# Patient Record
Sex: Female | Born: 1935 | Race: Black or African American | Hispanic: No | Smoking: Former smoker
Health system: Southern US, Community
[De-identification: ages and names within clinical notes are randomized; demographics above are authoritative.]

## PROBLEM LIST (undated history)

## (undated) DIAGNOSIS — I5022 Chronic systolic (congestive) heart failure: Secondary | ICD-10-CM

## (undated) DIAGNOSIS — R079 Chest pain, unspecified: Secondary | ICD-10-CM

## (undated) DIAGNOSIS — R05 Cough: Secondary | ICD-10-CM

## (undated) DIAGNOSIS — I2510 Atherosclerotic heart disease of native coronary artery without angina pectoris: Secondary | ICD-10-CM

## (undated) DIAGNOSIS — R053 Chronic cough: Secondary | ICD-10-CM

## (undated) DIAGNOSIS — E039 Hypothyroidism, unspecified: Secondary | ICD-10-CM

## (undated) DIAGNOSIS — I10 Essential (primary) hypertension: Secondary | ICD-10-CM

## (undated) DIAGNOSIS — I6529 Occlusion and stenosis of unspecified carotid artery: Secondary | ICD-10-CM

## (undated) DIAGNOSIS — K219 Gastro-esophageal reflux disease without esophagitis: Secondary | ICD-10-CM

## (undated) DIAGNOSIS — I442 Atrioventricular block, complete: Secondary | ICD-10-CM

## (undated) DIAGNOSIS — E785 Hyperlipidemia, unspecified: Secondary | ICD-10-CM

## (undated) HISTORY — DX: Essential (primary) hypertension: I10

## (undated) HISTORY — DX: Chronic cough: R05.3

## (undated) HISTORY — PX: LUMBAR SPINE SURGERY: SHX701

## (undated) HISTORY — DX: Gastro-esophageal reflux disease without esophagitis: K21.9

## (undated) HISTORY — DX: Hyperlipidemia, unspecified: E78.5

## (undated) HISTORY — PX: CARDIAC CATHETERIZATION: SHX172

## (undated) HISTORY — DX: Atherosclerotic heart disease of native coronary artery without angina pectoris: I25.10

## (undated) HISTORY — DX: Cough: R05

## (undated) HISTORY — DX: Atrioventricular block, complete: I44.2

## (undated) HISTORY — DX: Occlusion and stenosis of unspecified carotid artery: I65.29

## (undated) HISTORY — DX: Chest pain, unspecified: R07.9

## (undated) HISTORY — DX: Hypothyroidism, unspecified: E03.9

---

## 1987-02-11 DIAGNOSIS — I2510 Atherosclerotic heart disease of native coronary artery without angina pectoris: Secondary | ICD-10-CM

## 1987-02-11 HISTORY — DX: Atherosclerotic heart disease of native coronary artery without angina pectoris: I25.10

## 1987-02-11 HISTORY — PX: CORONARY ARTERY BYPASS GRAFT: SHX141

## 1997-10-09 ENCOUNTER — Other Ambulatory Visit: Admission: RE | Admit: 1997-10-09 | Discharge: 1997-10-09 | Payer: Self-pay | Admitting: Internal Medicine

## 1997-10-13 ENCOUNTER — Ambulatory Visit (HOSPITAL_COMMUNITY): Admission: RE | Admit: 1997-10-13 | Discharge: 1997-10-13 | Payer: Self-pay | Admitting: Internal Medicine

## 1998-05-04 ENCOUNTER — Ambulatory Visit (HOSPITAL_COMMUNITY): Admission: RE | Admit: 1998-05-04 | Discharge: 1998-05-04 | Payer: Self-pay | Admitting: Cardiovascular Disease

## 1999-04-29 ENCOUNTER — Encounter: Admission: RE | Admit: 1999-04-29 | Discharge: 1999-04-29 | Payer: Self-pay | Admitting: Internal Medicine

## 1999-04-29 ENCOUNTER — Encounter: Payer: Self-pay | Admitting: Internal Medicine

## 1999-07-24 ENCOUNTER — Ambulatory Visit (HOSPITAL_COMMUNITY): Admission: RE | Admit: 1999-07-24 | Discharge: 1999-07-24 | Payer: Self-pay | Admitting: Gastroenterology

## 2000-04-29 ENCOUNTER — Encounter: Admission: RE | Admit: 2000-04-29 | Discharge: 2000-04-29 | Payer: Self-pay | Admitting: Internal Medicine

## 2000-04-29 ENCOUNTER — Encounter: Payer: Self-pay | Admitting: Internal Medicine

## 2000-06-17 ENCOUNTER — Encounter: Payer: Self-pay | Admitting: Internal Medicine

## 2000-06-17 ENCOUNTER — Ambulatory Visit (HOSPITAL_COMMUNITY): Admission: RE | Admit: 2000-06-17 | Discharge: 2000-06-17 | Payer: Self-pay | Admitting: Internal Medicine

## 2000-11-12 ENCOUNTER — Other Ambulatory Visit: Admission: RE | Admit: 2000-11-12 | Discharge: 2000-11-12 | Payer: Self-pay | Admitting: Internal Medicine

## 2001-04-14 ENCOUNTER — Encounter: Payer: Self-pay | Admitting: Internal Medicine

## 2001-04-14 ENCOUNTER — Encounter: Admission: RE | Admit: 2001-04-14 | Discharge: 2001-04-14 | Payer: Self-pay | Admitting: Internal Medicine

## 2001-06-24 ENCOUNTER — Emergency Department (HOSPITAL_COMMUNITY): Admission: EM | Admit: 2001-06-24 | Discharge: 2001-06-24 | Payer: Self-pay

## 2001-08-30 ENCOUNTER — Encounter: Payer: Self-pay | Admitting: Ophthalmology

## 2001-09-02 ENCOUNTER — Ambulatory Visit (HOSPITAL_COMMUNITY): Admission: RE | Admit: 2001-09-02 | Discharge: 2001-09-02 | Payer: Self-pay | Admitting: Ophthalmology

## 2001-12-06 ENCOUNTER — Ambulatory Visit (HOSPITAL_COMMUNITY): Admission: RE | Admit: 2001-12-06 | Discharge: 2001-12-06 | Payer: Self-pay | Admitting: Ophthalmology

## 2002-02-10 HISTORY — PX: CATARACT EXTRACTION: SUR2

## 2002-02-10 HISTORY — PX: PACEMAKER PLACEMENT: SHX43

## 2002-04-19 ENCOUNTER — Encounter: Admission: RE | Admit: 2002-04-19 | Discharge: 2002-04-19 | Payer: Self-pay | Admitting: Internal Medicine

## 2002-04-19 ENCOUNTER — Encounter: Payer: Self-pay | Admitting: Internal Medicine

## 2002-08-25 ENCOUNTER — Inpatient Hospital Stay (HOSPITAL_COMMUNITY): Admission: AD | Admit: 2002-08-25 | Discharge: 2002-08-28 | Payer: Self-pay | Admitting: Cardiovascular Disease

## 2002-08-26 ENCOUNTER — Encounter: Payer: Self-pay | Admitting: Cardiology

## 2002-08-27 ENCOUNTER — Encounter: Payer: Self-pay | Admitting: Cardiovascular Disease

## 2003-05-08 ENCOUNTER — Encounter: Admission: RE | Admit: 2003-05-08 | Discharge: 2003-05-08 | Payer: Self-pay | Admitting: Internal Medicine

## 2003-05-24 ENCOUNTER — Emergency Department (HOSPITAL_COMMUNITY): Admission: EM | Admit: 2003-05-24 | Discharge: 2003-05-24 | Payer: Self-pay | Admitting: Emergency Medicine

## 2003-05-26 ENCOUNTER — Emergency Department (HOSPITAL_COMMUNITY): Admission: EM | Admit: 2003-05-26 | Discharge: 2003-05-26 | Payer: Self-pay | Admitting: Family Medicine

## 2004-03-19 ENCOUNTER — Encounter (INDEPENDENT_AMBULATORY_CARE_PROVIDER_SITE_OTHER): Payer: Self-pay | Admitting: Specialist

## 2004-03-19 ENCOUNTER — Ambulatory Visit (HOSPITAL_COMMUNITY): Admission: RE | Admit: 2004-03-19 | Discharge: 2004-03-19 | Payer: Self-pay | Admitting: Gastroenterology

## 2004-05-14 ENCOUNTER — Encounter: Admission: RE | Admit: 2004-05-14 | Discharge: 2004-05-14 | Payer: Self-pay | Admitting: Internal Medicine

## 2005-05-15 ENCOUNTER — Encounter: Admission: RE | Admit: 2005-05-15 | Discharge: 2005-05-15 | Payer: Self-pay | Admitting: Internal Medicine

## 2005-06-02 ENCOUNTER — Encounter: Payer: Self-pay | Admitting: Cardiovascular Disease

## 2005-06-18 ENCOUNTER — Inpatient Hospital Stay (HOSPITAL_BASED_OUTPATIENT_CLINIC_OR_DEPARTMENT_OTHER): Admission: RE | Admit: 2005-06-18 | Discharge: 2005-06-18 | Payer: Self-pay | Admitting: Cardiovascular Disease

## 2006-03-31 ENCOUNTER — Inpatient Hospital Stay (HOSPITAL_COMMUNITY): Admission: EM | Admit: 2006-03-31 | Discharge: 2006-04-01 | Payer: Self-pay | Admitting: Emergency Medicine

## 2006-04-14 ENCOUNTER — Ambulatory Visit: Payer: Self-pay | Admitting: *Deleted

## 2006-04-15 ENCOUNTER — Ambulatory Visit: Payer: Self-pay | Admitting: Family Medicine

## 2006-04-15 ENCOUNTER — Encounter (INDEPENDENT_AMBULATORY_CARE_PROVIDER_SITE_OTHER): Payer: Self-pay | Admitting: Family Medicine

## 2006-04-15 DIAGNOSIS — E785 Hyperlipidemia, unspecified: Secondary | ICD-10-CM

## 2006-04-15 DIAGNOSIS — Z950 Presence of cardiac pacemaker: Secondary | ICD-10-CM

## 2006-04-15 DIAGNOSIS — I25810 Atherosclerosis of coronary artery bypass graft(s) without angina pectoris: Secondary | ICD-10-CM

## 2006-04-15 DIAGNOSIS — H269 Unspecified cataract: Secondary | ICD-10-CM

## 2006-04-15 DIAGNOSIS — E039 Hypothyroidism, unspecified: Secondary | ICD-10-CM | POA: Insufficient documentation

## 2006-04-15 DIAGNOSIS — I119 Hypertensive heart disease without heart failure: Secondary | ICD-10-CM

## 2006-04-15 LAB — CONVERTED CEMR LAB
CO2: 26 meq/L (ref 19–32)
Chloride: 102 meq/L (ref 96–112)
Glucose, Bld: 102 mg/dL — ABNORMAL HIGH (ref 70–99)
HDL goal, serum: 40 mg/dL
Hgb A1c MFr Bld: 6.2 %
Sodium: 143 meq/L (ref 135–145)

## 2006-04-16 ENCOUNTER — Encounter (INDEPENDENT_AMBULATORY_CARE_PROVIDER_SITE_OTHER): Payer: Self-pay | Admitting: Family Medicine

## 2006-04-27 ENCOUNTER — Encounter: Payer: Self-pay | Admitting: *Deleted

## 2006-05-06 ENCOUNTER — Telehealth: Payer: Self-pay | Admitting: *Deleted

## 2006-05-07 ENCOUNTER — Telehealth (INDEPENDENT_AMBULATORY_CARE_PROVIDER_SITE_OTHER): Payer: Self-pay | Admitting: Family Medicine

## 2006-05-25 ENCOUNTER — Encounter (INDEPENDENT_AMBULATORY_CARE_PROVIDER_SITE_OTHER): Payer: Self-pay | Admitting: Family Medicine

## 2006-05-25 ENCOUNTER — Encounter: Admission: RE | Admit: 2006-05-25 | Discharge: 2006-05-25 | Payer: Self-pay | Admitting: Sports Medicine

## 2006-05-29 ENCOUNTER — Encounter (INDEPENDENT_AMBULATORY_CARE_PROVIDER_SITE_OTHER): Payer: Self-pay | Admitting: Family Medicine

## 2006-06-09 ENCOUNTER — Ambulatory Visit: Payer: Self-pay | Admitting: Family Medicine

## 2006-06-09 ENCOUNTER — Encounter (INDEPENDENT_AMBULATORY_CARE_PROVIDER_SITE_OTHER): Payer: Self-pay | Admitting: Family Medicine

## 2006-06-09 LAB — CONVERTED CEMR LAB
HDL: 39 mg/dL — ABNORMAL LOW (ref >39)
LDL Cholesterol: 63 mg/dL (ref 0–99)
Total CHOL/HDL Ratio: 3.2
Triglycerides: 107 mg/dL (ref <150)
VLDL: 21 mg/dL (ref 0–40)

## 2006-06-11 ENCOUNTER — Encounter (INDEPENDENT_AMBULATORY_CARE_PROVIDER_SITE_OTHER): Payer: Self-pay | Admitting: Family Medicine

## 2006-08-05 ENCOUNTER — Encounter (INDEPENDENT_AMBULATORY_CARE_PROVIDER_SITE_OTHER): Payer: Self-pay | Admitting: Family Medicine

## 2006-09-02 ENCOUNTER — Telehealth (INDEPENDENT_AMBULATORY_CARE_PROVIDER_SITE_OTHER): Payer: Self-pay | Admitting: *Deleted

## 2006-09-03 ENCOUNTER — Ambulatory Visit: Payer: Self-pay | Admitting: Family Medicine

## 2006-11-17 ENCOUNTER — Encounter (INDEPENDENT_AMBULATORY_CARE_PROVIDER_SITE_OTHER): Payer: Self-pay | Admitting: Family Medicine

## 2006-11-20 ENCOUNTER — Ambulatory Visit: Payer: Self-pay | Admitting: Family Medicine

## 2006-11-25 ENCOUNTER — Ambulatory Visit: Payer: Self-pay | Admitting: Family Medicine

## 2006-11-25 ENCOUNTER — Encounter (INDEPENDENT_AMBULATORY_CARE_PROVIDER_SITE_OTHER): Payer: Self-pay | Admitting: Family Medicine

## 2006-11-25 LAB — CONVERTED CEMR LAB: TSH: 1.893 microintl units/mL (ref 0.350–5.50)

## 2006-11-26 ENCOUNTER — Telehealth: Payer: Self-pay | Admitting: *Deleted

## 2006-11-27 ENCOUNTER — Encounter (INDEPENDENT_AMBULATORY_CARE_PROVIDER_SITE_OTHER): Payer: Self-pay | Admitting: Family Medicine

## 2006-12-29 ENCOUNTER — Encounter (INDEPENDENT_AMBULATORY_CARE_PROVIDER_SITE_OTHER): Payer: Self-pay | Admitting: Family Medicine

## 2007-01-26 ENCOUNTER — Ambulatory Visit: Payer: Self-pay | Admitting: Family Medicine

## 2007-01-28 ENCOUNTER — Encounter (INDEPENDENT_AMBULATORY_CARE_PROVIDER_SITE_OTHER): Payer: Self-pay | Admitting: Family Medicine

## 2007-03-24 ENCOUNTER — Encounter (INDEPENDENT_AMBULATORY_CARE_PROVIDER_SITE_OTHER): Payer: Self-pay | Admitting: Family Medicine

## 2007-03-24 DIAGNOSIS — E119 Type 2 diabetes mellitus without complications: Secondary | ICD-10-CM

## 2007-05-19 ENCOUNTER — Encounter (INDEPENDENT_AMBULATORY_CARE_PROVIDER_SITE_OTHER): Payer: Self-pay | Admitting: Family Medicine

## 2007-05-26 ENCOUNTER — Encounter: Admission: RE | Admit: 2007-05-26 | Discharge: 2007-05-26 | Payer: Self-pay | Admitting: Family Medicine

## 2007-06-09 ENCOUNTER — Encounter (INDEPENDENT_AMBULATORY_CARE_PROVIDER_SITE_OTHER): Payer: Self-pay | Admitting: Family Medicine

## 2007-06-09 ENCOUNTER — Ambulatory Visit: Payer: Self-pay | Admitting: Family Medicine

## 2007-06-09 LAB — CONVERTED CEMR LAB
ALT: 11 units/L (ref 0–35)
AST: 20 units/L (ref 0–37)
Alkaline Phosphatase: 90 units/L (ref 39–117)
BUN: 13 mg/dL (ref 6–23)
Calcium: 9.2 mg/dL (ref 8.4–10.5)
Creatinine, Ser: 0.88 mg/dL (ref 0.40–1.20)
Total Bilirubin: 0.4 mg/dL (ref 0.3–1.2)

## 2007-06-10 ENCOUNTER — Encounter (INDEPENDENT_AMBULATORY_CARE_PROVIDER_SITE_OTHER): Payer: Self-pay | Admitting: Family Medicine

## 2007-06-21 ENCOUNTER — Telehealth (INDEPENDENT_AMBULATORY_CARE_PROVIDER_SITE_OTHER): Payer: Self-pay | Admitting: Family Medicine

## 2007-06-22 ENCOUNTER — Encounter (INDEPENDENT_AMBULATORY_CARE_PROVIDER_SITE_OTHER): Payer: Self-pay | Admitting: Family Medicine

## 2007-06-28 ENCOUNTER — Telehealth: Payer: Self-pay | Admitting: *Deleted

## 2007-06-28 ENCOUNTER — Encounter (INDEPENDENT_AMBULATORY_CARE_PROVIDER_SITE_OTHER): Payer: Self-pay | Admitting: Family Medicine

## 2007-06-30 ENCOUNTER — Ambulatory Visit: Payer: Self-pay | Admitting: Family Medicine

## 2007-07-02 ENCOUNTER — Inpatient Hospital Stay (HOSPITAL_BASED_OUTPATIENT_CLINIC_OR_DEPARTMENT_OTHER): Admission: RE | Admit: 2007-07-02 | Discharge: 2007-07-02 | Payer: Self-pay | Admitting: Cardiovascular Disease

## 2007-07-16 ENCOUNTER — Encounter (INDEPENDENT_AMBULATORY_CARE_PROVIDER_SITE_OTHER): Payer: Self-pay | Admitting: Family Medicine

## 2007-07-19 ENCOUNTER — Ambulatory Visit (HOSPITAL_COMMUNITY): Admission: RE | Admit: 2007-07-19 | Discharge: 2007-07-20 | Payer: Self-pay | Admitting: Cardiovascular Disease

## 2007-08-09 ENCOUNTER — Ambulatory Visit: Payer: Self-pay | Admitting: Family Medicine

## 2007-08-16 ENCOUNTER — Ambulatory Visit: Payer: Self-pay | Admitting: Family Medicine

## 2007-08-16 DIAGNOSIS — R05 Cough: Secondary | ICD-10-CM

## 2007-08-25 ENCOUNTER — Encounter: Payer: Self-pay | Admitting: Family Medicine

## 2007-08-26 ENCOUNTER — Encounter (HOSPITAL_COMMUNITY): Admission: RE | Admit: 2007-08-26 | Discharge: 2007-11-29 | Payer: Self-pay | Admitting: Cardiovascular Disease

## 2007-09-20 ENCOUNTER — Telehealth: Payer: Self-pay | Admitting: *Deleted

## 2007-09-21 ENCOUNTER — Ambulatory Visit: Payer: Self-pay | Admitting: Family Medicine

## 2007-09-27 ENCOUNTER — Ambulatory Visit: Payer: Self-pay | Admitting: Family Medicine

## 2007-10-14 ENCOUNTER — Encounter: Payer: Self-pay | Admitting: Family Medicine

## 2007-10-14 ENCOUNTER — Ambulatory Visit: Payer: Self-pay | Admitting: Family Medicine

## 2007-10-14 LAB — CONVERTED CEMR LAB
OCCULT 1: NEGATIVE
OCCULT 2: NEGATIVE
OCCULT 3: NEGATIVE

## 2007-10-20 ENCOUNTER — Telehealth: Payer: Self-pay | Admitting: *Deleted

## 2007-10-21 ENCOUNTER — Ambulatory Visit: Payer: Self-pay | Admitting: Family Medicine

## 2007-10-22 ENCOUNTER — Telehealth (INDEPENDENT_AMBULATORY_CARE_PROVIDER_SITE_OTHER): Payer: Self-pay | Admitting: Family Medicine

## 2007-10-22 ENCOUNTER — Encounter: Admission: RE | Admit: 2007-10-22 | Discharge: 2007-10-22 | Payer: Self-pay | Admitting: Advanced Practice Midwife

## 2007-11-09 ENCOUNTER — Encounter: Payer: Self-pay | Admitting: Family Medicine

## 2007-11-12 ENCOUNTER — Ambulatory Visit: Payer: Self-pay | Admitting: Family Medicine

## 2007-11-15 ENCOUNTER — Telehealth (INDEPENDENT_AMBULATORY_CARE_PROVIDER_SITE_OTHER): Payer: Self-pay | Admitting: *Deleted

## 2007-11-23 ENCOUNTER — Encounter: Payer: Self-pay | Admitting: Family Medicine

## 2007-11-30 ENCOUNTER — Encounter (HOSPITAL_COMMUNITY): Admission: RE | Admit: 2007-11-30 | Discharge: 2008-02-10 | Payer: Self-pay | Admitting: Cardiovascular Disease

## 2007-12-13 ENCOUNTER — Ambulatory Visit: Payer: Self-pay | Admitting: Family Medicine

## 2007-12-13 ENCOUNTER — Telehealth (INDEPENDENT_AMBULATORY_CARE_PROVIDER_SITE_OTHER): Payer: Self-pay | Admitting: *Deleted

## 2007-12-24 ENCOUNTER — Ambulatory Visit: Payer: Self-pay | Admitting: Family Medicine

## 2007-12-24 LAB — CONVERTED CEMR LAB: Hgb A1c MFr Bld: 5.9 %

## 2008-01-26 ENCOUNTER — Ambulatory Visit: Payer: Self-pay | Admitting: Family Medicine

## 2008-01-26 ENCOUNTER — Telehealth: Payer: Self-pay | Admitting: *Deleted

## 2008-02-11 ENCOUNTER — Encounter (HOSPITAL_COMMUNITY): Admission: RE | Admit: 2008-02-11 | Discharge: 2008-04-10 | Payer: Self-pay | Admitting: Cardiovascular Disease

## 2008-05-26 ENCOUNTER — Encounter: Admission: RE | Admit: 2008-05-26 | Discharge: 2008-05-26 | Payer: Self-pay | Admitting: Family Medicine

## 2008-07-11 ENCOUNTER — Encounter: Payer: Self-pay | Admitting: Family Medicine

## 2008-07-12 ENCOUNTER — Telehealth: Payer: Self-pay | Admitting: *Deleted

## 2008-07-12 ENCOUNTER — Ambulatory Visit: Payer: Self-pay | Admitting: Family Medicine

## 2008-07-12 LAB — CONVERTED CEMR LAB: Hgb A1c MFr Bld: 6 %

## 2008-07-27 ENCOUNTER — Ambulatory Visit: Payer: Self-pay | Admitting: Family Medicine

## 2008-08-01 ENCOUNTER — Ambulatory Visit: Payer: Self-pay | Admitting: Internal Medicine

## 2008-08-01 DIAGNOSIS — I219 Acute myocardial infarction, unspecified: Secondary | ICD-10-CM | POA: Insufficient documentation

## 2008-08-31 ENCOUNTER — Ambulatory Visit: Payer: Self-pay | Admitting: Family Medicine

## 2008-09-07 ENCOUNTER — Encounter: Payer: Self-pay | Admitting: Family Medicine

## 2008-09-22 ENCOUNTER — Ambulatory Visit: Payer: Self-pay | Admitting: Internal Medicine

## 2008-11-14 ENCOUNTER — Ambulatory Visit: Payer: Self-pay | Admitting: Family Medicine

## 2008-11-14 ENCOUNTER — Telehealth: Payer: Self-pay | Admitting: Family Medicine

## 2008-11-16 ENCOUNTER — Ambulatory Visit: Payer: Self-pay | Admitting: Internal Medicine

## 2008-11-16 ENCOUNTER — Encounter: Payer: Self-pay | Admitting: Family Medicine

## 2008-11-17 ENCOUNTER — Encounter: Admission: RE | Admit: 2008-11-17 | Discharge: 2008-11-17 | Payer: Self-pay | Admitting: Urology

## 2008-12-18 ENCOUNTER — Encounter: Payer: Self-pay | Admitting: Family Medicine

## 2008-12-18 ENCOUNTER — Ambulatory Visit: Payer: Self-pay | Admitting: Family Medicine

## 2008-12-18 LAB — CONVERTED CEMR LAB
AST: 21 units/L (ref 0–37)
BUN: 11 mg/dL (ref 6–23)
Calcium: 9 mg/dL (ref 8.4–10.5)
Chloride: 102 meq/L (ref 96–112)
Creatinine, Ser: 0.85 mg/dL (ref 0.40–1.20)
TSH: 2.808 microintl units/mL (ref 0.350–4.500)
Total Bilirubin: 0.5 mg/dL (ref 0.3–1.2)

## 2008-12-19 ENCOUNTER — Encounter: Payer: Self-pay | Admitting: Family Medicine

## 2009-01-01 ENCOUNTER — Ambulatory Visit: Payer: Self-pay | Admitting: Internal Medicine

## 2009-02-10 DIAGNOSIS — I6529 Occlusion and stenosis of unspecified carotid artery: Secondary | ICD-10-CM

## 2009-02-10 HISTORY — DX: Occlusion and stenosis of unspecified carotid artery: I65.29

## 2009-05-28 ENCOUNTER — Encounter: Payer: Self-pay | Admitting: Family Medicine

## 2009-05-28 ENCOUNTER — Encounter: Admission: RE | Admit: 2009-05-28 | Discharge: 2009-05-28 | Payer: Self-pay | Admitting: Family Medicine

## 2009-05-29 ENCOUNTER — Encounter: Payer: Self-pay | Admitting: Family Medicine

## 2009-05-29 DIAGNOSIS — R0989 Other specified symptoms and signs involving the circulatory and respiratory systems: Secondary | ICD-10-CM | POA: Insufficient documentation

## 2009-06-07 ENCOUNTER — Ambulatory Visit: Payer: Self-pay | Admitting: Surgery

## 2009-06-07 ENCOUNTER — Encounter: Payer: Self-pay | Admitting: Family Medicine

## 2009-06-07 ENCOUNTER — Ambulatory Visit (HOSPITAL_COMMUNITY): Admission: RE | Admit: 2009-06-07 | Discharge: 2009-06-07 | Payer: Self-pay | Admitting: Family Medicine

## 2009-06-11 ENCOUNTER — Telehealth: Payer: Self-pay | Admitting: Family Medicine

## 2009-06-14 ENCOUNTER — Encounter: Payer: Self-pay | Admitting: Family Medicine

## 2009-07-24 ENCOUNTER — Encounter: Payer: Self-pay | Admitting: Family Medicine

## 2009-08-07 ENCOUNTER — Ambulatory Visit: Payer: Self-pay | Admitting: Family Medicine

## 2009-08-07 DIAGNOSIS — R209 Unspecified disturbances of skin sensation: Secondary | ICD-10-CM | POA: Insufficient documentation

## 2009-08-07 DIAGNOSIS — I739 Peripheral vascular disease, unspecified: Secondary | ICD-10-CM

## 2009-08-07 DIAGNOSIS — I779 Disorder of arteries and arterioles, unspecified: Secondary | ICD-10-CM | POA: Insufficient documentation

## 2009-08-07 DIAGNOSIS — G4700 Insomnia, unspecified: Secondary | ICD-10-CM | POA: Insufficient documentation

## 2009-08-09 ENCOUNTER — Encounter: Payer: Self-pay | Admitting: Family Medicine

## 2009-09-17 ENCOUNTER — Ambulatory Visit: Payer: Self-pay | Admitting: Surgery

## 2009-09-25 ENCOUNTER — Encounter: Payer: Self-pay | Admitting: Family Medicine

## 2009-09-25 LAB — CONVERTED CEMR LAB
ALT: 10 units/L
AST: 23 units/L
Bilirubin, Direct: 0.1 mg/dL
Chloride: 99 meq/L
Glucose, Bld: 85 mg/dL
HCT: 37.9 %
Hemoglobin: 12.7 g/dL
Indirect Bilirubin: 0.4 mg/dL
MCHC: 33.5 g/dL
Platelets: 281 10*3/uL
Potassium: 4.1 meq/L
Sodium: 138 meq/L

## 2009-09-26 ENCOUNTER — Encounter: Payer: Self-pay | Admitting: Family Medicine

## 2009-11-23 ENCOUNTER — Ambulatory Visit: Payer: Self-pay | Admitting: Family Medicine

## 2009-11-24 ENCOUNTER — Encounter: Payer: Self-pay | Admitting: Internal Medicine

## 2009-12-19 ENCOUNTER — Telehealth: Payer: Self-pay | Admitting: Family Medicine

## 2009-12-24 ENCOUNTER — Ambulatory Visit: Payer: Self-pay | Admitting: Cardiovascular Disease

## 2009-12-24 ENCOUNTER — Encounter: Payer: Self-pay | Admitting: Family Medicine

## 2009-12-24 LAB — CONVERTED CEMR LAB
HDL: 56 mg/dL
Triglycerides: 83 mg/dL

## 2010-01-08 ENCOUNTER — Encounter: Payer: Self-pay | Admitting: Family Medicine

## 2010-01-14 ENCOUNTER — Ambulatory Visit: Payer: Self-pay | Admitting: Internal Medicine

## 2010-02-21 ENCOUNTER — Ambulatory Visit
Admission: RE | Admit: 2010-02-21 | Discharge: 2010-02-21 | Payer: Self-pay | Source: Home / Self Care | Attending: Family Medicine | Admitting: Family Medicine

## 2010-02-21 ENCOUNTER — Ambulatory Visit (HOSPITAL_COMMUNITY)
Admission: RE | Admit: 2010-02-21 | Discharge: 2010-02-21 | Payer: Self-pay | Source: Home / Self Care | Admitting: Family Medicine

## 2010-02-21 ENCOUNTER — Encounter: Payer: Self-pay | Admitting: Sports Medicine

## 2010-02-21 ENCOUNTER — Other Ambulatory Visit: Payer: Self-pay

## 2010-02-21 DIAGNOSIS — R079 Chest pain, unspecified: Secondary | ICD-10-CM | POA: Insufficient documentation

## 2010-03-08 ENCOUNTER — Encounter: Payer: Self-pay | Admitting: Family Medicine

## 2010-03-08 ENCOUNTER — Ambulatory Visit: Admission: RE | Admit: 2010-03-08 | Discharge: 2010-03-08 | Payer: Self-pay | Source: Home / Self Care

## 2010-03-08 ENCOUNTER — Encounter
Admission: RE | Admit: 2010-03-08 | Discharge: 2010-03-08 | Payer: Self-pay | Source: Home / Self Care | Attending: Family Medicine | Admitting: Family Medicine

## 2010-03-08 DIAGNOSIS — M899 Disorder of bone, unspecified: Secondary | ICD-10-CM | POA: Insufficient documentation

## 2010-03-08 DIAGNOSIS — R634 Abnormal weight loss: Secondary | ICD-10-CM | POA: Insufficient documentation

## 2010-03-08 DIAGNOSIS — R61 Generalized hyperhidrosis: Secondary | ICD-10-CM | POA: Insufficient documentation

## 2010-03-08 DIAGNOSIS — M949 Disorder of cartilage, unspecified: Secondary | ICD-10-CM

## 2010-03-08 LAB — CONVERTED CEMR LAB
ALT: 22 units/L
AST: 30 units/L
Alkaline Phosphatase: 109 units/L
Creatinine, Ser: 0.9 mg/dL
Total Bilirubin: 0.4 mg/dL

## 2010-03-11 ENCOUNTER — Telehealth: Payer: Self-pay | Admitting: Family Medicine

## 2010-03-11 LAB — CONVERTED CEMR LAB
CO2: 27 meq/L (ref 19–32)
Calcium: 9.1 mg/dL (ref 8.4–10.5)
Chloride: 100 meq/L (ref 96–112)
Creatinine, Ser: 1.03 mg/dL (ref 0.40–1.20)
Glucose, Bld: 82 mg/dL (ref 70–99)
Sodium: 136 meq/L (ref 135–145)

## 2010-03-14 ENCOUNTER — Telehealth: Payer: Self-pay | Admitting: Family Medicine

## 2010-03-14 NOTE — Miscellaneous (Signed)
Summary: labs  Clinical Lists Changes  Orders: Added new Test order of A1C-[facility] [phone]) - Signed Added new Test order of Direct LDL-[facility] [id number]) - Signed

## 2010-03-14 NOTE — Consult Note (Signed)
Summary: [facility]   Imported By: De Nurse 06/29/2009 15:58:07  _____________________________________________________________________  External Attachment:    Type:   Image     Comment:   External Document

## 2010-03-14 NOTE — Procedures (Signed)
Summary: pacer check/medtronic   Current Medications (verified): 1)  Amlodipine Besylate 10 Mg Tabs (Amlodipine Besylate) .... One By Mouth Daily 2)  Aspir-81 81 Mg Tbec (Aspirin) .... Take 1 Tablet By Mouth Every Morning 3)  Metformin Hcl 500 Mg Tabs (Metformin Hcl) .... Take 1/2  Tablet By Mouth Once A Day 4)  Potassium Chloride Cr 20 Meq Tbcr (Potassium Chloride) .... Take 1 Tablet By Mouth Every Morning 5)  Synthroid 10 Mcg Tabs (Levothyroxine Sodium) .... Take 1 Tablet By Mouth Once A Day 6)  Zocor 40 Mg Tabs (Simvastatin) .... Take 1 Tablet By Mouth At Bedtime 7)  Plavix 75 Mg  Tabs (Clopidogrel Bisulfate) .Marland Kitchen.. 1 By Mouth Daily (Nasher Prescribes) 8)  Ranexa 500 Mg  Tb12 (Ranolazine) .Marland Kitchen.. 1 By Mouth Two Times A Day (Nasher Prescribes) 9)  Imdur 60 Mg Xr24h-Tab (Isosorbide Mononitrate) .... 1/2 Tablet By Mouth Daily 10)  Furosemide 40 Mg Tabs (Furosemide) .Marland Kitchen.. 1 By Mouth Daily As Needed Swelling 11)  Bystolic 10 Mg  Tabs (Nebivolol Hcl) .... One Tablet Daily 12)  Trazodone Hcl 50 Mg Tabs (Trazodone Hcl) .... Take 1 Tablet At Bedtime As Needed For Sleep  Allergies (verified): No Known Drug Allergies  PPM Specifications Following MD:  Hillis Range, MD     Referring MD:  Chip Boer PPM Vendor:  Medtronic     PPM Model Number:  [id number]     PPM Serial Number:  [id number] PPM DOI:  08/26/2002      Lead 1    Location: RA     DOI: 08/26/2002     Model #: [id number]     Serial #: [id number]     Status: active Lead 2    Location: RV     DOI: 08/26/2002     Model #: [id number]     Serial #: [id number]     Status: active  Magnet Response Rate:  BOL 85 ERI 65  Indications:  CHB   PPM Follow Up Remote Check?  No Battery Voltage:  2.69 V     Battery Est. Longevity:  15 months     Pacer Dependent:  Yes       PPM Device Measurements Atrium  Amplitude: 1.4 mV, Impedance: 535 ohms, Threshold: 0.75 V at 0.4 msec Right Ventricle  Impedance: 722 ohms, Threshold: 0.75 V at 0.4 msec  Episodes MS Episodes:   0     Percent Mode Switch:  0     Coumadin:  No Ventricular High Rate:  0     Atrial Pacing:  93%     Ventricular Pacing:  99.9%  Parameters Mode:  DDDR     Lower Rate Limit:  60     Upper Rate Limit:  130 Paced AV Delay:  250     Sensed AV Delay:  200 Next Cardiology Appt Due:  04/11/2010 Tech Comments:  RV reprogrammed 2.5@ 0.4.  Battery nearing ERI.  ROV 3 months with Dr. Johney Frame. Altha Harm, LPN  January 14, 2010 12:16 PM

## 2010-03-14 NOTE — Progress Notes (Signed)
Summary: refill  Phone Note Refill Request Call back at Home Phone [phone] Message from:  Patient  Refills Requested: Medication #1:  SYNTHROID 50 MCG TABS Take 1 tablet by mouth once a day [facility] [place]  Initial call taken by: De Nurse,  December 19, 2009 2:58 PM    Prescriptions: SYNTHROID 50 MCG TABS (LEVOTHYROXINE SODIUM) Take 1 tablet by mouth once a day  #30 x 1   Entered and Authorized by:   Milinda Antis MD   Signed by:   Milinda Antis MD on 12/19/2009   Method used:   Electronically to        [facility] [address] (retail)       [address]       [place], [place]  [id number]       Ph: [phone]       Fax: [phone]   RxID:   [id number]

## 2010-03-14 NOTE — Cardiovascular Report (Signed)
Summary: Office Visit   Office Visit   Imported By: Roderic Ovens 01/21/2010 13:16:55  _____________________________________________________________________  External Attachment:    Type:   Image     Comment:   External Document

## 2010-03-14 NOTE — Assessment & Plan Note (Signed)
Summary: night sweats/eo   Vital Signs:  Patient profile:   77 year old female Height:      62 inches Weight:      136.2 pounds Temp:     97.6 degrees F oral Pulse rate:   82 / minute BP sitting:   134 / 62  (right arm)  Vitals Entered By: Arlyss Repress CMA, (March 08, 2010 10:44 AM) CC: night sweats x 1 mos, DM Is Patient Diabetic? Yes Pain Assessment Patient in pain? no        Primary Care Provider:  Milinda Antis MD  CC:  night sweats x 1 mos and DM.  History of Present Illness:    Night sweats x 1 month, started before christmas, and has been on and off, unsure if any changes due to foods and no new medications, clothing and sheets are drenched, only had 1 night this week without symptoms, has had in the past. Sleep improved no meds used    --  no cough, no fever or chills, weight down 3lbs, Bowels occ blood in stools after 3-4 BM approx 1 year , no dysuria, no blood , no bleeding from vagina, no recent travel outside of country, daughter having Chemo for breast cancer , has been traveling to [facility]  Does not want another colonscopy  DM- no hypoglycemic events, tolearting metformin , CBG none > 200, follows with podiatrist  Reviewd cardiology note  Habits & Providers  Alcohol-Tobacco-Diet     Tobacco Status: quit  Current Medications (verified): 1)  Amlodipine Besylate 10 Mg Tabs (Amlodipine Besylate) .... One By Mouth Daily 2)  Aspir-81 81 Mg Tbec (Aspirin) .... Take 1 Tablet By Mouth Every Morning 3)  Metformin Hcl 500 Mg Tabs (Metformin Hcl) .... Take 1/2  Tablet By Mouth Once A Day 4)  Potassium Chloride Cr 20 Meq Tbcr (Potassium Chloride) .... Take 1 Tablet By Mouth Every Morning 5)  Synthroid 10 Mcg Tabs (Levothyroxine Sodium) .... Take 1 Tablet By Mouth Once A Day 6)  Zocor 40 Mg Tabs (Simvastatin) .... Take 1 Tablet By Mouth At Bedtime 7)  Plavix 75 Mg  Tabs (Clopidogrel Bisulfate) .Marland Kitchen.. 1 By Mouth Daily (Nasher Prescribes) 8)  Ranexa 500 Mg   Tb12 (Ranolazine) .Marland Kitchen.. 1 By Mouth Two Times A Day (Nasher Prescribes) 9)  Imdur 60 Mg Xr24h-Tab (Isosorbide Mononitrate) .Marland Kitchen.. 1 Tablet By Mouth Daily 10)  Furosemide 40 Mg Tabs (Furosemide) .Marland Kitchen.. 1 By Mouth Daily As Needed Swelling 11)  Bystolic 10 Mg  Tabs (Nebivolol Hcl) .... One Tablet Daily 12)  Simethicone 125 Mg Chew (Simethicone) .... One Tab By Mouth Three Times A Day 13)  Oscal 500/200 D-3 500-200 Mg-Unit Tabs (Calcium Carbonate-Vitamin D) .Marland Kitchen.. 1 Tab By Mouth Two Times A Day  Allergies (verified): No Known Drug Allergies  Past History:  Past Medical History: Last updated: 08/07/2009 Triple Bypass 1989 Pacemaker 2004 Outpatient cardiolyte 03/2006 normal Sees Dr. Melburn Popper for cardiology. HTN Myocardial Infarction Chronic cough..........................................................................Marland KitchenWert     - onset around 2005 Carotid artery disease Diabetes Mellitus  Family History: Mom died 47 had HTN Dad died 70 unknown cause Brothers died 85, 62 of liver cancer and colon cancer. Allergies mother in fall  Daugther-breast cancer  Social History: Smoking Status:  quit  Review of Systems       per HPI  Physical Exam  General:  Well-developed,well-nourished,in no acute distress; alert,appropriate and cooperative throughout examination well appearing Vital signs noted  Eyes:  PERRL, non icteric Mouth:  pharynx pink and moist.   Neck:  supple.  no neck mass, no thyromegaly cartoid bruit Left Lungs:  CTAB Heart:  RRR, 3/6 SEM  Diabetes Management Exam:    Foot Exam (with socks and/or shoes not present):       Sensory-Pinprick/Light touch:          Left medial foot (L-4): normal          Left dorsal foot (L-5): normal          Left lateral foot (S-1): normal          Right medial foot (L-4): normal          Right dorsal foot (L-5): normal          Right lateral foot (S-1): normal       Sensory-Monofilament:          Left foot: diminished          Right  foot: diminished       Sensory-other: dinished over bilat toes- callus       Inspection:          Left foot: abnormal             Comments: callus formation bilat bunions bilat          Right foot: abnormal       Nails:          Left foot: thickened          Right foot: normal    Foot Exam by Podiatrist:       Date: 07/16/2009       Results: no diabetic findings       Done by: [facility]- Dr. Charlsie Merles   Impression & Recommendations:  Problem # 1:  NIGHT SWEATS (ICD-780.8) Assessment New  May be change with age, no history to suggest new infection and little weight loss associated regarding malignancy. History of hemorroids regarding blood in stool with multiple or hard BM which is unchnaged, pt declined colonscopy at this time. Remote history of tobacco use, will obtain CXR, check TSH, BMET, Vit D level. No specific findings on exam. Hold on PPD and monitor no risk factors for TB Orders: CXR- 2view (CXR) [facility]- Est  Level 4 ([address])  Problem # 2:  LOSS OF WEIGHT (ICD-783.21) Assessment: New Weight down 5-6 pounds in 1 year unintentional not very concerning for malignancy, see above Mammogram neg Orders: CXR- 2view (CXR) [facility]- Est  Level 4 (60454)  Problem # 3:  DIABETES MELLITUS (ICD-250.00) Assessment: Improved A1C 5.9% continue current regimine Her updated medication list for this problem includes:    Aspir-81 81 Mg Tbec (Aspirin) .Marland Kitchen... Take 1 tablet by mouth every morning    Metformin Hcl 500 Mg Tabs (Metformin hcl) .Marland Kitchen... Take 1/2  tablet by mouth once a day  Orders: A1C-[facility] (09811) [facility]- Est  Level 4 (91478)  Labs Reviewed: Creat: 0.94 (09/25/2009)     Last Eye Exam: normal (07/27/2008) Reviewed HgBA1c results: 5.8 (12/18/2008)  6.0 (07/12/2008)  Complete Medication List: 1)  Amlodipine Besylate 10 Mg Tabs (Amlodipine besylate) .... One by mouth daily 2)  Aspir-81 81 Mg Tbec (Aspirin) .... Take 1 tablet by mouth every morning 3)  Metformin Hcl 500 Mg Tabs  (Metformin hcl) .... Take 1/2  tablet by mouth once a day 4)  Potassium Chloride Cr 20 Meq Tbcr (potassium Chloride)  .... Take 1 tablet by mouth every morning 5)  Synthroid 10 Mcg Tabs (levothyroxine Sodium)  .Marland KitchenMarland KitchenMarland Kitchen  Take 1 tablet by mouth once a day 6)  Zocor 40 Mg Tabs (Simvastatin) .... Take 1 tablet by mouth at bedtime 7)  Plavix 75 Mg Tabs (Clopidogrel bisulfate) .Marland Kitchen.. 1 by mouth daily (nasher prescribes) 8)  Ranexa 500 Mg Tb12 (Ranolazine) .Marland Kitchen.. 1 by mouth two times a day (nasher prescribes) 9)  Imdur 60 Mg Xr24h-tab (Isosorbide mononitrate) .Marland Kitchen.. 1 tablet by mouth daily 10)  Furosemide 40 Mg Tabs (Furosemide) .Marland Kitchen.. 1 by mouth daily as needed swelling 11)  Bystolic 10 Mg Tabs (Nebivolol hcl) .... One tablet daily 12)  Simethicone 125 Mg Chew (Simethicone) .... One tab by mouth three times a day 13)  Oscal 500/200 D-3 500-200 Mg-unit Tabs (Calcium carbonate-vitamin d) .Marland Kitchen.. 1 tab by mouth two times a day  Other Orders: Basic Met-[facility] [id number]) TSH-[facility] [id number]) Vit D, 25 OH-[facility] ([address]36)  Patient Instructions: 1)  Start the Vitamin D and calcium 2)  I will call you with lab results on Monday 3)  Get the x-ray when you can 4)  Next visit in 1 month to f/u night sweats Prescriptions: OSCAL 500/200 D-3 500-200 MG-UNIT TABS (CALCIUM CARBONATE-VITAMIN D) 1 tab by mouth two times a day  #60 x 12   Entered and Authorized by:   Milinda Antis MD   Signed by:   Milinda Antis MD on 03/08/2010   Method used:   Electronically to        [facility] (retail)       [address]       [place], [place]  [id number]       Ph: [phone]       Fax: [phone]   RxID:   [id number]    Orders Added: 1)  A1C-[facility] [83036] 2)  Basic Met-[facility] [57322-02542] 3)  TSH-[facility] [70623-76283] 4)  Vit D, 25 OH-[facility] [15176-16073] 5)  CXR- 2view [CXR] 6)  [facility]- Est  Level 4 [71062]     Prevention & Chronic Care Immunizations   Influenza vaccine: Fluvax MCR   (11/23/2009)   Influenza vaccine due: 11/20/2007    Tetanus booster: 11/25/2006: Tdap   Tetanus booster due: 11/24/2016    Pneumococcal vaccine: Not documented   Pneumococcal vaccine deferral: Not indicated  (03/08/2010)    H. zoster vaccine: Not documented  Colorectal Screening   Hemoccult: Not documented    Colonoscopy: Adenomatous Polyp  (04/20/2003)   Colonoscopy due: 04/19/2013  Other Screening   Pap smear: Not documented    Mammogram: ASSESSMENT: Negative - BI-RADS 1^MM DIGITAL SCREENING  (05/28/2009)   Mammogram due: 05/25/2008    DXA bone density scan: Not documented   Smoking status: quit  (03/08/2010)  Diabetes Mellitus   HgbA1C: 5.8  (12/18/2008)   Hemoglobin A1C due: 07/16/2006    Eye exam: normal  (07/27/2008)   Eye exam due: 08/2009    Foot exam: yes  (03/08/2010)   High risk foot: Not documented   Foot care education: Not documented   Foot exam due: 04/15/2007    Urine microalbumin/creatinine ratio: Not documented    Diabetes flowsheet reviewed?: Yes   Progress toward A1C goal: At goal  Lipids   Total Cholesterol: 137  (12/24/2009)   LDL: 64  (12/24/2009)   LDL Direct: 58  (12/18/2008)   HDL: 56  (12/24/2009)   Triglycerides: 83  (12/24/2009)    SGOT (AST): 30  (03/08/2010)   SGPT (ALT): 22  (03/08/2010)   Alkaline phosphatase: 109  (03/08/2010)   Total bilirubin: .4  (03/08/2010)  Lipid flowsheet reviewed?: Yes   Progress toward LDL goal: At goal  Hypertension   Last Blood Pressure: 134 / 62  (03/08/2010)   Serum creatinine: 0.9  (03/08/2010)   Serum potassium 3.8  (03/08/2010)    Hypertension flowsheet reviewed?: Yes   Progress toward BP goal: At goal  Self-Management Support :   Personal Goals (by the next clinic visit) :     Personal A1C goal: 7  (12/18/2008)     Personal blood pressure goal: 130/80  (12/18/2008)     Personal LDL goal: 70  (12/18/2008)    Diabetes self-management support: Not documented    Hypertension  self-management support: Not documented    Lipid self-management support: Not documented    Given Pneumonia shot in 2004.   Appended Document: A1c  5.9 %    Lab Visit  Laboratory Results   Blood Tests   Date/Time Received: March 08, 2010 11:25 AM  Date/Time Reported: March 08, 2010 2:06 PM   HGBA1C: 5.9%   (Normal Range: Non-Diabetic - 3-6%   Control Diabetic - 6-8%)  Comments: ...............test performed by......Marland KitchenBonnie A. [place], MLS (ASCP)cm    Orders Today:

## 2010-03-14 NOTE — Miscellaneous (Signed)
Summary: Orders Update  Clinical Lists Changes  Orders: Added new Referral order of Vascular Clinic (Vascular) - Signed 

## 2010-03-14 NOTE — Letter (Signed)
Summary: Generic Letter  [facility]  [address]   [place], [place] [address]   Phone: [phone]  Fax: [phone]    06/14/2009  Sydney Moore [address] [place], [place]  [address]  Dear [name],   Recently you had a Carotid Doppler Ultrasound done for your neck vessels. The results show that you have stenosis and plaque build-up in your carotid vessels. The right side has 60-80% stenosis or narrowing and the left side 40-60%. Please give me a call to discuss any intervention you may want. You are currently on the correct medications for lowering cholesterol; however, some patients opt to have a consultation with a surgeon for correction of the narrowing.  I have attempted to reach you via telephone but missed you.  Please feel free to contact me at your convenience.          Sincerely,   Milinda Antis MD  Appended Document: Generic Letter mailed.

## 2010-03-14 NOTE — Miscellaneous (Signed)
Summary: Orders Update  Clinical Lists Changes  Problems: Added new problem of CAROTID BRUIT, LEFT (ICD-785.9) Orders: Added new Test order of Carotid Doppler (Carotid doppler) - Signed  Pt seen at [facility], concern for new carotid bruit on left side. As I am rotating with Dr. Elease Hashimoto he informed me he would like Carotid dopplers done. Will send pt for screening. FLP to be done at Dr. Elease Hashimoto office.

## 2010-03-14 NOTE — Consult Note (Signed)
Summary: [facility]   Imported By: Knox Royalty 12/27/2009 16:41:20  _____________________________________________________________________  External Attachment:    Type:   Image     Comment:   External Document

## 2010-03-14 NOTE — Assessment & Plan Note (Signed)
Summary: flu shot/bmc  Nurse Visit   Vital Signs:  Patient profile:   77 year old female Temp:     98.3 degrees F  Vitals Entered By: Theresia Lo RN (November 23, 2009 4:16 PM)  Allergies: No Known Drug Allergies  Immunizations Administered:  Influenza Vaccine # 1:    Vaccine Type: Fluvax MCR    Site: left deltoid    Mfr: GlaxoSmithKline    Dose: 0.5 ml    Route: IM    Given by: Theresia Lo RN    Exp. Date: 08/07/2010    Lot #: [id number]    VIS given: 09/04/09 version given November 23, 2009.  Flu Vaccine Consent Questions:    Do you have a history of severe allergic reactions to this vaccine? no    Any prior history of allergic reactions to egg and/or gelatin? no    Do you have a sensitivity to the preservative Thimersol? no    Do you have a past history of Guillan-Barre Syndrome? no    Do you currently have an acute febrile illness? no    Have you ever had a severe reaction to latex? no    Vaccine information given and explained to patient? yes    Are you currently pregnant? no  Orders Added: 1)  Influenza Vaccine MCR [00025] 2)  Administration Flu vaccine - MCR [G0008]

## 2010-03-14 NOTE — Progress Notes (Signed)
Summary: Carotid Doppler results  Phone Note Outgoing Call   Call placed by: Milinda Antis MD,  Jun 11, 2009 1:38 PM Details for Reason: Carotid Doppler results Summary of Call: Left a message on answering machine. Carotid doppler results, Right side  60-80% stenosis, will discuss surgical intervention.Pt assymptomatic

## 2010-03-14 NOTE — Miscellaneous (Signed)
Summary: Device preload  Clinical Lists Changes  Observations: Added new observation of PPM INDICATN: CHB (11/24/2009 14:42) Added new observation of MAGNET RTE: BOL 85 ERI 65 (11/24/2009 14:42) Added new observation of PPMLEADSTAT2: active (11/24/2009 14:42) Added new observation of PPMLEADSER2: 409811  (11/24/2009 14:42) Added new observation of PPMLEADMOD2: 4470  (11/24/2009 14:42) Added new observation of PPMLEADDOI2: 08/26/2002  (11/24/2009 14:42) Added new observation of PPMLEADLOC2: RV  (11/24/2009 14:42) Added new observation of PPMLEADSTAT1: active  (11/24/2009 14:42) Added new observation of PPMLEADSER1: 914782  (11/24/2009 14:42) Added new observation of PPMLEADMOD1: 4469  (11/24/2009 14:42) Added new observation of PPMLEADDOI1: 08/26/2002  (11/24/2009 14:42) Added new observation of PPMLEADLOC1: RA  (11/24/2009 14:42) Added new observation of PPM DOI: 08/26/2002  (11/24/2009 14:42) Added new observation of PPM SERL#: [id number]  (11/24/2009 14:42) Added new observation of PPM MODL#: [id number]  (11/24/2009 62:95) Added new observation of PACEMAKERMFG: Medtronic  (11/24/2009 14:42) Added new observation of PPM REFER MD: Chip Boer  (11/24/2009 14:42) Added new observation of PACEMAKER MD: Hillis Range, MD  (11/24/2009 14:42)      PPM Specifications Following MD:  Hillis Range, MD     Referring MD:  Chip Boer PPM Vendor:  Medtronic     PPM Model Number:  [id number]     PPM Serial Number:  [id number] PPM DOI:  08/26/2002      Lead 1    Location: RA     DOI: 08/26/2002     Model #: 4469     Serial #: [id number]     Status: active Lead 2    Location: RV     DOI: 08/26/2002     Model #: 4470     Serial #: [id number]     Status: active  Magnet Response Rate:  BOL 85 ERI 65  Indications:  CHB

## 2010-03-14 NOTE — Consult Note (Signed)
Summary: [facility]  [facility]   Imported By: Clydell Hakim 08/08/2009 16:47:31  _____________________________________________________________________  External Attachment:    Type:   Image     Comment:   External Document

## 2010-03-14 NOTE — Assessment & Plan Note (Signed)
Summary: f/u doppler,df   Vital Signs:  Patient profile:   77 year old female Height:      62 inches Weight:      139.5 pounds BMI:     25.61 Temp:     98.0 degrees F oral Pulse rate:   84 / minute BP sitting:   162 / 70  (left arm) Cuff size:   regular  Vitals Entered By: Garen Grams LPN (August 07, 2009 3:12 PM)  Serial Vital Signs/Assessments:  Time      Position  BP       Pulse  Resp  Temp     By                     158/70                         Milinda Antis MD  CC: f/u dopplers/tingling in hands/htn Is Patient Diabetic? Yes Did you bring your meter with you today? No Pain Assessment Patient in pain? no        Primary Care Provider:  Milinda Antis MD  CC:  f/u dopplers/tingling in hands/htn.  History of Present Illness:   1. Carotid dopplers- noted bilateral mod blockage in dopplers, pt wanted to discuss consultation with vascular surgeon. Taking ASA and statin   2. Tingling in hands and feet- for past week has had tingling in fingertips on and off and bilat sides of great toes. Denies any injury. Denies complete numbness or weakness, denies dropping objects. No specific actviity cause symptoms  3. HTN- elevated BP today, feels stressed because of dopplers and tingling in hands and feet  Taking all meds as prescribed. Noted had 2 weeks of right ankle swelling now resolved  4. Sleep- past week having trouble with sleep, able to fall alseep but awake an hour or two later with sweats sometimes angina. Denies fever, cough, SOB. No recent change in meds , seen by cardiolgist pacer checked.  Pt had 1 episode of blood in stool, has paperwork at home for repeat Colonscopy- I advised her to do so with history of polyps  Habits & Providers  Alcohol-Tobacco-Diet     Tobacco Status: never  Current Medications (verified): 1)  Amlodipine Besylate 10 Mg Tabs (Amlodipine Besylate) .... One By Mouth Daily 2)  Aspir-81 81 Mg Tbec (Aspirin) .... Take 1 Tablet By Mouth Every  Morning 3)  Metformin Hcl 500 Mg Tabs (Metformin Hcl) .... Take 1/2  Tablet By Mouth Once A Day 4)  Potassium Chloride Cr 10 Meq Tbcr (Potassium Chloride) .... Take 1 Tablet By Mouth Every Morning 5)  Synthroid 50 Mcg Tabs (Levothyroxine Sodium) .... Take 1 Tablet By Mouth Once A Day 6)  Zocor 40 Mg Tabs (Simvastatin) .... Take 1 Tablet By Mouth At Bedtime 7)  Plavix 75 Mg  Tabs (Clopidogrel Bisulfate) .Marland Kitchen.. 1 By Mouth Daily (Nasher Prescribes) 8)  Ranexa 500 Mg  Tb12 (Ranolazine) .Marland Kitchen.. 1 By Mouth Two Times A Day (Nasher Prescribes) 9)  Imdur 60 Mg Xr24h-Tab (Isosorbide Mononitrate) .... 1/2 Tablet By Mouth Daily 10)  Furosemide 40 Mg Tabs (Furosemide) .Marland Kitchen.. 1 By Mouth Daily As Needed Swelling 11)  Bystolic 10 Mg  Tabs (Nebivolol Hcl) .... One Tablet Daily 12)  Trazodone Hcl 50 Mg Tabs (Trazodone Hcl) .... Take 1 Tablet At Bedtime As Needed For Sleep  Allergies (verified): No Known Drug Allergies  Past History:  Past Medical History: Triple Bypass 1989  Pacemaker 2004 Outpatient cardiolyte 03/2006 normal Sees Dr. Melburn Popper for cardiology. HTN Myocardial Infarction Chronic cough..........................................................................Marland KitchenWert     - onset around 2005 Carotid artery disease Diabetes Mellitus  Social History: Smoking Status:  never  Physical Exam  General:  alert and well-developed.  Looks younger than stated age. Vital signs noted  Lungs:  Normal respiratory effort, chest expands symmetrically. Lungs are clear to auscultation, no crackles or wheezes. Heart:  Normal rate and regular rhythm. 3/6 sytolic murmur-  Extremities:  no edema Neurologic:  Neg prayer sign, neg phallings test Neg spurlings test Strength equal bilat upper and lower ext  Diabetes Management Exam:    Foot Exam (with socks and/or shoes not present):       Sensory-Pinprick/Light touch:          Left medial foot (L-4): normal          Left dorsal foot (L-5): normal          Left  lateral foot (S-1): normal          Right medial foot (L-4): normal          Right dorsal foot (L-5): normal          Right lateral foot (S-1): normal       Sensory-Monofilament:          Left foot: diminished          Right foot: diminished       Sensory-other: mildly decreased monofilament on bilat great toes       Inspection:          Left foot: normal          Right foot: normal       Nails:          Left foot: normal          Right foot: normal   Impression & Recommendations:  Problem # 1:  DISTURBANCE OF SKIN SENSATION (ICD-782.0) Assessment New  Sensation changes not in specific glove/stocking pattern, and symptoms only persistant for short term. Exam not concerning for carpal tunnel either, will monitor hold on neurontin unless symptoms worsen Orders: [facility]- Est  Level 4 (91478)  Problem # 2:  CAROTID ARTERY DISEASE (ICD-433.10) Assessment: New Will send for consultation for vascular surgeon Her updated medication list for this problem includes:    Aspir-81 81 Mg Tbec (Aspirin) .Marland Kitchen... Take 1 tablet by mouth every morning    Plavix 75 Mg Tabs (Clopidogrel bisulfate) .Marland Kitchen... 1 by mouth daily (nasher prescribes)  Orders: [facility]- Est  Level 4 (29562)  Problem # 3:  HYPERTENSION, BENIGN ESSENTIAL (ICD-401.1) Assessment: Deteriorated Unsure of elevated BP today, maybe secondary to anxiety about her new diagnosis of carotid disease. No change to meds Her updated medication list for this problem includes:    Amlodipine Besylate 10 Mg Tabs (Amlodipine besylate) ..... One by mouth daily    Furosemide 40 Mg Tabs (Furosemide) .Marland Kitchen... 1 by mouth daily as needed swelling    Bystolic 10 Mg Tabs (Nebivolol hcl) ..... One tablet daily  Orders: [facility]- Est  Level 4 (13086)  Problem # 4:  INSOMNIA (ICD-780.52) Assessment: New Pt wishes to follow sleep pattern, no meds   Complete Medication List: 1)  Amlodipine Besylate 10 Mg Tabs (Amlodipine besylate) .... One by mouth daily 2)  Aspir-81  81 Mg Tbec (Aspirin) .... Take 1 tablet by mouth every morning 3)  Metformin Hcl 500 Mg Tabs (Metformin hcl) .... Take 1/2  tablet by mouth once  a day 4)  Potassium Chloride Cr 10 Meq Tbcr (Potassium chloride) .... Take 1 tablet by mouth every morning 5)  Synthroid 50 Mcg Tabs (Levothyroxine sodium) .... Take 1 tablet by mouth once a day 6)  Zocor 40 Mg Tabs (Simvastatin) .... Take 1 tablet by mouth at bedtime 7)  Plavix 75 Mg Tabs (Clopidogrel bisulfate) .Marland Kitchen.. 1 by mouth daily (nasher prescribes) 8)  Ranexa 500 Mg Tb12 (Ranolazine) .Marland Kitchen.. 1 by mouth two times a day (nasher prescribes) 9)  Imdur 60 Mg Xr24h-tab (Isosorbide mononitrate) .... 1/2 tablet by mouth daily 10)  Furosemide 40 Mg Tabs (Furosemide) .Marland Kitchen.. 1 by mouth daily as needed swelling 11)  Bystolic 10 Mg Tabs (Nebivolol hcl) .... One tablet daily 12)  Trazodone Hcl 50 Mg Tabs (Trazodone hcl) .... Take 1 tablet at bedtime as needed for sleep  Patient Instructions: 1)  I will send a referral for the Vascular Surgeon to discuss your cartoid disease 2)  Start the trazodone as needed for sleep 3)  If the tingling gets worse in your hands or feet please let me know 4)  Next visit in 6 weeks  Prescriptions: TRAZODONE HCL 50 MG TABS (TRAZODONE HCL) Take 1 tablet at bedtime as needed for sleep  #30 x 1   Entered and Authorized by:   Milinda Antis MD   Signed by:   Milinda Antis MD on 08/07/2009   Method used:   Electronically to        [facility] (retail)       [address]       [place], [place]  [id number]       Ph: [phone]       Fax: [phone]   RxID:   [id number]    Prevention & Chronic Care Immunizations   Influenza vaccine: Fluvax MCR  (11/14/2008)   Influenza vaccine due: 11/20/2007    Tetanus booster: 11/25/2006: Tdap   Tetanus booster due: 11/24/2016    Pneumococcal vaccine: Not documented    H. zoster vaccine: Not documented  Colorectal Screening   Hemoccult: Not  documented    Colonoscopy: Adenomatous Polyp  (04/20/2003)   Colonoscopy due: 04/19/2013  Other Screening   Pap smear: Not documented    Mammogram: ASSESSMENT: Negative - BI-RADS 1^MM DIGITAL SCREENING  (05/28/2009)   Mammogram due: 05/25/2008    DXA bone density scan: Not documented   Smoking status: never  (08/07/2009)  Diabetes Mellitus   HgbA1C: 5.8  (12/18/2008)   Hemoglobin A1C due: 07/16/2006    Eye exam: normal  (07/27/2008)   Eye exam due: 08/2009    Foot exam: yes  (08/07/2009)   High risk foot: Not documented   Foot care education: Not documented   Foot exam due: 04/15/2007    Urine microalbumin/creatinine ratio: Not documented  Lipids   Total Cholesterol: 123  (06/09/2006)   LDL: 63  (06/09/2006)   LDL Direct: 58  (12/18/2008)   HDL: 39  (06/09/2006)   Triglycerides: 107  (06/09/2006)    SGOT (AST): 21  (12/18/2008)   SGPT (ALT): 10  (12/18/2008)   Alkaline phosphatase: 98  (12/18/2008)   Total bilirubin: 0.5  (12/18/2008)  Hypertension   Last Blood Pressure: 162 / 70  (08/07/2009)   Serum creatinine: 0.85  (12/18/2008)   Serum potassium 3.5  (12/18/2008)  Self-Management Support :   Personal Goals (by the next clinic visit) :     Personal A1C goal: 7  (12/18/2008)     Personal  blood pressure goal: 130/80  (12/18/2008)     Personal LDL goal: 70  (12/18/2008)    Diabetes self-management support: Not documented    Hypertension self-management support: Not documented    Lipid self-management support: Not documented

## 2010-03-14 NOTE — Assessment & Plan Note (Signed)
Summary: pain going down back/eo   Vital Signs:  Patient profile:   77 year old female Height:      62 inches Weight:      135 pounds Temp:     97.9 degrees F oral Pulse rate:   81 / minute Pulse rhythm:   regular BP sitting:   149 / 72  (left arm) Cuff size:   regular  Vitals Entered By: Loralee Pacas CMA (February 21, 2010 2:38 PM) CC: pain going down back Is Patient Diabetic? Yes Did you bring your meter with you today? No Comments pt thinks that she has gas thats causing her back pain x 6 days   Primary Care Provider:  Milinda Antis MD  CC:  pain going down back.  History of Present Illness: 77 yo female with CAD, s/p CABG.  L lower lateral rib pain:  Pain present for the past 5-6 days, described as feeling like she needs to belch, her symptoms do improve slightly when she does belch.  Her symptoms last just a few mins and then resolve.  She has never taken NTG for this issue. Symptoms are not exertional, the are not relieved by rest however she does get  some pain in the L wrist when she gets the rib pain.  She had a catheterization in 2009 by Dr. Elease Hashimoto and all clinically significant vessels were clean, PTCA of a smaller non-significant vessel was attempted but not possible at that time.   No cough, no presyncope, no nausea or diaphoresis when she gets the episodes.  The pain is not reproducible.    Habits & Providers  Alcohol-Tobacco-Diet     Alcohol drinks/day: 0     Alcohol type: wine     Tobacco Status: never     Tobacco Counseling: to remain off tobacco products     Year Quit: 1975     Passive Smoke Exposure: no  Exercise-Depression-Behavior     Have you felt down or hopeless? yes     Have you felt little pleasure in things? no     Depression Counseling: not indicated; screening negative for depression     Seat Belt Use: always  Current Medications (verified): 1)  Amlodipine Besylate 10 Mg Tabs (Amlodipine Besylate) .... One By Mouth Daily 2)  Aspir-81 81 Mg  Tbec (Aspirin) .... Take 1 Tablet By Mouth Every Morning 3)  Metformin Hcl 500 Mg Tabs (Metformin Hcl) .... Take 1/2  Tablet By Mouth Once A Day 4)  Potassium Chloride Cr 20 Meq Tbcr (Potassium Chloride) .... Take 1 Tablet By Mouth Every Morning 5)  Synthroid 10 Mcg Tabs (Levothyroxine Sodium) .... Take 1 Tablet By Mouth Once A Day 6)  Zocor 40 Mg Tabs (Simvastatin) .... Take 1 Tablet By Mouth At Bedtime 7)  Plavix 75 Mg  Tabs (Clopidogrel Bisulfate) .Marland Kitchen.. 1 By Mouth Daily (Nasher Prescribes) 8)  Ranexa 500 Mg  Tb12 (Ranolazine) .Marland Kitchen.. 1 By Mouth Two Times A Day (Nasher Prescribes) 9)  Imdur 60 Mg Xr24h-Tab (Isosorbide Mononitrate) .... 1/2 Tablet By Mouth Daily 10)  Furosemide 40 Mg Tabs (Furosemide) .Marland Kitchen.. 1 By Mouth Daily As Needed Swelling 11)  Bystolic 10 Mg  Tabs (Nebivolol Hcl) .... One Tablet Daily 12)  Trazodone Hcl 50 Mg Tabs (Trazodone Hcl) .... Take 1 Tablet At Bedtime As Needed For Sleep 13)  Simethicone 125 Mg Chew (Simethicone) .... One Tab By Mouth Three Times A Day  Allergies (verified): No Known Drug Allergies  Social History: Risk analyst Use:  always  Review of Systems       See HPI  Physical Exam  General:  Well-developed,well-nourished,in no acute distress; alert,appropriate and cooperative throughout examination Chest Wall:  Pain is not reproducible with palpation on chest wall. Lungs:  Normal respiratory effort, chest expands symmetrically. Lungs are clear to auscultation, no crackles or wheezes. Heart:  Normal rate and regular rhythm. S1 and S2 normal without gallop, murmur, click, rub or other extra sounds. Extremities:  No swelling or edema, warm, well perfused. Additional Exam:  12 lead ECG with V-paced rhythm, noted DDDr pacer in place, last check with >99% V-paced.  No overt S-T changes, rate normal.   Impression & Recommendations:  Problem # 1:  RIB PAIN, LEFT SIDED (ICD-786.50) Assessment New This pain is non-cardiac in nature (not substernal, nor worse  with exertion, better with rest, or better with NTG) I discussed this with Dr. Elease Hashimoto on the phone, he remembers her well and remembers that all clinically significant vessels were patent on the cath in 2009. No further intervention needs to be done. He can see her if needed. Will have Rashauna call his office today for an appt either tomorrow or Monday. Will also provide high-dose simethicone for her sensation of excessive eructations. She will take NTG the next time she has these symptoms to see if it helps. RTC if no better in a couple of days.  Orders: [facility]- Est  Level 4 ([address])  Complete Medication List: 1)  Amlodipine Besylate 10 Mg Tabs (Amlodipine besylate) .... One by mouth daily 2)  Aspir-81 81 Mg Tbec (Aspirin) .... Take 1 tablet by mouth every morning 3)  Metformin Hcl 500 Mg Tabs (Metformin hcl) .... Take 1/2  tablet by mouth once a day 4)  Potassium Chloride Cr 20 Meq Tbcr (potassium Chloride)  .... Take 1 tablet by mouth every morning 5)  Synthroid 10 Mcg Tabs (levothyroxine Sodium)  .... Take 1 tablet by mouth once a day 6)  Zocor 40 Mg Tabs (Simvastatin) .... Take 1 tablet by mouth at bedtime 7)  Plavix 75 Mg Tabs (Clopidogrel bisulfate) .Marland Kitchen.. 1 by mouth daily (nasher prescribes) 8)  Ranexa 500 Mg Tb12 (Ranolazine) .Marland Kitchen.. 1 by mouth two times a day (nasher prescribes) 9)  Imdur 60 Mg Xr24h-tab (Isosorbide mononitrate) .... 1/2 tablet by mouth daily 10)  Furosemide 40 Mg Tabs (Furosemide) .Marland Kitchen.. 1 by mouth daily as needed swelling 11)  Bystolic 10 Mg Tabs (Nebivolol hcl) .... One tablet daily 12)  Trazodone Hcl 50 Mg Tabs (Trazodone hcl) .... Take 1 tablet at bedtime as needed for sleep 13)  Simethicone 125 Mg Chew (Simethicone) .... One tab by mouth three times a day  Patient Instructions: 1)  Great to meet you, 2)  Sounds like you may be having some gas.  Will give you a medication to treat that. 3)  Would like you to see Dr. Elease Hashimoto at [facility] soon, please call  for an appt early next week.  Use the nitroglycerin next time you get the pain in your rib and let us know if that makes it go away. 4)  Come back to see [place] if no better with the gas medication in a few days. 5)  -Dr. Karie Schwalbe. Prescriptions: SIMETHICONE 125 MG CHEW (SIMETHICONE) One tab by mouth three times a day  #90 x 0   Entered and Authorized by:   Rodney Langton MD   Signed by:   Rodney Langton MD on 02/21/2010   Method used:  Electronically to        [facility] (retail)       [address]       [place], [place]  [id number]       Ph: [phone]       Fax: [phone]   RxID:   [id number]    Orders Added: 1)  [facility]- Est  Level 4 [24401]

## 2010-03-14 NOTE — Miscellaneous (Signed)
Summary: lABS  Clinical Lists Changes  Observations: Added new observation of PLATELETK/UL: 281 K/uL (09/25/2009 18:35) Added new observation of MCHC RBC: 33.5 g/dL (16/11/9602 54:09) Added new observation of MCV: 90.5 fL (09/25/2009 18:35) Added new observation of HCT: 37.9 % (09/25/2009 18:35) Added new observation of HGB: 12.7 g/dL (81/19/1478 29:56) Added new observation of RBC M/UL: 4.19 M/uL (09/25/2009 18:35) Added new observation of WBC COUNT: 5.4 10*3/microliter (09/25/2009 18:35) Added new observation of TSH: 3.041 microintl units/mL (09/25/2009 18:35) Added new observation of CALCIUM: 9.3 mg/dL (21/30/8657 84:69) Added new observation of ALBUMIN: 4.3 g/dL (62/95/2841 32:44) Added new observation of PROTEIN, TOT: 7.5 g/dL (02/12/7251 66:44) Added new observation of SGPT (ALT): 10 units/L (09/25/2009 18:35) Added new observation of SGOT (AST): 23 units/L (09/25/2009 18:35) Added new observation of ALK PHOS: 89 units/L (09/25/2009 18:35) Added new observation of BILI INDIREC: 0.4 mg/dL (03/47/4259 56:38) Added new observation of BILI DIRECT: 0.1 mg/dL (75/64/3329 51:88) Added new observation of BILI TOTAL: 0.5 mg/dL (41/66/0630 16:01) Added new observation of CREATININE: 0.94 mg/dL (09/32/3557 32:20) Added new observation of BUN: 10 mg/dL (25/42/7062 37:62) Added new observation of BG RANDOM: 85 mg/dL (83/15/1761 60:73) Added new observation of CO2 PLSM/SER: 29 meq/L (09/25/2009 18:35) Added new observation of CL SERUM: 99 meq/L (09/25/2009 18:35) Added new observation of K SERUM: 4.1 meq/L (09/25/2009 18:35) Added new observation of NA: 138 meq/L (09/25/2009 18:35)

## 2010-03-18 ENCOUNTER — Other Ambulatory Visit: Payer: Self-pay

## 2010-03-20 NOTE — Progress Notes (Signed)
  Phone Note Outgoing Call   Call placed by: Milinda Antis MD,  March 14, 2010 3:08 PM Details for Reason: labs Summary of Call: Pt given lab results by nurse as well Neg CXR , and labs besides vit D She did buy OTC VitD, I told her to take the 50,000IU q weekly first then start the maintanance She voiced understanding

## 2010-03-20 NOTE — Progress Notes (Signed)
Summary: LVM- nurse can give results  Phone Note Outgoing Call   Call placed by: Milinda Antis MD,  March 11, 2010 11:24 AM Details for Reason: LVM Summary of Call: Lab results- normal with exception of Vit D level, which was low, I have a sent a prescription for this The CXR was normal. These labs did not show any reason for her hot flashes

## 2010-04-02 ENCOUNTER — Ambulatory Visit (INDEPENDENT_AMBULATORY_CARE_PROVIDER_SITE_OTHER): Payer: Medicare Other | Admitting: Family Medicine

## 2010-04-02 ENCOUNTER — Encounter: Payer: Self-pay | Admitting: Family Medicine

## 2010-04-02 VITALS — BP 127/65 | HR 79 | Temp 97.7°F | Ht 63.0 in | Wt 137.0 lb

## 2010-04-02 DIAGNOSIS — I10 Essential (primary) hypertension: Secondary | ICD-10-CM

## 2010-04-02 DIAGNOSIS — R61 Generalized hyperhidrosis: Secondary | ICD-10-CM

## 2010-04-02 NOTE — Assessment & Plan Note (Signed)
Improved, without intervention, work-up neg, CXR neg, no further work-up at this time

## 2010-04-02 NOTE — Patient Instructions (Signed)
Next visit in June  Call if you have any concerns  Blood pressure looks great today!

## 2010-04-02 NOTE — Assessment & Plan Note (Signed)
At goal, continue current meds

## 2010-04-02 NOTE — Progress Notes (Signed)
  Subjective:    Patient ID: Sydney Moore, female    DOB: Oct 25, 1923, 77 y.o.   MRN: [id number]  HPI Night sweats- previous visit had night sweats, CXR and labs all within normal limits, states this has improved, she is wearing less clothing at night, but not sure if this really made a big difference        HTN- no chest pain, no SOB, no recent illness, feeling well overall    Review of Systems Per above     Objective:   Physical Exam GEN- NAD, alert and oriented, pleasant CVS- RRR, 3/6 SEM RESP-CTAB       Assessment & Plan:

## 2010-05-08 ENCOUNTER — Encounter: Payer: Self-pay | Admitting: Internal Medicine

## 2010-05-09 ENCOUNTER — Encounter: Payer: Self-pay | Admitting: Internal Medicine

## 2010-05-09 ENCOUNTER — Ambulatory Visit (INDEPENDENT_AMBULATORY_CARE_PROVIDER_SITE_OTHER): Payer: Medicare Other | Admitting: Internal Medicine

## 2010-05-09 DIAGNOSIS — I442 Atrioventricular block, complete: Secondary | ICD-10-CM | POA: Insufficient documentation

## 2010-05-09 DIAGNOSIS — I2510 Atherosclerotic heart disease of native coronary artery without angina pectoris: Secondary | ICD-10-CM

## 2010-05-09 DIAGNOSIS — I10 Essential (primary) hypertension: Secondary | ICD-10-CM

## 2010-05-09 DIAGNOSIS — I25810 Atherosclerosis of coronary artery bypass graft(s) without angina pectoris: Secondary | ICD-10-CM

## 2010-05-09 NOTE — Patient Instructions (Signed)
Your physician recommends that you schedule a follow-up appointment in: device clinic in 3 months   Your physician recommends that you continue on your current medications as directed. Please refer to the Current Medication list given to you today.

## 2010-05-09 NOTE — Assessment & Plan Note (Signed)
Mildly elevated Salt restriction advised No changes today

## 2010-05-09 NOTE — Assessment & Plan Note (Signed)
Normal pacemaker function  (see paceart) No changes today 2-79months (estimated 1 year) remains on battery.  As she is dependant, we will see her again in 3 months

## 2010-05-09 NOTE — Progress Notes (Signed)
The patient presents today to establish care in the EP clinic.  She presented 2004 with complete heart block and had a pacemaker implanted by Dr Deborah Chalk at that time.  She reports doing well since that time.   She remains quite active despite her age.  She has significant CAD but denies exertional symptoms. Today, she denies symptoms of palpitations, chest pain, shortness of breath, orthopnea, PND, lower extremity edema, dizziness, presyncope, syncope, or neurologic sequela.  The patient feels that she is tolerating medications without difficulties and is otherwise without complaint today.   Past Medical History  Diagnosis Date  . Hypertension   . Hyperlipidemia   . MI (myocardial infarction)     Triple Bypass 1989  . Diabetes mellitus   . Chronic cough     Seen by Dr. Sherene Sires  . Carotid bruit     Left, Monitor seen by [facility]  . Thyroid disease     Hypothyroidism  . CAD (coronary artery disease)   . Complete heart block     s/p PPM (MDT) by Dr Deborah Chalk 2004   Past Surgical History  Procedure Date  . Coronary artery bypass graft 1989  . Pacemaker placement 2004    MDT by Dr Deborah Chalk    Current outpatient prescriptions:amLODipine (NORVASC) 10 MG tablet, Take 10 mg by mouth daily.  , Disp: , Rfl: ;  aspirin 81 MG tablet, Take 81 mg by mouth every morning.  , Disp: , Rfl: ;  clopidogrel (PLAVIX) 75 MG tablet, Take 75 mg by mouth daily. Dr. Melburn Popper prescribes , Disp: , Rfl: ;  furosemide (LASIX) 40 MG tablet, Take 40 mg by mouth daily. for swelling , Disp: , Rfl:  isosorbide mononitrate (IMDUR) 60 MG 24 hr tablet, 1/2 tablet by mouth daily , Disp: , Rfl: ;  levothyroxine (SYNTHROID, LEVOTHROID) 50 MCG tablet, Take 50 mcg by mouth daily.  , Disp: , Rfl: ;  metFORMIN (GLUCOPHAGE) 500 MG tablet, Take 1/2  tablet by mouth once a day. , Disp: , Rfl: ;  metoprolol succinate (TOPROL-XL) 25 MG 24 hr tablet, Take 25 mg by mouth daily.  , Disp: , Rfl:  potassium chloride SA (K-DUR,KLOR-CON) 20 MEQ tablet, Take  20 mEq by mouth every morning.  , Disp: , Rfl: ;  ranolazine (RANEXA) 500 MG 12 hr tablet, Take 500 mg by mouth 2 (two) times daily. Dr. Melburn Popper prescribes , Disp: , Rfl: ;  simvastatin (ZOCOR) 40 MG tablet, Take 40 mg by mouth at bedtime.  , Disp: , Rfl: ;  DISCONTD: nebivolol (BYSTOLIC) 10 MG tablet, Take 10 mg by mouth daily.  , Disp: , Rfl:   No Known Allergies  History   Social History  . Marital Status: Widowed    Spouse Name: N/A    Number of Children: N/A  . Years of Education: N/A   Occupational History  . Not on file.   Social History Main Topics  . Smoking status: Former Games developer  . Smokeless tobacco: Never Used  . Alcohol Use: Yes  . Drug Use: No  . Sexually Active: No   Other Topics Concern  . Not on file   Social History Narrative  . No narrative on file    Family History  Problem Relation Age of Onset  . Hypertension Mother   . Liver cancer Brother   . Breast cancer Daughter   . Colon cancer Brother     ROS-  All systems are reviewed and are negative except as outlined in  the HPI above   Physical Exam: Filed Vitals:   05/09/10 1006  BP: 146/68  Pulse: 72  Height: 5\' 3"  (1.6 m)  Weight: 140 lb 6.4 oz (63.685 kg)    GEN- The patient is well appearing, alert and oriented x 3 today.   Head- normocephalic, atraumatic Eyes-  Sclera clear, conjunctiva pink Ears- hearing intact Oropharynx- clear Neck- supple, no JVP Lymph- no cervical lymphadenopathy Lungs- Clear to ausculation bilaterally, normal work of breathing Chest- R sided pacemaker pocket is well healed Heart- Regular rate and rhythm,  2/6 SEM LSB Extremities- no clubbing, cyanosis, or edema MS- no significant deformity or atrophy Skin- no rash or lesion Psych- euthymic mood, full affect Neuro- strength and sensation are intact

## 2010-05-09 NOTE — Assessment & Plan Note (Signed)
Stable without ischemic symptoms No changes today

## 2010-05-14 ENCOUNTER — Encounter: Payer: Self-pay | Admitting: Home Health Services

## 2010-06-10 ENCOUNTER — Other Ambulatory Visit: Payer: Self-pay | Admitting: Family Medicine

## 2010-06-10 DIAGNOSIS — Z1231 Encounter for screening mammogram for malignant neoplasm of breast: Secondary | ICD-10-CM

## 2010-06-17 ENCOUNTER — Ambulatory Visit
Admission: RE | Admit: 2010-06-17 | Discharge: 2010-06-17 | Disposition: A | Discharge status: Home / Self Care | Payer: Medicare Other | Source: Ambulatory Visit | Attending: *Deleted | Admitting: *Deleted

## 2010-06-17 DIAGNOSIS — Z1231 Encounter for screening mammogram for malignant neoplasm of breast: Secondary | ICD-10-CM

## 2010-06-18 ENCOUNTER — Ambulatory Visit: Payer: Medicare Other | Admitting: Family Medicine

## 2010-06-18 ENCOUNTER — Ambulatory Visit: Payer: Medicare Other | Admitting: Cardiovascular Disease

## 2010-06-20 ENCOUNTER — Ambulatory Visit (INDEPENDENT_AMBULATORY_CARE_PROVIDER_SITE_OTHER): Payer: Medicare Other | Admitting: Family Medicine

## 2010-06-20 ENCOUNTER — Encounter: Payer: Self-pay | Admitting: Family Medicine

## 2010-06-20 ENCOUNTER — Ambulatory Visit: Payer: Medicare Other | Admitting: Cardiovascular Disease

## 2010-06-20 VITALS — BP 147/73 | HR 81 | Temp 97.8°F | Ht 63.0 in | Wt 139.0 lb

## 2010-06-20 DIAGNOSIS — R21 Rash and other nonspecific skin eruption: Secondary | ICD-10-CM

## 2010-06-20 DIAGNOSIS — M79643 Pain in unspecified hand: Secondary | ICD-10-CM | POA: Insufficient documentation

## 2010-06-20 DIAGNOSIS — I10 Essential (primary) hypertension: Secondary | ICD-10-CM

## 2010-06-20 DIAGNOSIS — M79609 Pain in unspecified limb: Secondary | ICD-10-CM

## 2010-06-20 DIAGNOSIS — E119 Type 2 diabetes mellitus without complications: Secondary | ICD-10-CM

## 2010-06-20 MED ORDER — CLOBETASOL PROPIONATE 0.05 % EX CREA: TOPICAL_CREAM | CUTANEOUS | Status: DC

## 2010-06-20 NOTE — Progress Notes (Signed)
  Subjective:    Patient ID: Sydney Moore, female    DOB: 1924/01/24, 77 y.o.   MRN: [id number]  HPI  Hand pain- pain in bilat thumbs, after fall to hands, she stumbled carrying a flower pot. Has not taken any extra meds for pain , +swelling in hands   Pacemaker- has seen cardiology, will be seen every 3 months, will need a battery replaced at some point   HTN- discontinued bystolic, now on Toprol Getting medications from [facility]   Chest pain- occurs every few weeks, uses Nitroglycerin as needed, last a few weeks  Able to walk approx 10 minutes before getting tired  Participates in exercise class at church    Rash on right forearm- for past few days, previously had a rash on left forearm 1 month ago, cleared up with Temovate cream, +pruritis, has been pulling some weeds       Review of Systems     Objective:   Physical Exam   GEN- NAD, alert and oriented   CVS- RRR, 3/6 SEM   RESP- CTAB   Hands- normal grasp, normal ROM at wrist bilat, deformities at DIP on bilateral 2-3rd digits, Left thumb- nodular appearance at DIP, mild TTP at distal phalanx, Right thumb, mild swelling over thumb down to base, TTP over area of swelling-mild, no bruising, no erythema, normal ROM at all fingers SKIN- scattered erythematous maculopapular lesions on right forearm, +excoriations, no vesicles, some in linear pattern       Assessment & Plan:

## 2010-06-20 NOTE — Assessment & Plan Note (Signed)
Pt with bruising likley, no gross deformities on bilat thumbs bilat, will monitor, if she continues to have pain or has increased swelling obtain x-rays She did not need any pain meds

## 2010-06-20 NOTE — Assessment & Plan Note (Signed)
Contact dermatitis of some sort, will give topical steroid

## 2010-06-20 NOTE — Patient Instructions (Signed)
Take the copy of labs to Dr. Elease Hashimoto Call me if you still have pain in your hands Use the steroid cream for your rash, if this does not improve come back for a recheck Next visit in 6 months with your new doctor- you can call in June to see who it is.

## 2010-06-20 NOTE — Assessment & Plan Note (Signed)
Mild deterioration into systolic 140's, no change to meds, as to not cause hypotension

## 2010-06-21 ENCOUNTER — Other Ambulatory Visit: Payer: Self-pay | Admitting: *Deleted

## 2010-06-21 ENCOUNTER — Other Ambulatory Visit: Payer: Self-pay | Admitting: Family Medicine

## 2010-06-21 DIAGNOSIS — Z1231 Encounter for screening mammogram for malignant neoplasm of breast: Secondary | ICD-10-CM

## 2010-06-25 NOTE — H&P (Signed)
NAME:  [name], [name] NO.:  [id number]   MEDICAL RECORD NO.:  [id number]           PATIENT TYPE:   LOCATION:                                 FACILITY:   PHYSICIAN:  Vesta Mixer, M.D.      DATE OF BIRTH:   DATE OF ADMISSION:  DATE OF DISCHARGE:                              HISTORY & PHYSICAL   Sydney Moore is an elderly female with a history of coronary artery  disease.  She is status post coronary bypass grafting 20 years ago.  She  also has a history bradycardia and is status post pacemaker  implantation.  She is now admitted for heart catheterization after  presenting with some episodes of chest pain.   Sydney Moore has a long history of coronary artery disease.  She is status  post CABG in [place] approximately 20 years ago.  She had a heart  catheterization back in 2007, which revealed an occlusion of the  saphenous vein graft to the obtuse marginal artery.  She had a patent  saphenous vein graft to the right coronary artery and a patent left  internal mammary artery to the LAD.  She has done fairly well.  She was  scheduled to have a colonoscopy and she has been given some mag citrate.  She began drinking that yesterday and she started having episodes of  chest pain.  She has recurrent chest pain today and took a nitroglycerin  with prompt resolution of the pains.  She now presents for further  evaluation.   The patient also has a history of congestive heart failure.  Her  ejection fraction is 35% by nuclear stress test 2 years ago.   CURRENT MEDICATIONS:  1. Synthroid 50 mcg a day.  2. Aspirin 81 mg a day.  3. Potassium chloride 10 mEq a day.  4. Metformin 250 mg a day.  5. Furosemide 40 mg a day.  6. Lotrel 10 mg/20 mg once a day.  7. Imdur 60 mg a day.  8. Carvedilol 12.5 mg p.o. b.i.d.   ALLERGIES:  She has no known drug allergies.   PAST MEDICAL HISTORY:  1. Coronary artery disease - status post CABG at [facility]      by Dr.  Myrtis Ser.  2. Congestive heart failure.  Her ejection fraction is around 35%.  3. Hypercholesterolemia.  4. Hypertension.  5. Left ventricular hypertrophy.   SOCIAL HISTORY:  The patient used to smoke, but quit in 1975.  She does  not drink alcohol.   FAMILY HISTORY:  Reviewed and is essentially negative.   PHYSICAL EXAMINATION:  GENERAL:  She is an elderly female who appears to  be quite younger than her stated age.  VITAL SIGNS:  Weight is 134, blood pressure is 140/70 with heart rate of  72.  HEENT:  Reveals 2+ carotids.  NECK:  She has no bruits, no JVD, no thyromegaly.  LUNGS: Clear to auscultation.  HEART:  Regular rate, S1 and S2.  ABDOMEN:  Reveals good bowel sounds and is nontender.  EXTREMITIES:  She has no clubbing, cyanosis,  or edema.  NEURO:  Nonfocal.   Her EKG reveals ventricular pacing.   Sydney Moore presents with some episodes of chest discomfort.  She had very  old saphenous vein grafts.  I would be inclined to admit her for an  outpatient heart catheterization.  We have discussed the risks,  benefits, and options of heart catheterization.  She understands and  agrees to proceed.           ______________________________  Vesta Mixer, M.D.     PJN/MEDQ  D:  06/28/2007  T:  06/29/2007  Job:  [id number]   cc:   Rolm Gala, M.D.

## 2010-06-25 NOTE — Cardiovascular Report (Signed)
NAME:  NICEY, KRAH NO.:  [id number]   MEDICAL RECORD NO.:  [id number]          PATIENT TYPE:  OIB   LOCATION:  NA                           FACILITY:  [facility]   PHYSICIAN:  Vesta Mixer, M.D. DATE OF BIRTH:  05/14/1923   DATE OF PROCEDURE:  07/02/2007  DATE OF DISCHARGE:                            CARDIAC CATHETERIZATION   Sydney Moore is an 77 year old female with a history of coronary artery  disease.  She is status post coronary artery bypass grafting  approximately 22 years ago.  She is now referred for heart  catheterization after having some episodes of chest pain.   The right femoral artery was easily cannulated using modified Seldinger  technique.   HEMODYNAMICS:  LV pressure is 120/10 with an aortic pressure of 118/54.   Angiography of left main, the left main has moderate coronary artery  irregularities.   The left anterior descending artery has a 90% proximal stenosis and is  then occluded after the first diagonal branch.  The first diagonal  artery has a 90% stenosis and is a relatively small vessel.  It is  fairly normal throughout after that.   There is a complex ramus intermediate vessel.  There appears to be a 90%  stenosis at the origin of this vessel.  It appears that there is also a  branch coming off the ramus intermediate vessel that has some degree of  stenosis.   Left circumflex artery has severe proximal disease.  It is occluded.    Dictation ended at this point.           ______________________________  Vesta Mixer, M.D.     PJN/MEDQ  D:  07/02/2007  T:  07/03/2007  Job:  [id number]   cc:   Rolm Gala, M.D.

## 2010-06-25 NOTE — Assessment & Plan Note (Signed)
OFFICE VISIT   RAFEEF, Sydney Moore  DOB:  [birth year]                                       09/17/2009  EAVWU#:[id number]   REFERRING PHYSICIAN:  Milinda Antis, M.D.   REASON FOR VISIT:  Carotid stenosis.   HISTORY:  This is an 77 year old female seen at the request of Dr.  Jeanice Lim for carotid stenosis.  The patient had an ultrasound several  months ago which showed 60% to 80% stenosis on the right and 40% to 60%  stenosis on the left.  The patient denies having any neurologic  symptoms.  She denies numbness or weakness in either extremity.  She  denies slurring of her speech.  She denies amaurosis fugax.   The patient has a significant cardiac history, having undergone  pacemaker in 2004 and CABG x3 in 1989.  She also suffers from diabetes,  hypertension, and hypercholesterolemia, all of which are medically  managed.  She is a former smoker, quit in 1975.   REVIEW OF SYSTEMS:  GENERAL:  Negative for fevers, chills, weight gain,  weight loss.  VASCULAR:  Negative for claudication.  CARDIAC:  Positive for chest pain and shortness of breath with exertion.  GI:  Positive for reflux.  NEURO:  Negative.  PULMONARY:  Negative.  HEME:  Negative.  GU:  Negative.  EENT:  Positive for recent change in eyesight.  MUSCULOSKELETAL:  Negative.  PSYCH:  Negative.  SKIN:  Negative.   PAST MEDICAL HISTORY:  Diabetes, hypertension, hypercholesterolemia,  congestive heart failure, hypothyroidism, gastroesophageal reflux  disease.   PAST SURGICAL HISTORY:  CABG x3 in 1989, pacemaker in 2004.   SOCIAL HISTORY:  She is widowed with 2 children.  She is retired.  Does  not smoke, quit in 1975.  Does not drink.   FAMILY HISTORY:  Negative for premature cardiovascular disease.   PHYSICAL EXAMINATION:  Heart rate is 70, blood pressure 145/63.  O2 sats  are 99%.  General:  She is well-appearing in no distress.  HEENT:  Within normal limits.  Lungs are clear bilaterally.   No wheezes or  rhonchi.  Cardiovascular:  Regular rate and rhythm.  No murmur.  Abdomen:  Soft, nontender.  No pulsatile mass.  Musculoskeletal:  Without major deformities.  Neuro:  No focal weaknesses or deficits.  Skin is without rash.   ASSESSMENT/PLAN:  Asymptomatic, moderate carotid stenosis.   PLAN:  I discussed the signs and symptoms of stroke/TIA with the patient  and told her to go to the emergency department should she develop any of  these symptoms.  At this point she is well controlled from a medical  standpoint for her atherosclerotic disease.  As long as she remains  asymptomatic, I would not recommend carotid intervention until she  progresses to greater than 80%.  We will continue to follow her on a  biannual basis.  We will see her back in 6 months with a repeat  ultrasound.     Jorge Ny, MD  Electronically Signed   VWB/MEDQ  D:  09/17/2009  T:  09/18/2009  Job:  [id number]   cc:   Milinda Antis, MD

## 2010-06-25 NOTE — Cardiovascular Report (Signed)
NAME:  [name], [name] NO.:  [id number]   MEDICAL RECORD NO.:  [id number]          PATIENT TYPE:  OIB   LOCATION:  6529                         FACILITY:  [facility]   PHYSICIAN:  Vesta Mixer, M.D. DATE OF BIRTH:  05-03-1923   DATE OF PROCEDURE:  07/19/2007  DATE OF DISCHARGE:                            CARDIAC CATHETERIZATION   Sydney Moore is an 77 year old female with a history of coronary artery  disease.  She had a diagnostic heart catheterization several weeks ago.  She is found to have several narrowings including the first diagonal  artery that has a tight blockage.  There is a 90% stenosis at the origin  of the left main and the first diagonal artery takes off fairly quickly  after that.  She is brought back for PCI attempt.   The procedure was PCI attempt.   The right femoral artery was easily cannulated using a modified  Seldinger technique.  At the end of case, an angiogram revealed that the  site was fairly close to bifurcation and so manual compression was used  for hemostasis and we did not use an AngioSeal.   The left main was best engaged using a Judkins 3.5 guide.  Angiomax was  given.  The ACT was 390.  Multiple wires were used to try to get out  into the diagonal artery.  An Curator, BMW Luge, and  an Asahi medium wire, all of these wires were unsuccessful because of  the extreme tortuosity of the origin of the left anterior descending  artery and the diagonal artery.  We aborted the procedure after multiple  wire attempts because of inability to wire the lesion.  We will treat  her medically.  We will continue the Angiomax until the bag is empty.  We will start her on Ranexa 500 mg twice a day.           ______________________________  Vesta Mixer, M.D.     PJN/MEDQ  D:  07/19/2007  T:  07/19/2007  Job:  [id number]   cc:   Rolm Gala, M.D.

## 2010-06-25 NOTE — Discharge Summary (Signed)
NAMEMarland Kitchen  [name], [name]                ACCOUNT NO.:  [id number]   MEDICAL RECORD NO.:  [id number]          PATIENT TYPE:  OIB   LOCATION:  6529                         FACILITY:  [facility]   PHYSICIAN:  Vesta Mixer, M.D. DATE OF BIRTH:  19-Jul-1923   DATE OF ADMISSION:  07/19/2007  DATE OF DISCHARGE:  07/20/2007                               DISCHARGE SUMMARY   DISCHARGE DIAGNOSES:  1. Coronary artery disease - status post failed attempt at angioplasty      of the proximal left anterior descending artery.  We were unable to      cross with a guidewire.  2. Hypothyroidism.  3. Congestive heart failure.  4. Sick sinus syndrome - status post pacemaker implantation.  5. Diabetes mellitus.   DISCHARGE MEDICATIONS:  1. Enteric-coated aspirin 81 mg a day.  2. Plavix 75 mg a day.  3. Ranexa 500 mg twice a day.  4. Synthroid 50 mcg once a day.  5. Potassium chloride 20 mEq a day.  6. Furosemide 40 mg a day.  7. Imdur 60 mg a day.  8. Carvedilol 12.5 mg twice a day.  9. Metformin 250 mg once a day or as otherwise directed by her general      medical doctor.   The patient will see Dr. Elease Moore in 1-2 weeks.  We will see her again in  a month for an EKG to check her QT interval.   HISTORY:  Sydney Moore is an 77 year old female with a history of coronary  artery bypass grafting approximately 20 years ago.  She recently had a  diagnostic heart catheterization which revealed a tight stenosis in her  proximal LAD.  The LAD itself was occluded, but this provided flow out  to a diagonal vessel.  She was scheduled for PCI of this vessel.   The patient was admitted and was brought to the cath lab.  We were  unable to place a wire down the left anterior descending artery out into  the diagonal vessel.  The wire kept going up into the LAD proper which  was completely occluded at that point.  We tried for about an hour and  half and were unsuccessful.  We stopped the procedure at that time and  the  patient was admitted for observation because of the multiple wire  manipulations.  She will be  discharged on the above-noted medications and will continue with medical  therapy.  I do not think it would be wise to refer her for bypass  grafting of this vessel as she has a patent LIMA to the LAD.  The LIMA  certainly would be at risk if she would have undergo repeat surgery.  We  will continue with above medications.           ______________________________  Vesta Mixer, M.D.     PJN/MEDQ  D:  07/20/2007  T:  07/20/2007  Job:  [id number]   cc:   Rolm Gala, M.D.

## 2010-06-25 NOTE — Cardiovascular Report (Signed)
NAME:  [name], [name] NO.:  [id number]   MEDICAL RECORD NO.:  [id number]          PATIENT TYPE:  OIB   LOCATION:  NA                           FACILITY:  [facility]   PHYSICIAN:  Vesta Mixer, M.D. DATE OF BIRTH:  November 27, 1923   DATE OF PROCEDURE:  07/02/2007  DATE OF DISCHARGE:                            CARDIAC CATHETERIZATION   Sydney Moore is an 77 year old female with a history of coronary artery  disease.  She had CABG 19 years ago.  She was referred for heart  catheterization after having episodes of angina.   The right femoral artery was easily cannulated using a modified  Seldinger technique.   HEMODYNAMIC RESULTS:  LV pressure is 120/10 with an aortic pressure of  118/54.   Angiography left main.  The left main has minor luminal irregularities.   Left anterior descending artery has a 90% proximal stenosis and is then  occluded after giving off a small first diagonal vessel.  The first  diagonal artery has a 90% stenosis at its origin.   There is a ramus intermediate branch appears to have a very complex  origin.  There is a 90% stenosis in the origin of this vessel.  There  also appears to be a small branch coming to the ramus intermediate  vessel that is severely and diffusely diseased.   The left circumflex artery has severe diffuse disease.  It is occluded  after giving off a first obtuse marginal artery.  The obtuse marginal  artery is also occluded and fills very slowly.  There is dye hang up in  the first OM.   The right coronary artery has severe diffuse disease between 60%  and  70% throughout its course.  There is a mid 90% stenosis followed by a  95% stenosis.  Competitive flow can be seen filling from the graft.   The saphenous vein graft to the right coronary artery is normal.  There  is brisk flow.  The saphenous vein graft to the obtuse marginal artery  is occluded.  The left internal mammary artery to LAD reveals a widely  patent  left internal mammary artery.  The LAD has minor luminal  irregularities.   The left ventriculogram was performed in a 30 RAO position.  Reveals  overall well-preserved left ventricular systolic function.  Ejection  fraction approximately 50%.  There appears to be a mid left ventricular  obstruction (dynamic obstruction).   COMPLICATIONS:  None.   CONCLUSION:  Severe native coronary artery disease.  She has a ramus  intermediate vessel with a 90% stenosis that may be the cause of some of  her angina.  We will consider percutaneous transluminal coronary  angioplasty and stenting of this lesion.  We also may need to consider  adding some beta-blockers and she appears to have left ventricular  outflow tract obstruction.           ______________________________  Vesta Mixer, M.D.     PJN/MEDQ  D:  07/02/2007  T:  07/03/2007  Job:  [id number]   cc:   Rolm Gala, M.D.

## 2010-06-25 NOTE — H&P (Signed)
NAME:  Sydney Moore, Sydney Moore NO.:  [id number]   MEDICAL RECORD NO.:  [id number]          PATIENT TYPE:  OIB   LOCATION:  1999                         FACILITY:  [facility]   PHYSICIAN:  Vesta Mixer, M.D. DATE OF BIRTH:  1923-12-19   DATE OF ADMISSION:  07/02/2007  DATE OF DISCHARGE:  07/02/2007                              HISTORY & PHYSICAL   HISTORY OF PRESENT ILLNESS:  [name] is an elderly female with  a history of coronary artery disease.  She is status post coronary  artery bypass grafting 20 years ago.  She has a history bradycardia and  is also status post pacemaker implantation.  She recently had a heart  catheterization which revealed an 80%-90% stenosis in the proximal LAD,  which did not provide flow to her first diagonal artery.  The native LAD  is actually occluded and fills the graft.  We were hoping to treat her  medically, but she has continued to have episodes of chest tightness.  She is now admitted for PCI of her proximal LAD/first diagonal artery.   She continues to have episodes of chest pain, relieved with  nitroglycerin.   CURRENT MEDICATIONS:  1. Synthroid 0.05 mg a day.  2. Aspirin 81 mg a day.  3. Potassium chloride 10 mEq a day.  4. Metformin 250 mg a day.  5. Furosemide 40 mg a day.  6. Amlodipine/benazepril 10/20 mg once a day.  7. Imdur 60 mg a day.  8. Carvedilol 12.5 mg p.o. b.i.d.  9. Plavix 75 mg a day.   ALLERGIES:  None.   PAST MEDICAL HISTORY:  1. Coronary artery disease.  She is status post coronary artery bypass      grafting at [facility] by Dr. Myrtis Ser.  2. Congestive heart failure.  Ejection fraction is around 35%.  3. Hypercholesterolemia.  4. Hypertension.  5. Left ventricular hypertrophy.   SOCIAL HISTORY:  The patient used to smoke but quit in 1975.  She does  not drink alcohol.   REVIEW OF SYSTEMS:  Reviewed and is essentially negative except as noted  in the HPI.   PHYSICAL EXAMINATION:   GENERAL:  She is an elderly female in no acute  distress.  She is alert and oriented x3.  Mood and affect are normal.  VITAL SIGNS:  Her weight is 133 and blood pressure is 160/80.  HEENT:  Carotids 2+ .  No bruits.  No JVD.  No thyromegaly.  LUNGS:  Clear to auscultation.  HEART:  Regular rate S1 and S2.  ABDOMINAL:  Good bowel sounds and is nontender.  EXTREMITIES:  No clubbing, cyanosis, or edema.  NEURO:  Nonfocal.   ASSESSMENT/PLAN:  Ms. Vidaurri presents with persistent episodes of angina.  I have reviewed her films again and I do think that we can stent her  proximal LAD.  My hope was that this would not cause her angina but  certainly it seems to be causing her some problems.  We will anticipate  PTCA and stenting of her proximal LAD.  We will also increase her  carvedilol  to 25 mg twice a day which will also help with her blood  pressure.  I will see her in the hospital later this week.           ______________________________  Vesta Mixer, M.D.     PJN/MEDQ  D:  07/14/2007  T:  07/15/2007  Job:  [id number]   cc:   Rolm Gala, M.D.

## 2010-06-27 ENCOUNTER — Telehealth: Payer: Self-pay | Admitting: *Deleted

## 2010-06-27 ENCOUNTER — Telehealth: Payer: Self-pay | Admitting: Family Medicine

## 2010-06-27 NOTE — Telephone Encounter (Signed)
Called pt and advised to f/up with Dr.[place] and re-check thumbs. Do not see x-ray order in chart. Pt agreed. Lorenda Hatchet, Renato Battles

## 2010-06-27 NOTE — Telephone Encounter (Signed)
Ms. Minchew is requesting a referral to have xrays taken of there thumbs where she had fallen some weeks ago.  Provider offered to have scanned if pt was still having issues.  Please call her with appt info.

## 2010-06-28 NOTE — Procedures (Signed)
[place]  Patient:    Sydney Moore, Sydney Moore                       MRN: [id number] Proc. Date: 07/24/99 Adm. Date:  [id number] Disc. Date: [id number] Attending:  Charna Elizabeth                           Procedure Report  DATE OF BIRTH:  October 21, 1933  REFERRING PHYSICIAN:  PROCEDURE PERFORMED:  Colonoscopy.  ENDOSCOPIST:  Anselmo Rod, M.D.  INSTRUMENT USED:  Olympus video colonoscope.  INDICATIONS FOR PROCEDURE:  A 77 year old black female with a history of colonic polyps, rule out recurrent polyps.  PREPROCEDURE PREPARATION:  Informed consent was procured from the patient. The patient was fasted for eight hours prior to the procedure and prepped with a bottle of magnesium citrate and a gallon of NuLytely the night prior to the procedure.  PREPROCEDURE PHYSICAL:  The patient had stable vital signs.  Neck supple. Chest clear to auscultation.  S1, S2 regular.  There is a midline scar present from previous coronary artery bypass grafting.  Abdomen soft with normal abdominal bowel sounds.  DESCRIPTION OF PROCEDURE:  The patient was placed in the left lateral decubitus position and sedated with 50 mg of Demerol and 5 mg of Versed intravenously.  Once the patient was adequately sedated and maintained on low-flow oxygen and continuous cardiac monitoring, the Olympus video colonoscope was advanced from the rectum to the cecum with some difficulty secondary to extensive left-sided diverticulosis.  There was a large amount of residual stool in the colon.  No large masses, polyps, erosions, ulcerations were seen.  The diverticular changes predominantly in the left colon.  The cecum, appendicular orifice, right colon and transverse colon appeared normal. There were moderate-sized nonbleeding internal hemorrhoids seen on retroflexion in the rectum.  IMPRESSION: 1. Extensive left-sided diverticulosis. 2. Moderate-sized nonbleeding internal  hemorrhoids. 3. No massesl or polyps seen.  RECOMMENDATIONS: 1. The patient has been advised to have repeat colonoscopy in the next    10 years or earlier should she develop any symptoms in the interim. 2. A high fiber diet void of nuts, seeds and popcorn has been advocated. 3. Outpatient follow-up is advised as needed on p.r.n. basis. DD:  07/24/99 TD:  07/26/99 Job: [id number]

## 2010-06-28 NOTE — H&P (Signed)
NAME:  Sydney Moore, Sydney Moore NO.:  [id number]   MEDICAL RECORD NO.:  [id number]            PATIENT TYPE:   LOCATION:                                 FACILITY:   PHYSICIAN:  Vesta Mixer, M.D.      DATE OF BIRTH:   DATE OF ADMISSION:  DATE OF DISCHARGE:                                HISTORY & PHYSICAL   DATE OF OFFICE VISIT:  Jun 11, 2005   Baley Shands is an elderly female with a history of coronary artery disease.  She is status post coronary artery bypass grafting many years ago.  She had  a heart catheterization in 2000 which revealed severe native coronary artery  disease but with a patent LIMA to LAD graft.  She also had a patent  saphenous vein graft to the right coronary artery and a patent saphenous  vein graft to the obtuse marginal artery.  She had super normal left  ventricular function at that time.   She recently started having some episodes of fatigue and shortness of  breath.  She really has not had much angina but notes that she really has  never had much in the way of angina.  She had a stress Cardiolite study  which revealed moderately depressed left ventricular systolic function which  is clearly new from her previous evaluations.  There was also marked  hypokinesis in the apex in the inferior wall.  She denies any syncope or  presyncope.  She denies any PND or orthopnea.  She denies any cough or  sputum production.   CURRENT MEDICATIONS:  1.  Zocor 20 mg a day.  2.  Zyrtec as needed.  3.  Aspirin 81 mg a day.  4.  Atenolol 100 mg a day.  5.  Potassium chloride 10 mEq a day.  6.  Metformin 250 mg q.h.s.  7.  Norvasc 10 mg a day.  8.  Furosemide 40 mg a day.  9.  Synthroid once a day (dose unknown).   She has no allergies.   PAST MEDICAL HISTORY:  1.  Coronary artery disease.  She is status post coronary artery bypass      grafting.  2.  Diabetes mellitus.  3.  Hypothyroidism.   SOCIAL HISTORY:  The patient used to smoke but quit  in 1975.  She does not  drink alcohol.   FAMILY HISTORY:  Unremarkable.   REVIEW OF SYSTEMS:  Reviewed and is essentially negative except as noted in  HPI.   PHYSICAL EXAMINATION:  GENERAL:  On exam, she is an elderly female in no  acute distress.  She is alert nor did x3 and her mood and affect are normal.  Her  weight is 140, blood pressure 140/70 with a heart rate of 76.  HEENT:  Exam reveals 2+ carotids. She has no bruits, no JVD, no thyromegaly.  LUNGS:  Clear to auscultation.  HEART: Regular rate S1-S2. She has no murmurs.  ABDOMEN:  Her abdominal exam reveals good bowel sounds and  is nontender.  EXTREMITIES: She has no clubbing, cyanosis or  edema.  NEUROLOGIC:  Nonfocal.   Mr. Jurgensen presents with an abnormal Cardiolite study with new decrease in  her left ventricular systolic function.  I suggest that we repeat a heart  catheterization.  We have discussed the risks, benefits and options of heart  catheterization.  She understands and agrees to proceed.           ______________________________  Vesta Mixer, M.D.     PJN/MEDQ  D:  06/12/2005  T:  06/12/2005  Job:  [id number]   cc:   Merlene Laughter. Renae Gloss, M.D.  Fax: [phone]

## 2010-06-28 NOTE — Discharge Summary (Signed)
NAME:  Sydney Moore, Sydney Moore                ACCOUNT NO.:  [id number]   MEDICAL RECORD NO.:  [id number]          PATIENT TYPE:  INP   LOCATION:  4739                         FACILITY:  [facility]   PHYSICIAN:  Vesta Mixer, M.D. DATE OF BIRTH:  [birth year]   DATE OF ADMISSION:  03/30/2006  DATE OF DISCHARGE:  04/01/2006                               DISCHARGE SUMMARY   DISCHARGE DIAGNOSES:  1. Noncardiac chest pain.  2. History of coronary artery disease.  3. Hypothyroidism.  4. Recent onset of night sweats.  5. Hypertension.  6. Diabetes mellitus.  7. Hyperlipidemia.   DISCHARGE MEDICATIONS:  1. Amlodipine 10 mg a day.  2. Aspirin 81 mg a day.  3. Coreg CR 40 mg a day (we will change her to a generic carvedilol at      her next office visit).  4. Lasix 40 mg a day.  5. Synthroid 0.05 mg a day.  6. Metformin 250 mg once a day.  7. Potassium chloride 10 mEq a day.  8. Zocor 40 mg a day.  9. Benazepril 20 mg a day.   DISPOSITION:  The patient will see Dr. Elease Hashimoto in 1 to 2 weeks.  She is  to see Dr. Renae Gloss for evaluation of her night sweats in the next week  or so.   HISTORY:  [name] is an 77 year old female with a history of coronary  artery disease and a pacemaker.  She was admitted through the emergency  room with an episode of chest pain.  Please see dictated H&P for further  details.   HOSPITAL COURSE:  By problems:  1. Chest pain.  The patient was ruled out for myocardial infarction.      She did not have any further episodes of chest pain.  We will make      sure that she can ambulate safely without any problems.  We will      schedule her for an outpatient Cardiolite study.  Her last heart      catheterization was a year and a half ago.  She was found to have      an occluded circumflex graft, but her other grafts were patent.  2. Pacemaker.  We will see her at [facility] within the next several      months.  3. Night sweats.  The patient continues to complain of  having night      sweats and waking up suddenly at night.  Her TSH was found to be      normal.  She did not have any worsening arrhythmias.  We will have      her followup with Dr. Renae Gloss for this issue.  All of her other      medical problems are stable.           ______________________________  Vesta Mixer, M.D.     PJN/MEDQ  D:  04/01/2006  T:  04/02/2006  Job:  [id number]   cc:   Merlene Laughter. Renae Gloss, M.D.

## 2010-06-28 NOTE — H&P (Signed)
NAME:  Sydney Moore, Sydney Moore                          ACCOUNT NO.:  [id number]   MEDICAL RECORD NO.:  [id number]                   PATIENT TYPE:  INP   LOCATION:  4740                                 FACILITY:  [facility]   PHYSICIAN:  Vesta Mixer, M.D.              DATE OF BIRTH:  11/07/1923   DATE OF ADMISSION:  08/25/2002  DATE OF DISCHARGE:                                HISTORY & PHYSICAL   HISTORY OF PRESENT ILLNESS:  Ms. Raben is a 77 year old black female with a  history of coronary artery disease who was admitted to the hospital with  weakness, dizziness, and a heart rate of 32. The patient has a long history  of coronary artery disease. She is status post coronary artery bypass  grafting in 1989 at [facility] She also has a history of  hyperglycemia, diabetes mellitus, and hypertension.   She had a heart catheterization in March 2000 which revealed severe native  coronary artery disease and relatively patent saphenous vein grafts. Her  left anterior descending artery had an 80 to 90% stenosis. The left  circumflex artery had a 100% occlusion, and the right coronary artery had a  100% occlusion. The left internal mammary artery to the LAD was normal. The  saphenous vein graft to the right coronary artery had a 30% stenosis  proximally. The saphenous vein graft to the obtuse marginal artery had minor  luminal irregularities. Her left ventricular systolic function was actually  above normal with an EF of around 75 to 80%.   The patient has done quite well for years. She has been on the same dose of  atenolol for years. She stated having some episodes of weakness and fatigue  earlier this week. She was seen by her medical doctor and thought to have a  viral illness. She came to the office today and was found to a heart rate of  30. She is now admitted for further evaluation.   The patient has had lots dizziness of weakness when she stands up. When she  sits down, she  feels quite a bit better. She has had some occasional  episodes of chest tightness but states that it does not feel like her  previous episodes of chest tightness many years. She does not have what she  would call true angina. She has tried taking some nitroglycerin, but this  did not help.   CURRENT MEDICATIONS:  1. Zocor 20 mg a day.  2. Aspirin once a day.  3. Atenolol 100 mg a day.  4. Procardia XL 90 mg a day.  5. Altace 10 mg a day.  6. Metformin 250 mg a day.   ALLERGIES:  No known drug allergies.   PAST MEDICAL HISTORY:  1. Coronary artery disease, status post coronary artery bypass grafting.  2. Hypercholesterolemia.  3. Hypertension.  4. Left ventricular hypertrophy.  5. Diabetes mellitus.   SOCIAL  HISTORY:  The patient has a remote history of cigarette smoking.   FAMILY HISTORY:  Noncontributory.   REVIEW OF SYSTEMS:  She is somewhat fatigued but otherwise her review of  systems is negative.   PHYSICAL EXAMINATION:  GENERAL:  On exam, she is a middle-aged female in no  acute distress, but she does appear to be tired.  VITAL SIGNS:  Her heart rate is 30. Her blood pressure is 138/64.  HEENT:  Her HEENT exam reveals 2+ carotids. She has no bruits. There is no  JVD and no thyromegaly.  LUNGS:  Clear to auscultation.  HEART:  Regular rate and S1 and S2 and quite bradycardic.  ABDOMINAL EXAM:  Reveals good bowel sounds, nontender.  EXTREMITIES:  She has no clubbing, cyanosis, or edema.  NEUROLOGICAL:  Nonfocal.   LABORATORY DATA:  Her EKG reveals normal sinus rhythm with complete AV  block. She has a right bundle branch block pathology ventricular escape  beats.   ASSESSMENT/PLAN:  The patient presents with weakness and dizziness. I  suspect that most of her symptoms are due to her bradycardia. She has been  on the same dose of atenolol for quite some time although this certainly  could be contributing to the problem. I can not exclude the possibility that  she  has some ischemia in the inferior distribution. We will admit her to the  hospital and hold her atenolol. We will check her TSH and check her cardiac  enzymes. Dr. Deborah Chalk will see her tomorrow. She may need a heart  catheterization at some point or at least a Cardiolite study. Her medical  doctor is Dr. Andi Devon, if we need some noncardiac issues resolved.  All of her medical problems appear to be stable for time being.                                               Vesta Mixer, M.D.    PJN/MEDQ  D:  08/25/2002  T:  08/25/2002  Job:  [id number]   cc:   Colleen Can. Deborah Chalk, M.D.  [address]  [place]  [place] [address]  Fax: [phone]   Merlene Laughter. Renae Gloss, M.D.  [address]  [address]  [place]  [place] [address]  Fax: [phone]    cc:   Colleen Can. Deborah Chalk, M.D.  [address], [address]  [place]  [place] [address]  Fax: [phone]   Merlene Laughter. Renae Gloss, M.D.  [address]  [address]  [place]  [place] [address]  Fax: [phone]

## 2010-06-28 NOTE — H&P (Signed)
NAME:  AIREL, MAGADAN NO.:  [id number]   MEDICAL RECORD NO.:  [id number]          PATIENT TYPE:  INP   LOCATION:  1844                         FACILITY:  [facility]   PHYSICIAN:  Ulyses Amor, MD DATE OF BIRTH:  [birth year]   DATE OF ADMISSION:  03/30/2006  DATE OF DISCHARGE:                              HISTORY & PHYSICAL   Sydney Moore is an 77 year old black woman who is admitted to [facility] for further evaluation of chest pain.   The patient has a history of coronary artery disease which dates back to  34.  At that time, she underwent coronary artery bypass surgery.  She  has been evaluated with cardiac catheterization at various points along  the way.  The last cardiac catheterization was performed in May of 2007.  This demonstrated occlusion of the saphenous vein graft to the obtuse  marginal.  This was felt to have occurred some time ago.  It was not  felt that there would be any significant benefit in opening up that vein  graft given the age of the graft and that it would not likely stay open.  In addition, it was felt to supply nonviable myocardium.  Thus,  continued medical therapy was recommended.  The saphenous vein graft to  the right coronary artery was patent.  The left internal mammary artery  graft to the LAD was patent.   The patient presented to the emergency department after experiencing a  20 minute episode of chest pain this evening.  This required 4  nitroglycerin tablets for relief.  The chest pain was described as a  midsubsternal pressure.  It did not radiate.  It was not associated with  dyspnea, diaphoresis or nausea.  It began at rest.  There were no  exacerbating or ameliorating factors.  At the time she presented to the  emergency department, she was no longer experiencing chest pain.  She  notes having experienced a similar episode of chest pain last week.  The  patient does not usually experience chest pain.  She  notes that this  chest pain was similar in character to her past cardiac chest pain.   The patient has a number of risk factors for coronary artery disease  including hypertension, dyslipidemia and diabetes mellitus.  There is no  history of smoking or family history of coronary artery disease.   The patient has a pacemaker for complete heart block.  She also has  hypothyroidism.   MEDICATIONS:  Amlodipine, aspirin, Coreg, Lasix, levothyroxine,  metformin, nitroglycerin and potassium chloride.   ALLERGIES:  None.   OPERATION:  Coronary artery bypass surgery.   SOCIAL HISTORY:  The patient lives alone.  She neither smokes cigarettes  nor drinks alcohol.   FAMILY HISTORY:  There is no family history of coronary artery disease.   REVIEW OF SYSTEMS:  Reveals no new problems related to her head, eyes,  ears, nose, mouth, throat, lungs, gastrointestinal system, genitourinary  system or extremities.  There is no history of neurologic or psychiatric  disorder.  There is no history of  fever, chills or weight loss.   PHYSICAL EXAMINATION:  Blood pressure 164/85, pulse 79 and regular,  respirations 20, temperature 97.8.  The patient was an elderly black  woman in no discomfort.  She was alert, oriented, appropriate and  responsive.  Head, eyes, nose and mouth were normal. The neck was  without thyromegaly or adenopathy.  Carotid pulses were palpable  bilaterally and without bruits.  Cardiac examination revealed a normal  S1-S2.  There was no S3, S4, murmur, rub or click.  Cardiac rhythm was  regular.  No chest wall tenderness was noted.  The lungs were clear.  The abdomen was soft and nontender.  There was no mass,  hepatosplenomegaly, bruit, distention, rebound, guarding or rigidity.  Bowel sounds were normal.  Breasts, pelvic and rectal examinations were  not performed as they were not pertinent to the reason for acute care  hospitalization.  The extremities were without edema,  deviation or  deformity.  Radial and dorsalis pedis pulses were palpable bilaterally.  Brief screening neurologic survey was unremarkable.   The electrocardiogram revealed a paced rhythm.  The chest radiograph,  according to the radiologist, demonstrated cardiomegaly with no acute  findings.  The initial set of cardiac markers revealed a myoglobin of  121, CK-MB 3.1 and troponin less than 0.05.  White count was 5.9 with a  hemoglobin of 12.7 and hematocrit of 37.2.  Potassium was 4.1, BUN 10  and creatinine 1.0.  The remaining studies were pending at the time of  this dictation.   IMPRESSION:  1. Chest pain:  Rule out unstable angina.  2. Coronary artery disease, status post coronary artery bypass surgery      in 1989:  The last cardiac catheterization was performed in May of      2007 and is detailed above.  3. Congestive heart failure, ejection fraction 35-40%.  4. Permanent pacemaker.  5. Hypertension.  6. Dyslipidemia.  7. Diabetes mellitus.  8. Hypothyroidism.   PLAN:  1. Telemetry.  2. Serial cardiac enzymes.  3. Aspirin.  4. Intravenous heparin.  5. Intravenous nitroglycerin.  6. Further measures per Dr. Elease Hashimoto.      Ulyses Amor, MD  Electronically Signed     MSC/MEDQ  D:  03/31/2006  T:  03/31/2006  Job:  [id number]   cc:   Vesta Mixer, M.D.

## 2010-06-28 NOTE — H&P (Signed)
NAME:  Sydney Moore, Sydney Moore                          ACCOUNT NO.:  [id number]   MEDICAL RECORD NO.:  [id number]                   PATIENT TYPE:  INP   LOCATION:  4740                                 FACILITY:  [facility]   PHYSICIAN:  Vesta Mixer, M.D.              DATE OF BIRTH:  April 17, 1923   DATE OF ADMISSION:  08/25/2002  DATE OF DISCHARGE:  08/28/2002                                HISTORY & PHYSICAL   HISTORY OF PRESENT ILLNESS:  Sydney Moore is an elderly female with a history  of coronary artery disease, hypercholesterolemia and hypertension.  She  presented today with severe fatigue, weakness and dizziness.  She was seen  in the office and found to have a heart rate of 30 and was admitted to the  hospital.  She also states that she has had some episodes of chest pain.   Sydney Moore has been relatively healthy for many years.  She had coronary  artery bypass grafting many years ago at [facility]  She had a  heart catheterization in 2000 which revealed fairly patent grafts.  She has  done well and was last seen in office in March.  At that time, her heart  rate was 62 and she was basically asymptomatic.  Approximately one week ago  she started having weakness, dizziness and fatigue.  She also has had some  episodes of chest pain relieved with nitroglycerin.   CURRENT MEDICATIONS:  1. Zocor 20 mg a day.  2. Aspirin once a day.  3. Procardia XL 90 mg a day.  4. Atenolol 100 mg a day.  5. Hydrochlorothiazide 12.5 mg a day.  6. Altace 10 mg a day.  7. Metformin 500 mg half a tablet twice a day.   ALLERGIES:  She has no known drug allergies.   PAST MEDICAL HISTORY:  1. Coronary artery disease.  2. Dyslipidemia.  3. Diabetes mellitus.   SOCIAL HISTORY:  The patient is a nonsmoker.   FAMILY HISTORY:  Her family history is noncontributory.   REVIEW OF SYSTEMS:  Review of systems was reviewed and is essentially  negative except for as noted in the HPI.   PHYSICAL  EXAMINATION:  GENERAL:  On exam, she is an elderly female in no  acute distress.  She is alert and oriented x3 and her mood and affect are  normal.  Her weight is 134.  VITAL SIGNS:  Her blood pressure is 138/64 with a heart rate of 30.  HEENT/NECK:  Exam reveals 2+ carotids.  She has no bruits.  There is no JVD  and no thyromegaly.  LUNGS:  Lungs are clear to auscultation.  HEART:  Regular rate _____________but is very bradycardic.  ABDOMEN:  Abdominal exam reveals good bowel sounds and is nontender.  EXTREMITIES:  She has no clubbing, cyanosis, or edema.  NEUROLOGIC:  Exam is nonfocal.   LABORATORY AND ACCESSORY CLINICAL DATA:  EKG reveals normal sinus rhythm  with a complete A-V block.  She has a ventricular escape rhythm.  The A-V  block was new since her previous EKG.   ASSESSMENT AND PLAN:  Sydney Moore presents with severe bradycardia and  complete atrioventricular heart block.  She is not having any real evidence  of ischemia.  At this point, I would like to admit her to the hospital for  pacemaker implantation.  I suspect that a lot of her chest pain may be  related to her bradycardia and should resolve after pacemaker placement.  She may or may not need a heart catheterization, depending on the rest of  her hospitalization.  All of her other medical problems are stable.                                                Vesta Mixer, M.D.    PJN/MEDQ  D:  09/06/2002  T:  09/07/2002  Job:  [id number]   cc:   Merlene Laughter. Renae Gloss, M.D.  [address]  [address]  [place]  [place] [address]  Fax: [phone]

## 2010-06-28 NOTE — Cardiovascular Report (Signed)
   NAME:  Sydney Moore, Sydney Moore                          ACCOUNT NO.:  [id number]   MEDICAL RECORD NO.:  [id number]                   PATIENT TYPE:  INP   LOCATION:  4740                                 FACILITY:  [facility]   PHYSICIAN:  Colleen Can. Deborah Chalk, M.D.            DATE OF BIRTH:  1923/05/18   DATE OF PROCEDURE:  08/26/2002  DATE OF DISCHARGE:                              CARDIAC CATHETERIZATION   PROCEDURE:  Implantation of dual-chamber pulse generator under fluoroscopy.   INDICATIONS FOR PROCEDURE:  Complete heart block.   PROCEDURE:  The right subclavicular area was prepped and draped.  The area  was then infiltrated with 1% Xylocaine.  Subcutaneous pocket was created to  the prepectoral fascia.  Using 7 French Cook introducers, the atrial and  ventricular leads were introduced. Ventricular lead was a Guidant model 4470  52-cm lead, serial [id number] active fixation lead.  The thresholds were 0.65 V  to capture at 0.5 msec.  Resistance was 440 ohms.  R waves were 9.2 mV and  1.7 MA current.  The atrial lead was a Guidant model 4469 45-cm lead, serial  number [id number].  Thresholds were 0.8 V to capture 0.5 msec, pulse width 400  ohms, impedance with a 2.8 mV P wave and 2.7 MA current.  Leads were sutured  in place.  Wound  was flushed with kanamycin solution.  The leads were  connected to Medtronic KDR 901, serial number [id number].  The unit was  sutured in place.  The wound was closed with 2-0 and subsequently 5-0 Dexon  and Steri-Strips were applied.   Overall, the patient tolerated the procedure well.                                                 Colleen Can. Deborah Chalk, M.D.    SNT/MEDQ  D:  08/26/2002  T:  08/27/2002  Job:  [id number]  Merlene Laughter. Renae Gloss, M.D.  [address]  [address]  [place]  [place] [address]  Fax: [phone]   cc:   Merlene Laughter. Renae Gloss, M.D.  [address]  [address]  [place]  [place] [address]  Fax: [phone]

## 2010-06-28 NOTE — Cardiovascular Report (Signed)
NAME:  [name], [name] NO.:  [id number]   MEDICAL RECORD NO.:  [id number]          PATIENT TYPE:  OIB   LOCATION:  1962                         FACILITY:  [facility]   PHYSICIAN:  Vesta Mixer, M.D. DATE OF BIRTH:  November 09, 1923   DATE OF PROCEDURE:  06/18/2005  DATE OF DISCHARGE:  06/18/2005                              CARDIAC CATHETERIZATION   HISTORY OF PRESENT ILLNESS:  Sydney Moore is a 77 year old female with a  history of coronary artery bypass grafting back in 1989.  She presented  recently with some shortness breath.  An abnormal Cardiolite revealed  decreased left ventricular systolic function.  She was also found to have  marked hypokinesis in the inferior wall.   She was scheduled for heart catheterization for further evaluation.   PROCEDURE:  Left heart catheterization with coronary angiography.  The right  femoral artery was easily cannulated using the modified Seldinger technique.   HEMODYNAMICS:  LV pressure is 145/17 with an aortic pressure of 146/58.   ANGIOGRAPHY, LEFT MAIN:  The left main has minor luminal irregularities.   The left anterior descending artery has a 90% stenosis at the origin and is  then occluded after giving off a moderate-sized first diagonal branch.  The  diagonal branch has a long 90% stenosis.  The diagonal is otherwise  unremarkable.   The left circumflex artery is occluded proximally.  It gives off a very  small marginal branch and some collateral field marginal branches.   The right coronary artery is noted to be occluded.  A flash shot revealed no  significant flow down the right coronary artery.   The saphenous vein graft to the right coronary artery is normal.  Anastomosis to the RCA is normal.  The posterior descending artery and  posterolateral segment artery are unremarkable.   The saphenous vein graft to the obtuse marginal artery is occluded at its  midpoint.  It has very sluggish flow.  There is lots of  clot and  degeneration of this graft.  The flow never actually reaches the end of the  obtuse marginal artery.   The left internal mammary artery is a large vessel.  Anastomosis to the LAD  is normal.  The LAD has minor luminal irregularities.   The left ventriculogram was performed in a 30 RAO position.  It reveals  moderate left ventricular dysfunction.  The ejection fraction is between 35  and 40%.  There is inferolateral apical akinesis with a slight aneurysm.  There is 1 to 2+ mitral regurgitation.   COMPLICATIONS:  None.   CONCLUSION:  1.  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      This has occurred some time over the past year or so.  It does not      appear to be acute and the tract appears to be fairly old.  I do not      think that there would be any significant benefit in opening up the      saphenous vein graft.  The graft is 75 years old and would  not likely      stay open.  In addition, the flow through this vessel would be very      minimal, since she has already had a myocardial infarction.  She has      akinesis/dyskinesis of this region.  We will continue with aggressive      medical therapy.  We will diurese her as much as possible.  We will      continue medical therapy.           ______________________________  Vesta Mixer, M.D.     PJN/MEDQ  D:  06/18/2005  T:  06/19/2005  Job:  [id number]   cc:   Merlene Laughter. Renae Gloss, M.D.  Fax: [phone]

## 2010-06-28 NOTE — Op Note (Signed)
NAME:  Sydney Moore, Sydney Moore                          ACCOUNT NO.:  [id number]   MEDICAL RECORD NO.:  [id number]                   PATIENT TYPE:  OIB   LOCATION:  2871                                 FACILITY:  [facility]   PHYSICIAN:  Salley Scarlet., MD           DATE OF BIRTH:  06/21/1923   DATE OF PROCEDURE:  12/06/2001  DATE OF DISCHARGE:                                 OPERATIVE REPORT   PREOPERATIVE DIAGNOSIS:  Immature cataract, left eye.   POSTOPERATIVE DIAGNOSIS:  Immature cataract, left eye.   PROCEDURE:  Kelman phacoemulsification of cataract, left eye.   ANESTHESIA:  Local using Xylocaine 2% with Marcaine 0.75% and Wydase.   INDICATION FOR PROCEDURE:  This 77 year old lady underwent cataract  extraction from the right eye several months ago with good visual result.  This, however, has left her with anisometropia with complaints of difficulty  seeing to read.  She was evaluated and found to have a visual acuity best  corrected to 20/20 on the right, 20/40 on the left.  There was an  intraocular lens present on the right, but there was a 2+ nuclear sclerotic  cataract on the left.  Cataract extraction with intraocular lens  implantation was recommended so as to alleviate the anisometropia and its  symptoms.  She was carefully explained as to the risks and benefits of  cataract surgery.  She was therefore admitted at this time for removal of  the cataract from the left eye.   DESCRIPTION OF PROCEDURE:  Under the influence of IV sedation, a Van Lint  akinesia and retrobulbar anesthesia were given.  The patient was prepped and  draped in the usual manner.  The lid speculum was inserted under the upper  and lower lid of the left eye, and a 4-0 silk traction suture was passed  through the belly of the superior rectus muscle for traction.  A fornix-  based conjunctival flap was turned and hemostasis achieved by using the  cautery.  An incision was made in the sclera at the  limbus.  This incision  was dissected down to clear cornea using the crescent blade.  A side port  incision was made at the 1:30 clock hour position. Occucoat was injected  into the eye through the side port incision.  The anterior chamber was then  entered through the corneoscleral tunnel incision at the 11:30 clock hour  position, and an anterior capsulotomy was done using a bent 25-gauge needle.  The nucleus was hydrodissected using Xylocaine.  The KPE handpiece was  passed into the eye, and the nucleus was emulsified without difficulty.  The  residual cortical material was aspirated.  The posterior capsule was  polished using the olive-tip polisher.  A foldable silicone lens was  inserted into the eye behind the iris without difficulty.  The residual  Occucoat was aspirated from the eye.  The anterior chamber was reformed and  the  pupil was constricted using Miochol.  The lips of the wound were  hydrated with saline and tested to make sure that there was no leak.  The  conjunctiva was then closed over the wound using thermal cautery.  Celestone  1 cc and 0.5 cc of gentamicin were injected subconjuctivally.  Pilopine  ointment and Maxitrol ointment were applied along with a patch and Fox  shield.  The patient tolerated the procedure well and as discharged to the  postanesthesia care room in satisfactory condition.  She was instructed to  rest today, to take Vicodin every four hours as needed for pain, and see me  in the office tomorrow for further evaluation.   DISCHARGE DIAGNOSIS:  Immature cataract, left eye.                                              Salley Scarlet., MD   TB/MEDQ  D:  12/06/2001  T:  12/07/2001  Job:  [id number]

## 2010-06-28 NOTE — Op Note (Signed)
[facility]  Patient:    Sydney Moore, Sydney Moore Visit Number: [id number] MRN: [id number]          Service Type: DSU Location: [facility] 2899 23 Attending Physician:  Karenann Cai Dictated by:   Marya Landry Carlyle Lipa., M.D. Proc. Date: 09/03/01 Admit Date:  09/02/2001 Discharge Date: 09/02/2001                             Operative Report  PREOPERATIVE DIAGNOSIS:  Immature cataract, right eye.  POSTOPERATIVE DIAGNOSIS:  Immature cataract, right eye.  OPERATION PERFORMED:  Kelman phacoemulsification of cataract, right eye.  SURGEON:  Marya Landry. Carlyle Lipa., M.D.  ANESTHESIA:  Local using Xylocaine 2%, Marcaine 0.75% with Wydase.  INDICATIONS FOR PROCEDURE:  The patient is a 77 year old lady who complained that everything looks dim.  She has difficulty seeing to read.  She was evaluated and found to have a visual acuity best corrected to 20/50 each eye. There were bilateral cataracts slightly worse on the right than the left. Cataract extraction of the right eye was recommended.  She was admitted at this time for that purpose.  DESCRIPTION OF PROCEDURE:  Under the influence of IV sedation and Van Lint akinesia, retrobulbar anesthesia was given.  The prepped and draped in the usual manner.  The lid speculum was inserted under the upper and lower lid of the right eye and a 4-0 silk traction suture was passed through the belly of the superior rectus muscle for traction.  A fornix based conjunctival flap was turned, and hemostasis achieved by using a cautery.  An incision was made in the sclera approximately 1 mm posterior to the limbus.  This incision was dissected down to clear cornea using a crescent blade.  A side port incision was made at the 1:30 oclock position.  OcuCoat was injected into the eye through the side port incision.  The anterior chamber was then entered through the corneoscleral tunnel incision at the 11:30 oclock  position.  An anterior capsulotomy was done using a bent 25 gauge needle and the nucleus was hydrodissected using Xylocaine.  The KPE handpiece was passed into the eye and the nucleus was emulsified without difficulty.  The residual cortical material was aspirated.  The posterior capsule was polished using an olive tip polisher.  The wound was widened slightly to accommodate a foldable silicon lens.  The lens was seated into the eye behind the iris without difficulty. The anterior chamber was reformed and the pupil was constricted using Miochol. The residual OcuCoat was aspirated from the eye.  The lips of the wound were hydrated and tested to make sure there was no leak.  Ascertaining that there was no leak, the conjunctiva was closed over the wound using thermal cautery. 1 cc of Celestone and 0.5 cc of gentamicin were injected subconjunctivally. Maxitrol ophthalmic ointment and Pilopine ointment were applied along with the patch and Fox shield.  The patient tolerated the procedure well and was discharged to the post anesthesia care unit in satisfactory condition.  She is instructed to rest today, to take Vicodin every four hours as needed for pain and to see me in the office for further evaluation.  DISCHARGE DIAGNOSES:  Immature cataract, right eye. Dictated by:   Marya Landry Carlyle Lipa., M.D. Attending Physician:  Karenann Cai DD:  09/03/01 TD:  09/06/01 Job: [id number]

## 2010-06-28 NOTE — Discharge Summary (Signed)
NAME:  Sydney Moore, Sydney Moore                          ACCOUNT NO.:  [id number]   MEDICAL RECORD NO.:  [id number]                   PATIENT TYPE:  INP   LOCATION:  4740                                 FACILITY:  [facility]   PHYSICIAN:  Colleen Can. Deborah Chalk, M.D.            DATE OF BIRTH:  Apr 29, 1923   DATE OF ADMISSION:  08/25/2002  DATE OF DISCHARGE:  08/28/2002                                 DISCHARGE SUMMARY   DISCHARGE DIAGNOSES:  1. Significant weakness and fatigue in the setting of complete heart block,     status post implantation of a permanent transvenous pacemaker, dual-     chamber Medtronic KDR-901, serial No. [id number].  2. Atherosclerotic cardiovascular disease, currently with negative cardiac     enzymes.  She has had previous coronary artery bypass grafting in 1989,     at [facility] with repeat catheterization in March 2000.  3. Hyperlipidemia.  4. Hypertension.  5. Diabetes.   HISTORY OF PRESENT ILLNESS:  The patient is a very pleasant, 77 year old,  African-American female who presents to the office on August 25, 2002, with  basically episodes of weakness and fatigue that had been occurring for  approximately one week prior to that visit.  There was concern for possible  viral illness.  However, when she came to the office, she was noted to have  a heart rate of 30.  She was subsequently admitted for further evaluation.  She had no true angina.  Please see dictated History and Physical per Dr.  Kristeen Miss for further patient presentation and profile.   LABORATORY DATA AND X-RAY FINDINGS:  Her EKG showed normal sinus rhythm with  complete AV block.  There was a right bundle branch block and pathology with  ventricular beats.   CBC was normal.  Chemistries were satisfactory with glucose 95, BUN 21,  creatinine 1.0 and potassium 3.6.  Cardiac enzymes were negative x3.  B-type  natriuretic peptide was elevated at 940.  TSH was just slightly elevated at   7.362.   HOSPITAL COURSE:  The patient was admitted.  Her Atenolol was discontinued.  The patient still felt quite fatigued and continued to demonstrate complete  heart block.  We proceeded on with permanent transvenous pacemaker  implantation the following day.  That procedure was tolerated well without  any known complications.  She was watched for an extra 24 hours for chest  pain or other adverse reactions.  By August 28, 2002, she was doing well.  She  was up and ambulatory without problems.  Physical exam is unremarkable.  Chest x-ray is stable.  The wound is unremarkable and she was felt to be  stable candidate for discharge.   DISCHARGE MEDICATIONS:  1. Resume all previous medications.  2. Her Atenolol was added back.   SPECIAL INSTRUCTIONS:  Extensive instructions reviewed regarding pacemaker  care, specifically not to raise her right arm above her head  for the next  one to two weeks.   WOUND CARE:  Avoid getting the wound wet and Betadine swabs were made  available for her to paint the wound daily.   FOLLOW UP:  Follow up in the office with Dr. Kristeen Miss in approximately  7-10 days and certainly sooner if problems would arise.     Juanell Fairly C. Earl Gala, N.P.                 Colleen Can. Deborah Chalk, M.D.    LCO/MEDQ  D:  08/28/2002  T:  08/29/2002  Job:  [id number]   cc:   Merlene Laughter. Renae Gloss, M.D.  [address]  [address]  [place]  [place] [address]  Fax: [phone]    cc:   Merlene Laughter. Renae Gloss, M.D.  [address]  [address]  [place]  [place] [address]  Fax: [phone]

## 2010-07-01 ENCOUNTER — Ambulatory Visit: Payer: Medicare Other | Admitting: Family Medicine

## 2010-07-03 ENCOUNTER — Encounter: Payer: Self-pay | Admitting: Family Medicine

## 2010-07-03 ENCOUNTER — Ambulatory Visit
Admission: RE | Admit: 2010-07-03 | Discharge: 2010-07-03 | Disposition: A | Discharge status: Home / Self Care | Payer: Medicare Other | Source: Ambulatory Visit | Attending: Family Medicine | Admitting: Family Medicine

## 2010-07-03 ENCOUNTER — Ambulatory Visit (INDEPENDENT_AMBULATORY_CARE_PROVIDER_SITE_OTHER): Payer: Medicare Other | Admitting: Family Medicine

## 2010-07-03 VITALS — BP 144/67 | HR 77 | Temp 98.2°F | Ht 60.0 in | Wt 139.0 lb

## 2010-07-03 DIAGNOSIS — M79609 Pain in unspecified limb: Secondary | ICD-10-CM

## 2010-07-03 DIAGNOSIS — M79643 Pain in unspecified hand: Secondary | ICD-10-CM

## 2010-07-03 NOTE — Patient Instructions (Signed)
It was nice to meet you today.  I am sending you for xrays.

## 2010-07-04 ENCOUNTER — Telehealth: Payer: Self-pay | Admitting: *Deleted

## 2010-07-04 ENCOUNTER — Encounter: Payer: Self-pay | Admitting: Family Medicine

## 2010-07-04 NOTE — Telephone Encounter (Signed)
Message copied by Tessie Fass on Thu Jul 04, 2010 12:07 PM ------      Message from: Helane Rima      Created: Thu Jul 04, 2010 11:15 AM       Please let patient know that the xrays showed arthritis but no fracture. Continue Tylenol and ice to area. If not improved in 1-2 weeks, f/u with PCP.

## 2010-07-04 NOTE — Assessment & Plan Note (Signed)
Patient with fall, continued pain to bilateral thumbs. Will order XRAYs per PCP. Recommended Tylenol 1000 mg TID prn pain, ice to area for 10 minutes TID, future vitamin D level when patient returns to PCP, and possibly future Voltaren gel if pain not controlled with Tylenol. Normal grasp and function, low likelihood of fracture. Suspect sprain/strain or exacerbation of previous OA. May use brace bilaterally if improves pain.

## 2010-07-04 NOTE — Progress Notes (Signed)
  Subjective:    Patient ID: Sydney Moore, female    DOB: 1923/09/02, 77 y.o.   MRN: [id number]  HPI  1. Thumb Pain:  Bilateral, s/p FOOSH, she stumbled carrying a flower pot, has not taken any extra meds for pain , + swelling in thumbs, seen by PCP last week, offered xrays but declined, wants them done today.       Review of Systems See HPI.    Objective:   Physical Exam GEN: NAD, alert and oriented, vitals reviewed. Hands: Normal grasp, normal ROM at wrist bilat, deformities at DIP on bilateral 2-3rd digits. Left thumb with nodular appearance at DIP, mild TTP at distal phalanx. Right thumb, mild swelling over thumb down to base, TTP over area of swelling-mild, no bruising, no erythema, normal ROM at all fingers.     Assessment & Plan:

## 2010-07-04 NOTE — Telephone Encounter (Signed)
Called and informed patient of message below.Sydney Moore, Rodena Medin

## 2010-07-11 ENCOUNTER — Ambulatory Visit (INDEPENDENT_AMBULATORY_CARE_PROVIDER_SITE_OTHER): Payer: Medicare Other | Admitting: Cardiovascular Disease

## 2010-07-11 ENCOUNTER — Encounter: Payer: Self-pay | Admitting: Cardiovascular Disease

## 2010-07-11 VITALS — BP 158/70 | HR 74 | Ht 63.0 in | Wt 141.4 lb

## 2010-07-11 DIAGNOSIS — I25810 Atherosclerosis of coronary artery bypass graft(s) without angina pectoris: Secondary | ICD-10-CM

## 2010-07-11 DIAGNOSIS — E7800 Pure hypercholesterolemia, unspecified: Secondary | ICD-10-CM

## 2010-07-11 NOTE — Progress Notes (Signed)
Sydney Moore Date of Birth  July 11, 1923 [facility] [address]     [address] [place], [place]  [address] [phone]  Fax  [phone]  History of Present Illness:  Sydney Moore is an elderly female with a history of coronary artery disease. She status post coronary artery bypass grafting. She has a history of complete heart block and is status post pacemaker implantation. She also has a history of hypothyroidism, dyslipidemia, and diabetes mellitus.  She's done very well since I last saw. She has not had any episodes of chest pain or shortness of breath.  Current Outpatient Prescriptions on File Prior to Visit  Medication Sig Dispense Refill  . amLODipine (NORVASC) 10 MG tablet Take 10 mg by mouth daily.        Marland Kitchen aspirin 81 MG tablet Take 81 mg by mouth every morning.        . clobetasol (TEMOVATE) 0.05 % cream Apply to affected on skin twice a day as needed for rash  30 g  1  . clopidogrel (PLAVIX) 75 MG tablet Take 75 mg by mouth daily. Dr. Melburn Popper prescribes       . furosemide (LASIX) 40 MG tablet Take 40 mg by mouth daily. for swelling       . isosorbide mononitrate (IMDUR) 60 MG 24 hr tablet 1/2 tablet by mouth daily       . levothyroxine (SYNTHROID, LEVOTHROID) 50 MCG tablet Take 50 mcg by mouth daily.        . metFORMIN (GLUCOPHAGE) 500 MG tablet Take 1/2  tablet by mouth once a day.       . metoprolol succinate (TOPROL-XL) 25 MG 24 hr tablet Take 25 mg by mouth daily.        Marland Kitchen NITROSTAT 0.4 MG SL tablet       . potassium chloride SA (K-DUR,KLOR-CON) 20 MEQ tablet Take 20 mEq by mouth every morning.        . ranolazine (RANEXA) 500 MG 12 hr tablet Take 500 mg by mouth 2 (two) times daily. Dr. Melburn Popper prescribes       . simvastatin (ZOCOR) 40 MG tablet Take 40 mg by mouth at bedtime.          No Known Allergies  Past Medical History  Diagnosis Date  . Hypertension   . Hyperlipidemia   . MI (myocardial infarction)     Triple Bypass  1989  . Diabetes mellitus   . Chronic cough     Seen by Dr. Sherene Sires  . Carotid bruit     Left, Monitor seen by [facility]  . Thyroid disease     Hypothyroidism  . CAD (coronary artery disease)   . Complete heart block     s/p PPM (MDT) by Dr Deborah Chalk 2004    Past Surgical History  Procedure Date  . Coronary artery bypass graft 1989  . Pacemaker placement 2004    MDT by Dr Deborah Chalk    History  Smoking status  . Former Smoker  Smokeless tobacco  . Never Used    History  Alcohol Use  . Yes    Family History  Problem Relation Age of Onset  . Hypertension Mother   . Liver cancer Brother   . Breast cancer Daughter   . Colon cancer Brother     Reviw of Systems:  Reviewed in the HPI.  All other systems are negative.  Physical Exam: BP 158/70  Pulse 74  Ht  5\' 3"  (1.6 m)  Wt 141 lb 6.4 oz (64.139 kg)  BMI 25.05 kg/m2 The patient is alert and oriented x 3.  The mood and affect are normal.  The skin is warm and dry.  Color is normal.  The HEENT exam reveals that the sclera are nonicteric.  The mucous membranes are moist.  The carotids are 2+ without bruits.  There is no thyromegaly.  There is no JVD.  The lungs are clear.  The chest wall is non tender.  The heart exam reveals a regular rate with a normal S1 and S2.  There are no murmurs, gallops, or rubs.  The PMI is not displaced.   Abdominal exam reveals good bowel sounds.  There is no guarding or rebound.  There is no hepatosplenomegaly or tenderness.  There are no masses.  Exam of the legs reveal no clubbing, cyanosis, or edema.  The legs are without rashes.  The distal pulses are intact.  Cranial nerves II - XII are intact.  Motor and sensory functions are intact.  The gait is normal.  Assessment / Plan:

## 2010-07-11 NOTE — Assessment & Plan Note (Signed)
Sydney Moore has a history of coronary artery disease and is status post coronary artery bypass grafting. She's not had any episodes of angina. She is tolerating the Ranexa quite well. We'll continue with her same medications. I'll see her again in 6 months for office visit, lipid profile, basic metabolic profile, and hepatic profile.

## 2010-07-16 ENCOUNTER — Encounter: Payer: Self-pay | Admitting: Home Health Services

## 2010-07-16 ENCOUNTER — Ambulatory Visit (INDEPENDENT_AMBULATORY_CARE_PROVIDER_SITE_OTHER): Payer: Medicare Other | Admitting: Home Health Services

## 2010-07-16 VITALS — BP 137/69 | HR 84 | Temp 97.7°F | Ht 63.0 in | Wt 139.4 lb

## 2010-07-16 DIAGNOSIS — Z0000 Encounter for general adult medical examination without abnormal findings: Secondary | ICD-10-CM

## 2010-07-16 NOTE — Patient Instructions (Addendum)
1. Continue to walk 2 times a week and do group exercises 2 times a week. 2. Check out [employer] program at [facility] or [place]. 3. Consider getting the shingles vaccine from pharmacy. 4. Bring in copy of your living will to Dr.

## 2010-07-16 NOTE — Progress Notes (Signed)
Write addendum: I have reviewed this visit and discussed with Arlys John and agree with her documentation.

## 2010-07-16 NOTE — Progress Notes (Signed)
Patient here for annual wellness visit, patient reports: Risk Factors/Conditions needing evaluation or treatment: Patient does not have any risk factors that need evaluation. Home Safety: Patient lives in 1 story home by her self.  Patient reports having smoke detectors and does not have adaptive equipment in her bathroom. Other Information: Corrective lens: Patient wears corrective lens daily and visits eye doctor annually.  Cataract surgery 2004 Dentures: Patient does not have dentures and visits dentist every 6 months. Memory: Patient denies any memory problems. Patient's Mini Mental Score (recorded in doc. flowsheet): 30  Balance/Gait: Patient does not display any gait problems.  Has minor balance deficiencies. Balance Abnormal Patient value  Sitting balance    Sit to stand    Attempts to arise    Immediate standing balance    Standing balance    Nudge    Eyes closed- Romberg    Tandem stance x Difficult for pt  Back lean    Neck Rotation    360 degree turn    Sitting down     Gait Abnormal Patient value  Initiation of gait    Step length-left    Step length-right    Step height-left    Step height-right    Step symmetry    Step continuity    Path deviation    Trunk movement    Walking stance        Annual Wellness Visit Requirements Recorded Today In  Medical, family, social history Past Medical, Family, Social History Section  Current providers Care team  Current medications Medications  Wt, BP, Ht, BMI Vital signs  Hearing assessment (welcome visit) Hearing/vision  Tobacco, alcohol, illicit drug use History  ADL Nurse Assessment  Depression Screening Nurse Assessment  Cognitive impairment Nurse Assessment  Mini Mental Status Document Flowsheet  Fall Risk Nurse Assessment  Home Safety Progress Note  End of Life Planning (welcome visit) Social Documentation  Medicare preventative services Progress Note  Risk factors/conditions needing evaluation/treatment  Progress Note  Personalized health advice Patient Instructions, goals, letter  Diet & Exercise Social Documentation  Emergency Contact Social Documentation  Seat Belts Social Documentation  Sun exposure/protection Social Documentation   Prevention Plan: Recommended pt contact pharmacy for shingles vaccine.  Recommended Medicare Prevention Screenings Women over 23 Test For Frequency Date of Last- BOLD if needed  Breast Cancer 1-2 yrs 4/12  Cervical Cancer 1-3 yrs Not indicated  Colorectal Cancer 1-10 yrs 2005  Osteoporosis once Pt reported done  Cholesterol 5 yrs 2011  Diabetes yearly 5/12  HIV yearly declined  Influenza Shot yearly 10/11  Pneumonia Shot once Pt reported one  Zostavax Shot once recommended

## 2010-07-31 ENCOUNTER — Ambulatory Visit (INDEPENDENT_AMBULATORY_CARE_PROVIDER_SITE_OTHER): Payer: Medicare Other | Admitting: *Deleted

## 2010-07-31 DIAGNOSIS — I442 Atrioventricular block, complete: Secondary | ICD-10-CM

## 2010-07-31 NOTE — Progress Notes (Signed)
Pacemaker checked in office.

## 2010-08-13 ENCOUNTER — Encounter: Payer: Self-pay | Admitting: Family Medicine

## 2010-08-21 ENCOUNTER — Encounter: Payer: Self-pay | Admitting: *Deleted

## 2010-08-30 ENCOUNTER — Encounter: Payer: Self-pay | Admitting: Family Medicine

## 2010-08-30 ENCOUNTER — Ambulatory Visit (INDEPENDENT_AMBULATORY_CARE_PROVIDER_SITE_OTHER): Payer: Medicare Other | Admitting: Family Medicine

## 2010-08-30 VITALS — BP 147/70 | HR 80 | Temp 98.0°F | Ht 62.0 in | Wt 141.0 lb

## 2010-08-30 DIAGNOSIS — R05 Cough: Secondary | ICD-10-CM

## 2010-08-30 MED ORDER — NITROGLYCERIN 0.4 MG SL SUBL: 0.4000 mg | SUBLINGUAL_TABLET | SUBLINGUAL | Status: DC | PRN

## 2010-08-30 NOTE — Assessment & Plan Note (Signed)
1. New cough over last 4 days: suspect related to post nasal drip or sinus drainage.  Not concerned for cardiac or infectious etiology.  A. Discussed cough with patient- explained likely not due to heart problems or infection; suspect it will resolve in 7-10 days.  Instructed her to call provider if it does not resolve in 7-10 days or if it gets worse, or if she develops a fever or other s/s infection.  B. Suggested pt purchase and take robitussin DM, 1 tsp q4-6hrs PRN cough.  C. Also told pt that if her chest heaviness is a concern, or increases in frequency, she should notify her cardiologist

## 2010-08-30 NOTE — Progress Notes (Signed)
Subjective:    Patient ID: Sydney Moore, female    DOB: 1923-04-12, 77 y.o.   MRN: [id number]  HPI 77 year old woman presents with a nonproductive cough for last 4 days.  Cough is intermittent, occurs at all times of day and night, at rest or during exertion.  No fevers, chills, malaise, change in appetite or intake, unintentional weight changes, or change in her baseline night sweats (not drenching night sweats).  She reports increase in her exertional dyspnea and chest heaviness that has occurred over last 2-3 days.  This dyspnea is relieved with rest, and chest heaviness is relieved with rest, but on Tuesday and Wednesday she had to take one tab of nitro- which also relieved her chest heaviness.  As of yesterday, Ms. Fonner has limited the activity that causes her this DOE and chest heaviness.  She has not noted any lower extremity edema.  Orthopnea is unchanged still sleeping on 3 pillows (last few years).  Has not taken anything to relieve her cough.   Review of Systems  Constitutional:       Per HPI  HENT: Negative for ear pain, congestion, sore throat, rhinorrhea, sneezing, mouth sores, trouble swallowing, neck stiffness, voice change, postnasal drip and sinus pressure.   Eyes: Negative for discharge, redness and itching.  Respiratory: Positive for cough and shortness of breath. Negative for choking and wheezing.        Per hpi: exertional dyspnea and occasional chest heaviness  Cardiovascular: Negative for palpitations and leg swelling.       Occasional exertional chest heaviness per HPI  Gastrointestinal: Negative for nausea and vomiting.       No dyspepsia, regurgitation.  Musculoskeletal: Negative for myalgias and arthralgias.  Skin: Negative for color change and pallor.  Neurological: Negative for dizziness, syncope, weakness, light-headedness and headaches.  Hematological: Negative for adenopathy.       Objective:   Physical Exam  Constitutional: She is oriented to person,  place, and time. She appears well-developed and well-nourished. No distress.  HENT:  Head: Normocephalic and atraumatic.  Nose: Nose normal. No mucosal edema or rhinorrhea. Right sinus exhibits no maxillary sinus tenderness and no frontal sinus tenderness. Left sinus exhibits no maxillary sinus tenderness and no frontal sinus tenderness.  Mouth/Throat: Uvula is midline, oropharynx is clear and moist and mucous membranes are normal. No oropharyngeal exudate, posterior oropharyngeal edema or posterior oropharyngeal erythema.  Eyes: Conjunctivae and lids are normal. Pupils are equal, round, and reactive to light.  Neck: Trachea normal. Neck supple. No JVD present.  Cardiovascular: Normal rate, regular rhythm, S1 normal and S2 normal.   Pulses:      Radial pulses are 2+ on the right side, and 2+ on the left side.       Posterior tibial pulses are 2+ on the right side, and 2+ on the left side.  Pulmonary/Chest: Effort normal and breath sounds normal. No accessory muscle usage. Not tachypneic. No respiratory distress. She has no decreased breath sounds. She has no wheezes. She has no rhonchi. She has no rales.  Lymphadenopathy:       Head (right side): No submental, no submandibular, no tonsillar and no occipital adenopathy present.       Head (left side): No submental, no submandibular, no tonsillar and no occipital adenopathy present.    She has no cervical adenopathy.  Neurological: She is alert and oriented to person, place, and time.  Skin: Skin is warm, dry and intact. She is not diaphoretic.  No cyanosis. Nails show no clubbing.       Large keloid scar over sternum 2/2 previous bypass surgery  Psychiatric: She has a normal mood and affect. Her speech is normal and behavior is normal.          Assessment & Plan:

## 2010-08-30 NOTE — Patient Instructions (Signed)
Your cough is probably related to nasal drainage that can stimulate our cough reflex.  We are not concerned about fluid on your lungs or an infection.  1. For your cough, pick up some Robitussin DM  (DM= dextromethorphan.  This is a cough suppressant).  Take 1 teaspoon every 4-6 hours as needed for cough.  2. If your cough doesn't get better in next 7-10 days, or gets worse, notify your doctor's office.  3. If your chest heaviness continues to get worse, call and notify your cardiologist.

## 2010-09-09 DIAGNOSIS — I442 Atrioventricular block, complete: Secondary | ICD-10-CM

## 2010-09-10 ENCOUNTER — Telehealth: Payer: Self-pay | Admitting: Cardiovascular Disease

## 2010-09-10 ENCOUNTER — Encounter: Payer: Self-pay | Admitting: Internal Medicine

## 2010-09-10 NOTE — Telephone Encounter (Signed)
Patient had questions about [employer] TTM's.  Her device is near ERI.  We will have [employer] check her in between office visits.  Patient is in agreement and aware she is to test with them today and has a ROV for Sept. Device in office check.

## 2010-09-10 NOTE — Telephone Encounter (Signed)
Pt has question re her new device

## 2010-09-11 ENCOUNTER — Inpatient Hospital Stay (HOSPITAL_COMMUNITY)
Admission: EM | Admit: 2010-09-11 | Discharge: 2010-09-12 | DRG: 303 | Disposition: A | Discharge status: Home / Self Care | Payer: Medicare Other | Attending: Cardiology | Admitting: Cardiology

## 2010-09-11 ENCOUNTER — Emergency Department (HOSPITAL_COMMUNITY): Payer: Medicare Other

## 2010-09-11 DIAGNOSIS — Z950 Presence of cardiac pacemaker: Secondary | ICD-10-CM

## 2010-09-11 DIAGNOSIS — Z9849 Cataract extraction status, unspecified eye: Secondary | ICD-10-CM

## 2010-09-11 DIAGNOSIS — E119 Type 2 diabetes mellitus without complications: Secondary | ICD-10-CM | POA: Diagnosis present

## 2010-09-11 DIAGNOSIS — Z951 Presence of aortocoronary bypass graft: Secondary | ICD-10-CM

## 2010-09-11 DIAGNOSIS — Z860100 Personal history of colon polyps, unspecified: Secondary | ICD-10-CM

## 2010-09-11 DIAGNOSIS — I2510 Atherosclerotic heart disease of native coronary artery without angina pectoris: Principal | ICD-10-CM | POA: Diagnosis present

## 2010-09-11 DIAGNOSIS — E039 Hypothyroidism, unspecified: Secondary | ICD-10-CM | POA: Diagnosis present

## 2010-09-11 DIAGNOSIS — I252 Old myocardial infarction: Secondary | ICD-10-CM

## 2010-09-11 DIAGNOSIS — R079 Chest pain, unspecified: Secondary | ICD-10-CM

## 2010-09-11 DIAGNOSIS — I672 Cerebral atherosclerosis: Secondary | ICD-10-CM | POA: Diagnosis present

## 2010-09-11 DIAGNOSIS — R0789 Other chest pain: Secondary | ICD-10-CM | POA: Diagnosis present

## 2010-09-11 DIAGNOSIS — Z79899 Other long term (current) drug therapy: Secondary | ICD-10-CM

## 2010-09-11 DIAGNOSIS — Z7982 Long term (current) use of aspirin: Secondary | ICD-10-CM

## 2010-09-11 DIAGNOSIS — Z87891 Personal history of nicotine dependence: Secondary | ICD-10-CM

## 2010-09-11 DIAGNOSIS — I10 Essential (primary) hypertension: Secondary | ICD-10-CM | POA: Diagnosis present

## 2010-09-11 DIAGNOSIS — E876 Hypokalemia: Secondary | ICD-10-CM | POA: Diagnosis present

## 2010-09-11 DIAGNOSIS — E785 Hyperlipidemia, unspecified: Secondary | ICD-10-CM | POA: Diagnosis present

## 2010-09-11 DIAGNOSIS — K219 Gastro-esophageal reflux disease without esophagitis: Secondary | ICD-10-CM | POA: Diagnosis present

## 2010-09-11 LAB — CBC
HCT: 36.7 % (ref 36.0–46.0)
Hemoglobin: 12.8 g/dL (ref 12.0–15.0)
RBC: 4.21 MIL/uL (ref 3.87–5.11)
WBC: 5.9 10*3/uL (ref 4.0–10.5)

## 2010-09-11 LAB — CK TOTAL AND CKMB (NOT AT ARMC)
CK, MB: 3.9 ng/mL (ref 0.3–4.0)
Relative Index: 1.9 (ref 0.0–2.5)
Total CK: 207 U/L — ABNORMAL HIGH (ref 7–177)

## 2010-09-11 LAB — GLUCOSE, CAPILLARY
Glucose-Capillary: 136 mg/dL — ABNORMAL HIGH (ref 70–99)
Glucose-Capillary: 125 mg/dL — ABNORMAL HIGH (ref 70–99)

## 2010-09-11 LAB — POCT I-STAT, CHEM 8
Creatinine, Ser: 0.9 mg/dL (ref 0.50–1.10)
HCT: 40 % (ref 36.0–46.0)
Hemoglobin: 13.6 g/dL (ref 12.0–15.0)
Potassium: 3.2 mEq/L — ABNORMAL LOW (ref 3.5–5.1)
Sodium: 139 mEq/L (ref 135–145)

## 2010-09-11 LAB — TSH: TSH: 3.196 u[IU]/mL (ref 0.350–4.500)

## 2010-09-11 LAB — DIFFERENTIAL
Basophils Absolute: 0 10*3/uL (ref 0.0–0.1)
Basophils Relative: 0 % (ref 0–1)
Lymphocytes Relative: 25 % (ref 12–46)
Monocytes Absolute: 0.6 10*3/uL (ref 0.1–1.0)
Neutro Abs: 3.8 10*3/uL (ref 1.7–7.7)
Neutrophils Relative %: 64 % (ref 43–77)

## 2010-09-11 LAB — CARDIAC PANEL(CRET KIN+CKTOT+MB+TROPI): Total CK: 199 U/L — ABNORMAL HIGH (ref 7–177)

## 2010-09-11 LAB — HEMOGLOBIN A1C
Hgb A1c MFr Bld: 6.3 % — ABNORMAL HIGH (ref <5.7)
Mean Plasma Glucose: 134 mg/dL — ABNORMAL HIGH (ref <117)

## 2010-09-11 LAB — TROPONIN I: Troponin I: <0.3 ng/mL (ref <0.30)

## 2010-09-12 LAB — CARDIAC PANEL(CRET KIN+CKTOT+MB+TROPI): Total CK: 232 U/L — ABNORMAL HIGH (ref 7–177)

## 2010-09-12 LAB — LIPID PANEL
Cholesterol: 126 mg/dL (ref 0–200)
Total CHOL/HDL Ratio: 2.6 RATIO
Triglycerides: 105 mg/dL (ref <150)
VLDL: 21 mg/dL (ref 0–40)

## 2010-09-16 ENCOUNTER — Ambulatory Visit: Payer: Self-pay | Admitting: Surgery

## 2010-09-16 ENCOUNTER — Other Ambulatory Visit: Payer: Self-pay

## 2010-09-18 ENCOUNTER — Ambulatory Visit (INDEPENDENT_AMBULATORY_CARE_PROVIDER_SITE_OTHER): Payer: Medicare Other | Admitting: Family Medicine

## 2010-09-18 ENCOUNTER — Encounter: Payer: Self-pay | Admitting: Family Medicine

## 2010-09-18 DIAGNOSIS — IMO0001 Reserved for inherently not codable concepts without codable children: Secondary | ICD-10-CM

## 2010-09-18 DIAGNOSIS — I10 Essential (primary) hypertension: Secondary | ICD-10-CM

## 2010-09-18 DIAGNOSIS — G4700 Insomnia, unspecified: Secondary | ICD-10-CM

## 2010-09-18 DIAGNOSIS — F419 Anxiety disorder, unspecified: Secondary | ICD-10-CM

## 2010-09-18 DIAGNOSIS — E785 Hyperlipidemia, unspecified: Secondary | ICD-10-CM

## 2010-09-18 DIAGNOSIS — F411 Generalized anxiety disorder: Secondary | ICD-10-CM

## 2010-09-18 DIAGNOSIS — I25810 Atherosclerosis of coronary artery bypass graft(s) without angina pectoris: Secondary | ICD-10-CM

## 2010-09-18 DIAGNOSIS — K219 Gastro-esophageal reflux disease without esophagitis: Secondary | ICD-10-CM

## 2010-09-18 LAB — RENAL FUNCTION PANEL
Albumin: 4.5 g/dL (ref 3.5–5.2)
BUN: 19 mg/dL (ref 6–23)
Calcium: 9.8 mg/dL (ref 8.4–10.5)
Glucose, Bld: 99 mg/dL (ref 70–99)

## 2010-09-18 MED ORDER — METOPROLOL SUCCINATE ER 25 MG PO TB24: 37.5000 mg | ORAL_TABLET | Freq: Every day | ORAL | Status: DC

## 2010-09-18 MED ORDER — MIRTAZAPINE 15 MG PO TABS: 15.0000 mg | ORAL_TABLET | Freq: Every day | ORAL | Status: DC

## 2010-09-18 NOTE — Patient Instructions (Signed)
It was good to meet you today  I am starting you on remeron for your mood and sleep I am increasing your metoprolol for your blood pressure  I am rechecking your electrolytes today Be sure to follow up with your cardiologist on 8/15  Come back to see me in 1 month.  Call with any questions, or concerns.

## 2010-09-19 ENCOUNTER — Telehealth: Payer: Self-pay | Admitting: Cardiovascular Disease

## 2010-09-19 NOTE — Telephone Encounter (Signed)
Call Back Phone#: [phone] Latest STRESS with [facility] (2007)

## 2010-09-20 NOTE — Telephone Encounter (Signed)
Faxed Lastest Stress with [facility] from 2007 today to [phone]

## 2010-09-20 NOTE — H&P (Signed)
NAMEMarland Kitchen  Sydney Moore, Sydney Moore                ACCOUNT NO.:  [id number]  MEDICAL RECORD NO.:  [id number]  LOCATION:  [facility]                         FACILITY:  [facility]  PHYSICIAN:  Sydney Frieze. Jens Som, MD, FACCDATE OF BIRTH:  [birth year]  DATE OF ADMISSION:  09/11/2010 DATE OF DISCHARGE:                             HISTORY & PHYSICAL   PRIMARY CARE PHYSICIAN:  Sydney Moore.  PRIMARY CARDIOLOGIST:  Sydney Mixer, MD  CHIEF COMPLAINT:  Chest pain.  HISTORY OF PRESENT ILLNESS:  Sydney Moore is an 77 year old female with a history of coronary artery disease.  She developed a cough more than 2 weeks ago.  She saw Sydney Moore on August 30, 2010, for the cough.  She said they told her she did not have an infection and gave her over-the-counter medications to treat it.  After that, she developed diaphoresis as well as left chest pain and left arm pain with the cough. For approximately 5 days, she would also awake in the night feeling diaphoretic and anxious and has been sleeping poorly.  When she would get the chest pain which she states she had at times without coughing, she would take sublingual nitroglycerin which helped her symptoms.  She is otherwise unable to quantify or qualify the pain except as described it as a tingling.  She carries groceries and baskets of laundry without any history of exertional symptoms.  Prior to the onset of these symptoms a few weeks ago, she rarely had chest pain and cannot remember the last episode.  Currently, she is resting comfortably.  PAST MEDICAL HISTORY: 1. Status post aortocoronary bypass surgery in 1989 with LIMA to LAD,     SVG to OM, and SVG to RCA. 2. Status post cath in May 2012 showing LAD 90% and then occluded at     the first diagonal, first diagonal 90% stenosis, ramus intermedius     90% at the origin, circumflex occluded, OM-1 occluded, and RCA mid     90-95.  The SVG to RCA was patent and the LIMA to LAD was patent,     but the SVG  to OM was occluded.  Her EF was 50%.  She had mid left     ventricular dynamic obstruction.  Percutaneous intervention was     attempted, but they were unable to cross the lesion. 3. Diabetes. 4. Hypertension. 5. Hyperlipidemia. 6. History of bradycardia, status post Medtronic dual lead pacemaker. 7. History of cerebrovascular disease. 8. Hypothyroidism. 9. Gastroesophageal reflux disease. 10.History of MI.  SURGICAL HISTORY:  She is status post cardiac catheterization as well as bypass surgery in 1989, pacemaker in 2004, colonoscopy with polypectomy, and cataract surgery.  ALLERGIES:  No known drug allergies.  CURRENT MEDICATIONS: 1. Os-Cal with D daily. 2. Toprol-XL 25 mg a day. 3. Amlodipine 10 mg a day. 4. Ranexa 500 mg b.i.d. 5. Lasix 40 mg a day. 6. Imdur 60 mg a day. 7. Plavix 75 mg a day. 8. Zocor 40 mg nightly. 9. Synthroid 50 mcg a day. 10.Potassium 20 mEq a day. 11.Metformin 500 mg 1/2 tablet daily. 12.Aspirin 81 mg a day.  SOCIAL HISTORY:  She lives in [place] alone.  She drives some.  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  She has approximately 25-pack-year history of tobacco use and quit in 1975.  She denies alcohol or drug abuse.  She does her own ADLs, but no yard work.  FAMILY HISTORY:  Both of her parents were in their 7s when they died and neither one had a cardiac history.  No siblings have coronary artery disease known.  REVIEW OF SYSTEMS:  The cough has improved and was never productive. There were no fevers or chills.  The chest pain is as described above. She had one small episode of dizziness yesterday.  She has felt that she is intolerant of cold recently.  She has occasional arthralgias and bright red blood per rectum, but is not sure when the last episode was. Full 14-point review of systems was otherwise negative except as stated in the HPI.  PHYSICAL EXAMINATION:  VITAL SIGNS:  Temperature is 97.8, blood  pressure 166/68, heart rate 78, respiratory rate 18, and O2 saturation 96% on room air. GENERAL:  She is a well-developed, elderly African American female in no acute distress. HEENT:  Normal. NECK:  There is no lymphadenopathy, thyromegaly, or JVD noted and she has a left carotid bruit and possibly one on the right. CARDIOVASCULAR:  Her heart is regular in rate and rhythm with an S1 and S2 and a soft systolic murmur is noted at the upper sternal border. Distal pulses are intact in all 4 extremities. LUNGS:  Clear to auscultation bilaterally. SKIN:  No rashes or lesions are noted. ABDOMEN:  Soft and nontender with active bowel sounds. EXTREMITIES:  There is no cyanosis, clubbing, or edema noted. MUSCULOSKELETAL:  There is no joint deformity or effusions and no spine or CVA tenderness. NEURO:  She is alert and oriented with cranial nerves II-XII grossly intact.  Chest x-ray, no acute disease.  EKG is sinus rhythm with ventricular pacing, rate 70.  LABORATORY VALUES:  Hemoglobin 12.8, hematocrit 36.7, WBCs 5.9, and platelets 257.  Sodium 139, potassium 3.2, chloride 99, BUN 15, creatinine 0.9, and glucose 138.  CK-MB 207/3.9 with a troponin I of less than 0.30.  IMPRESSION:  Sydney Moore was seen today by Sydney Moore, the patient evaluated and the data reviewed.  She is an 77 year old female with a past medical history of coronary artery disease status post coronary artery bypass grafting, hypertension, diabetes, and hyperlipidemia.  She also has a pacemaker and cerebrovascular disease, reflux disease and hypothyroidism whom we are asked to evaluate for chest pain.  She states over the past 1-1/2 weeks she has had intermittent chest pain which is described as a "tingling" in the left upper chest with radiation to the neck and left arm.  It increased with cough.  It was not exertional or related to food.  She complains of a mildly productive cough.  Her EKG is sinus rhythm with V  pacing and initial enzymes were negative with a negative chest x-rays as well.  PLAN:  We will admit to telemetry and rule out MI.  If her cardiac enzymes remain negative, medical therapy will be continued with a repeat Myoview per Dr. Elease Hashimoto.  If she has significant ischemia, we could consider a repeat catheterization evaluating for a lesion amenable to percutaneous intervention.  In the meantime, she will be continued on her current medications with DVT Lovenox.     Theodore Demark, PA-C   ______________________________ Sydney Frieze. Jens Som, MD, [facility]    RB/MEDQ  D:  09/11/2010  T:  09/11/2010  Job:  [id number]  Electronically Signed by Theodore Demark PA-C on 09/19/2010 01:59:34 PM Electronically Signed by Olga Millers MD [place] on 09/20/2010 08:40:48 AM

## 2010-09-22 MED ORDER — ESOMEPRAZOLE MAGNESIUM 40 MG PO CPDR: 40.0000 mg | DELAYED_RELEASE_CAPSULE | Freq: Every day | ORAL | Status: DC

## 2010-09-22 NOTE — Progress Notes (Signed)
  Subjective:    Patient ID: Sydney Moore, female    DOB: August 17, 1923, 77 y.o.   MRN: [id number]  HPI 67 YOF w/ PMHx/o HTN, HLD, MI s/p CABG and cardiac catheterization,  DM, CVA with recent admission 09/11/10-09/12/10 for chest pain with diagnosis of non cardiac chest pain.  Pt was also placed on ppi at discharge. Pt with pending cardiac myoview w/ Dr. Elease Hashimoto 09/22/10.  Pt states that she has been CP free since hospital d/c.  Pt does night- time nervousness with episodes of diaphoresis and generalized nervousness. Pt has had some insomnia associated with these episodes. These episodes over last 3-6 months. Most prominently over last month.  Pt states that some of these sxs were the impetus for admission 09/11/10. Pt also reports mild decreased appetite and depressed mood. Pt denies any SI/HI. Pt denied colonoscopy this year (due 2012).    Review of Systems See HPI    Objective:   Physical Exam Gen: up in chair, NAD CV: RRR, no murmurs auscultated PULM: CTAB, no wheezes, rales, rhoncii ABD: S/NT/+ bowel sounds  EXT: 2+ peripheral pulses    Assessment & Plan:

## 2010-09-22 NOTE — Assessment & Plan Note (Signed)
LDL at goal from most recent admission.

## 2010-09-22 NOTE — Assessment & Plan Note (Signed)
Pending cardiac myoview 09/22/10.

## 2010-09-22 NOTE — Assessment & Plan Note (Signed)
Overall sxs consistent with mild depression. PHQ-9 score 9. Will start on remeron. Weight has been generally stable. Pt may benefit from colonscopy to rule out malignancy as source of sxs. Will follow up in 2 weeks to reassess sxs.

## 2010-09-22 NOTE — Assessment & Plan Note (Signed)
Placed on ppi at most recent discharge.

## 2010-09-23 ENCOUNTER — Ambulatory Visit (HOSPITAL_COMMUNITY): Payer: Medicare Other | Attending: Cardiovascular Disease | Admitting: Radiology

## 2010-09-23 DIAGNOSIS — R079 Chest pain, unspecified: Secondary | ICD-10-CM

## 2010-09-23 DIAGNOSIS — R9431 Abnormal electrocardiogram [ECG] [EKG]: Secondary | ICD-10-CM

## 2010-09-23 DIAGNOSIS — I4949 Other premature depolarization: Secondary | ICD-10-CM

## 2010-09-23 MED ORDER — TECHNETIUM TC 99M TETROFOSMIN IV KIT
33.0000 | PACK | Freq: Once | INTRAVENOUS | Status: AC | PRN
  Administered 2010-09-23: 33 via INTRAVENOUS

## 2010-09-23 MED ORDER — TECHNETIUM TC 99M TETROFOSMIN IV KIT
11.0000 | PACK | Freq: Once | INTRAVENOUS | Status: AC | PRN
  Administered 2010-09-23: 11 via INTRAVENOUS

## 2010-09-23 MED ORDER — ADENOSINE (DIAGNOSTIC) 3 MG/ML IV SOLN
0.5600 mg/kg | Freq: Once | INTRAVENOUS | Status: AC
  Administered 2010-09-23: 33.9 mg via INTRAVENOUS

## 2010-09-23 NOTE — Progress Notes (Addendum)
[facility] SITE 3 NUCLEAR MED [address] [place] [address] [phone]  Cardiology Nuclear Med Study  Sydney Moore is a 77 y.o. female [id number] October 13, 1923   Nuclear Med Background Indication for Stress Test:  Evaluation for Ischemia, Graft Patency and 09/12/10 Post Hospital: CP, (-) enzymes History: '89 CABG: x3, 06/09 Heart Catheterization: 2 patent grafts EF 50 % the rest Rx Tx, '89 Myocardial Infarction, '07 Myocardial Perfusion Study: EF 35%  and Pacemaker Cardiac Risk Factors: Carotid Disease, History of Smoking, Hypertension, Lipids, NIDDM and PVD  Symptoms:  Chest Pain, Palpitations and SOB   Nuclear Pre-Procedure Caffeine/Decaff Intake:  None NPO After: 7:00pm   Lungs:  clear IV 0.9% NS with Angio Cath:  20g  IV Site: R Antecubital  IV Started by:  Stanton Kidney, EMT-P  Chest Size (in):  36 Cup Size: B  Height: 5\' 2"  (1.575 m)  Weight:  134 lb (60.782 kg)  BMI:  Body mass index is 24.51 kg/(m^2). Tech Comments:  Meds held this am, per patient. CBG=107 @ 8am, per patient.    Nuclear Med Study 1 or 2 day study: 1 day  Stress Test Type:  Adenosine  Reading MD: Sydney Millers, MD  Order Authorizing Provider:  P.Nahser  Resting Radionuclide: Technetium 24m Tetrofosmin  Resting Radionuclide Dose: 11 mCi   Stress Radionuclide:  Technetium 27m Tetrofosmin  Stress Radionuclide Dose: 33 mCi           Stress Protocol Rest HR: 63 Stress HR: 71  Rest BP: 152/63 Stress BP: 149/49  Exercise Time (min): n/a METS: n/a   Predicted Max HR: 136 bpm % Max HR: 52.21 bpm Rate Pressure Product: 86578   Dose of Adenosine (mg):  34.1 Dose of Lexiscan: n/a mg  Dose of Atropine (mg): n/a Dose of Dobutamine: n/a mcg/kg/min (at max HR)  Stress Test Technologist: Milana Na, EMT-P  Nuclear Technologist:  Domenic Polite, CNMT     Rest Procedure:  Myocardial perfusion imaging was performed at rest 45 minutes following the intravenous administration of  Technetium 8m Tetrofosmin. Rest ECG: NSR  Stress Procedure:  The patient received IV adenosine at 140 mcg/kg/min for 4 minutes.  There were no significant changes and occ pvcs with infusion.  Technetium 85m Tetrofosmin was injected at the 2 minute mark and quantitative spect images were obtained after a 45 minute delay. Stress ECG: Uninteretable due to baseline LBBB  QPS Raw Data Images:  Acquisition technically good. Stress Images:  There is decreased uptake in the distal anterior wall and inferoseptal wall. Rest Images:  There is decreased uptake in the distal anterior wall and inferoseptal wall. Subtraction (SDS):  No evidence of ischemia. Transient Ischemic Dilatation (Normal <1.22):  1.21 Lung/Heart Ratio (Normal <0.45):  .23  Quantitative Gated Spect Images QGS EDV:  92 ml QGS ESV:  62 ml QGS cine images:  Distal septal and apical akinesis; mild LVE. QGS EF: 33%  Impression Exercise Capacity:  Adenosine study with no exercise. BP Response:  Normal blood pressure response. Clinical Symptoms:  No chest pain. ECG Impression:  Baseline:  LBBB.  EKG uninterpretable due to LBBB at rest and stress. Comparison with Prior Nuclear Study: No images to compare  Overall Impression:  Abnormal stress nuclear study with prior distal anterior infarct; no ischemia.      Sydney Moore   09/25/10 Shows an old anterior MI ( known) and no ischemia.  Continue current meds.  Vesta Mixer, Montez Hageman., MD, [facility]

## 2010-09-24 NOTE — Progress Notes (Signed)
nuc med report touted to Dr. Elease Hashimoto 09/24/10 Sydney Moore

## 2010-09-25 NOTE — Discharge Summary (Signed)
NAMEMarland Kitchen  Sydney Moore, Sydney Moore NO.:  [id number]  MEDICAL RECORD NO.:  [id number]  LOCATION:  [id number]                         FACILITY:  [facility]  PHYSICIAN:  Vesta Mixer, M.D. DATE OF BIRTH:  [birth year]  DATE OF ADMISSION:  09/11/2010 DATE OF DISCHARGE:  09/12/2010                              DISCHARGE SUMMARY   PROCEDURES:  Portable chest x-ray.  PRIMARY FINAL DISCHARGE DIAGNOSES:  Chest pain, cardiac enzymes negative for myocardial infarction and outpatient Myoview planned.  SECONDARY DIAGNOSES: 1. Hypokalemia. 2. Status post aortocoronary bypass surgery in 1989. 3. Status post cardiac catheterization last in 2009, showing LAD 90%     and then totalled, diagonal patent, ramus intermedius 90,     circumflex totalled, OM totalled, RCA 90%, LIMA to LAD patent, SVG     to RCA patent and SVG to OM totalled, unsuccessful PCI. 4. Hypertension. 5. Diabetes with a hemoglobin A1c of 6.3 this admission. 6. Hyperlipidemia with total cholesterol 126, triglycerides 105, HDL     49, LDL 56 this admission. 7. Cerebrovascular disease. 8. Hypothyroidism with a TSH of 3.196 this admission. 9. Gastroesophageal reflux disease. 10.History of symptomatic bradycardia and complete heart block, status     post Medtronic dual lead permanent pacemaker.  TIME OF DISCHARGE:  36 minutes.  HOSPITAL COURSE:  Sydney Moore is an 77 year old female with a history of coronary artery disease.  She had a cough and it was initially thought to be an upper respiratory infection, but she developed chest pain and came to the hospital, where she was admitted for further evaluation.  Her chest x-ray showed no acute disease.  She was not febrile and her white count was within normal limits.  Blood sugars were monitored during her hospital stay.  She was hypokalemic and this was supplemented.  Her CKs were slightly elevated between 199 and 232, but MB's and troponin's were all negative.  Her potassium was  supplemented and her home dose was increased.  On September 12, 2010, Sydney Moore was seen by Dr. Elease Hashimoto.  She was ambulating without chest pain or shortness of breath.  She had a proton-pump inhibitor added to her medication regimen and is to get an outpatient stress test.  Dr. Elease Hashimoto considers her stable for discharge, to follow up as an outpatient.  DISCHARGE INSTRUCTIONS:  Her activity level is to be increased gradually.  She is encouraged to stick to a low-sodium diabetic diet. She is to follow up with a stress test and then see Dr. Elease Hashimoto as an outpatient and this will be arranged prior to discharge.  She is to get a BMET when she comes back to the office.  DISCHARGE MEDICATIONS: 1. Toprol-XL 25 mg a day. 2. Metformin 500 mg one half tab daily. 3. Ranexa 500 mg b.i.d. 4. Amlodipine 10 mg a day. 5. Lipitor 20 mg a day. 6. Zocor 40 mg daily is discontinued because of the Norvasc. 7. Lasix 40 mg a day. 8. Imdur 60 mg a day. 9. Sublingual nitroglycerin p.r.n. 10.Aspirin 81 mg a day. 11.Plavix 75 mg a day. 12.Potassium 20 mEq b.i.d. 13.Synthroid 50 mcg daily 14.Os-Cal plus D daily.     Theodore Demark,  PA-C   ______________________________ Vesta Mixer, M.D.    RB/MEDQ  D:  09/12/2010  T:  09/12/2010  Job:  [id number]  cc:   [facility]  Electronically Signed by Theodore Demark PA-C on 09/19/2010 01:55:50 PM Electronically Signed by Kristeen Miss M.D. on 09/25/2010 05:35:55 PM

## 2010-09-26 ENCOUNTER — Telehealth: Payer: Self-pay | Admitting: *Deleted

## 2010-09-26 NOTE — Telephone Encounter (Signed)
No answer, left msg to call back for results.Alfonso Ramus RN

## 2010-09-26 NOTE — Telephone Encounter (Signed)
Results given, confirmed f/u app. Pt verbalized understanding. Alfonso Ramus RN

## 2010-09-30 ENCOUNTER — Encounter: Payer: Self-pay | Admitting: Surgery

## 2010-10-08 ENCOUNTER — Encounter: Payer: Self-pay | Admitting: Internal Medicine

## 2010-10-08 DIAGNOSIS — I442 Atrioventricular block, complete: Secondary | ICD-10-CM

## 2010-10-25 ENCOUNTER — Encounter: Payer: Self-pay | Admitting: Cardiovascular Disease

## 2010-10-25 ENCOUNTER — Ambulatory Visit (INDEPENDENT_AMBULATORY_CARE_PROVIDER_SITE_OTHER): Payer: Medicare Other | Admitting: Cardiovascular Disease

## 2010-10-25 DIAGNOSIS — R0989 Other specified symptoms and signs involving the circulatory and respiratory systems: Secondary | ICD-10-CM

## 2010-10-25 DIAGNOSIS — I25810 Atherosclerosis of coronary artery bypass graft(s) without angina pectoris: Secondary | ICD-10-CM

## 2010-10-25 DIAGNOSIS — I739 Peripheral vascular disease, unspecified: Secondary | ICD-10-CM

## 2010-10-25 NOTE — Assessment & Plan Note (Signed)
She presents with symptoms of claudication. Her distal pulses are somewhat diminished. We will send her for an arterial duplex of both legs.

## 2010-10-25 NOTE — Assessment & Plan Note (Signed)
Stable

## 2010-10-25 NOTE — Patient Instructions (Signed)
See Dr. Rene Paci for primary medical issues.

## 2010-10-25 NOTE — Progress Notes (Signed)
Sydney Moore Date of Birth  1923/11/09 [facility] / [facility] [address]     [address] [place], [place]  [address] [phone]  Fax  [phone]  History of Present Illness:  77 year old female with a history of coronary artery disease and pacemaker implant.  She has a history of peripheral vascular disease and has a known left carotid bruit which we are following.   She's had a history of coronary artery bypass grafting. She denies any shortness of breath or chest pain. She does complain of some leg weakness particularly with walking.  She's not having episodes of syncope. She's now getting her pacemaker checked monthly.    Current Outpatient Prescriptions on File Prior to Visit  Medication Sig Dispense Refill  . amLODipine (NORVASC) 10 MG tablet Take 10 mg by mouth daily.        Marland Kitchen aspirin 81 MG tablet Take 81 mg by mouth every morning.        Marland Kitchen atorvastatin (LIPITOR) 20 MG tablet 40 mg.       . calcium-vitamin D (OSCAL WITH D) 500-200 MG-UNIT per tablet Take 1 tablet by mouth daily.        . clopidogrel (PLAVIX) 75 MG tablet Take 75 mg by mouth daily. Dr. Melburn Popper prescribes       . esomeprazole (NEXIUM) 40 MG capsule Take 1 capsule (40 mg total) by mouth daily.  30 capsule  1  . furosemide (LASIX) 40 MG tablet Take 40 mg by mouth daily. for swelling       . isosorbide mononitrate (IMDUR) 60 MG 24 hr tablet 60 mg daily.       Marland Kitchen levothyroxine (SYNTHROID, LEVOTHROID) 50 MCG tablet Take 50 mcg by mouth daily.        . metFORMIN (GLUCOPHAGE) 500 MG tablet Take 1/2  tablet by mouth once a day.       . metoprolol succinate (TOPROL-XL) 25 MG 24 hr tablet Take 1.5 tablets (37.5 mg total) by mouth daily.  60 tablet  1  . nitroGLYCERIN (NITROSTAT) 0.4 MG SL tablet Place 1 tablet (0.4 mg total) under the tongue every 5 (five) minutes as needed for chest pain.  90 tablet  3  . potassium chloride SA (K-DUR,KLOR-CON) 20 MEQ tablet Take 20 mEq by mouth 2 (two) times  daily.       . ranolazine (RANEXA) 500 MG 12 hr tablet Take 500 mg by mouth 2 (two) times daily. Dr. Melburn Popper prescribes       . mirtazapine (REMERON) 15 MG tablet Take 1 tablet (15 mg total) by mouth at bedtime.  30 tablet  2    No Known Allergies  Past Medical History  Diagnosis Date  . Hypertension   . Hyperlipidemia   . MI (myocardial infarction)     Triple Bypass 1989  . Diabetes mellitus   . Chronic cough     Seen by Dr. Sherene Sires  . Carotid bruit     Left, Monitor seen by [facility]  . Thyroid disease     Hypothyroidism  . CAD (coronary artery disease)   . Complete heart block     s/p PPM (MDT) by Dr Deborah Chalk 2004  . Carotid artery occlusion 2011    60-80%right/ 40-60 left    Past Surgical History  Procedure Date  . Coronary artery bypass graft 1989  . Pacemaker placement 2004    MDT by Dr Deborah Chalk  . Cataract extraction 2004    Both  History  Smoking status  . Former Smoker  . Quit date: 12/15/1973  Smokeless tobacco  . Never Used    History  Alcohol Use No    Family History  Problem Relation Age of Onset  . Hypertension Mother   . Liver cancer Brother   . Breast cancer Daughter   . Colon cancer Brother   . Cancer Brother     colon    Reviw of Systems:  Reviewed in the HPI.  All other systems are negative.  Physical Exam: BP 144/82  Pulse 64  Ht 5\' 2"  (1.575 m)  Wt 139 lb 12.8 oz (63.413 kg)  BMI 25.57 kg/m2 The patient is alert and oriented x 3.  The mood and affect are normal.   Skin: warm and dry.  Color is normal.    HEENT:   the sclera are nonicteric.  The mucous membranes are moist.  The carotids are 2+ with a left carotid bruit.  There is no thyromegaly.  There is no JVD.    Lungs: clear.  The chest wall is non tender.    Heart: regular rate with a normal S1 and S2.  There are no murmurs, gallops, or rubs. The PMI is not displaced.     Abdomen: good bowel sounds.  There is no guarding or rebound.  There is no hepatosplenomegaly or  tenderness.  There are no masses.   Extremities:  no clubbing, cyanosis, or edema.  The legs are without rashes.  The distal pulses are diminished  Neuro:  Cranial nerves II - XII are intact.  Motor and sensory functions are intact.    The gait is normal.  Assessment / Plan:

## 2010-10-25 NOTE — Assessment & Plan Note (Signed)
She remains very stable. She has not had any episodes of chest pain or shortness of breath.

## 2010-10-28 ENCOUNTER — Other Ambulatory Visit: Payer: Self-pay

## 2010-10-28 ENCOUNTER — Ambulatory Visit: Payer: Self-pay | Admitting: Surgery

## 2010-10-31 ENCOUNTER — Encounter: Payer: Self-pay | Admitting: *Deleted

## 2010-10-31 ENCOUNTER — Ambulatory Visit (INDEPENDENT_AMBULATORY_CARE_PROVIDER_SITE_OTHER): Payer: Medicare Other | Admitting: *Deleted

## 2010-10-31 ENCOUNTER — Encounter: Payer: Self-pay | Admitting: Internal Medicine

## 2010-10-31 DIAGNOSIS — I442 Atrioventricular block, complete: Secondary | ICD-10-CM

## 2010-10-31 LAB — PACEMAKER DEVICE OBSERVATION
BATTERY VOLTAGE: 2.61 V
BRDY-0002RV: 65 {beats}/min
RV LEAD IMPEDENCE PM: 672 Ohm
RV LEAD THRESHOLD: 1 V

## 2010-10-31 NOTE — Progress Notes (Signed)
PPM check 

## 2010-11-04 ENCOUNTER — Encounter: Payer: Self-pay | Admitting: Internal Medicine

## 2010-11-04 ENCOUNTER — Encounter: Payer: Self-pay | Admitting: *Deleted

## 2010-11-04 ENCOUNTER — Ambulatory Visit (INDEPENDENT_AMBULATORY_CARE_PROVIDER_SITE_OTHER): Payer: Medicare Other | Admitting: Internal Medicine

## 2010-11-04 DIAGNOSIS — I25810 Atherosclerosis of coronary artery bypass graft(s) without angina pectoris: Secondary | ICD-10-CM

## 2010-11-04 DIAGNOSIS — I442 Atrioventricular block, complete: Secondary | ICD-10-CM

## 2010-11-04 DIAGNOSIS — I10 Essential (primary) hypertension: Secondary | ICD-10-CM

## 2010-11-04 NOTE — Assessment & Plan Note (Signed)
Above goal No changes today We will reassess after her PPM generator change.

## 2010-11-04 NOTE — Progress Notes (Signed)
The patient presents today to establish care in the EP clinic.  Sydney Moore presented 2004 with complete heart block and had a pacemaker implanted by Dr Deborah Chalk at that time.  Sydney Moore reports doing well since that time.   Sydney Moore remains quite active despite her age.  Sydney Moore has significant CAD but denies exertional symptoms. Today, Sydney Moore denies symptoms of palpitations, chest pain, shortness of breath, orthopnea, PND, lower extremity edema, dizziness, presyncope, syncope, or neurologic sequela.  The patient feels that Sydney Moore is tolerating medications without difficulties and is otherwise without complaint today.   Sydney Moore has now reached ERI battery status.  Past Medical History  Diagnosis Date  . Hypertension   . Hyperlipidemia   . MI (myocardial infarction)     Triple Bypass 1989  . Diabetes mellitus   . Chronic cough     Seen by Dr. Sherene Sires  . Carotid bruit     Left, Monitor seen by [facility]  . Thyroid disease     Hypothyroidism  . CAD (coronary artery disease)   . Complete heart block     s/p PPM (MDT) by Dr Deborah Chalk 2004  . Carotid artery occlusion 2011    60-80%right/ 40-60 left   Past Surgical History  Procedure Date  . Coronary artery bypass graft 1989  . Pacemaker placement 2004    MDT by Dr Deborah Chalk  . Cataract extraction 2004    Both    Current outpatient prescriptions:amLODipine (NORVASC) 10 MG tablet, Take 10 mg by mouth daily.  , Disp: , Rfl: ;  aspirin 81 MG tablet, Take 81 mg by mouth every morning.  , Disp: , Rfl: ;  atorvastatin (LIPITOR) 20 MG tablet, Take 20 mg by mouth daily.  , Disp: , Rfl: ;  calcium-vitamin D (OSCAL WITH D) 500-200 MG-UNIT per tablet, Take 1 tablet by mouth daily.  , Disp: , Rfl:  clopidogrel (PLAVIX) 75 MG tablet, Take 75 mg by mouth daily. Dr. Melburn Popper prescribes, Disp: , Rfl: ;  esomeprazole (NEXIUM) 40 MG capsule, Take 1 capsule (40 mg total) by mouth daily., Disp: 30 capsule, Rfl: 1;  furosemide (LASIX) 40 MG tablet, Take 40 mg by mouth daily. for swelling , Disp: , Rfl: ;   isosorbide mononitrate (IMDUR) 60 MG 24 hr tablet, Take 60 mg by mouth daily. , Disp: , Rfl:  levothyroxine (SYNTHROID, LEVOTHROID) 50 MCG tablet, Take 50 mcg by mouth daily.  , Disp: , Rfl: ;  metFORMIN (GLUCOPHAGE) 500 MG tablet, Take 1/2  tablet by mouth once a day. , Disp: , Rfl: ;  metoprolol succinate (TOPROL-XL) 25 MG 24 hr tablet, Take 1.5 tablets (37.5 mg total) by mouth daily., Disp: 60 tablet, Rfl: 1 nitroGLYCERIN (NITROSTAT) 0.4 MG SL tablet, Place 1 tablet (0.4 mg total) under the tongue every 5 (five) minutes as needed for chest pain., Disp: 90 tablet, Rfl: 3;  potassium chloride SA (K-DUR,KLOR-CON) 20 MEQ tablet, Take 20 mEq by mouth 2 (two) times daily. , Disp: , Rfl: ;  ranolazine (RANEXA) 500 MG 12 hr tablet, Take 500 mg by mouth 2 (two) times daily. Dr. Melburn Popper prescribes , Disp: , Rfl:   No Known Allergies  History   Social History  . Marital Status: Widowed    Spouse Name: N/A    Number of Children: 2  . Years of Education: N/A   Occupational History  . 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 Smoking status: Former Smoker    Quit date: 12/15/1973  .  Smokeless tobacco: Never Used  . Alcohol Use: No  . Drug Use: No  . Sexually Active: No   Other Topics Concern  . Not on file   Social History Narrative   Health Care POA: son, Marcial Pacas JonesEmergency Contact: son, Jeanise Durfey [phone] of Life Plan: Who lives with you: SelfAny pets: noneDiet: Patient has a varied diet of protein, starch, vegetables, and fruitExercise: Patient does group exercises 2x week and walks 2x week.Seatbelts: Patient reports wearing seatbelt when in vehicle. Sun Exposure/Protection: Patient reports not using sun protection.Hobbies: Church, watching tv, walking    Family History  Problem Relation Age of Onset  . Hypertension Mother   . Liver cancer Brother   . Breast cancer Daughter   . Colon cancer Brother   . Cancer Brother     colon    ROS-  All  systems are reviewed and are negative except as outlined in the HPI above   Physical Exam: Filed Vitals:   11/04/10 1553  BP: 152/70  Pulse: 65  Height: 5\' 4"  (1.626 m)  Weight: 140 lb (63.504 kg)    GEN- The patient is well appearing, alert and oriented x 3 today.   Head- normocephalic, atraumatic Eyes-  Sclera clear, conjunctiva pink Ears- hearing intact Oropharynx- clear Neck- supple, no JVP Lymph- no cervical lymphadenopathy Lungs- Clear to ausculation bilaterally, normal work of breathing Chest- R sided pacemaker pocket is well healed Heart- Regular rate and rhythm,  2/6 SEM LSB Extremities- no clubbing, cyanosis, or edema MS- no significant deformity or atrophy Skin- no rash or lesion Psych- euthymic mood, full affect Neuro- strength and sensation are intact

## 2010-11-04 NOTE — Patient Instructions (Signed)
Your physician has recommended that you have a pacemaker generator change

## 2010-11-04 NOTE — Assessment & Plan Note (Signed)
Stable No change required today  

## 2010-11-04 NOTE — Assessment & Plan Note (Signed)
The patient has reached ERI battery status.  Risks, benefits, and alternatives to PPM pulse generator replacement were discussed at length today.  The patient accepts these risks and wishes to proceed. We will therefore proceed with PPM pulse generator replacement at the next available time.  She will hold plavix for 5 days prior to the procedure.

## 2010-11-05 LAB — CBC WITH DIFFERENTIAL/PLATELET
Basophils Absolute: 0 10*3/uL (ref 0.0–0.1)
HCT: 35.8 % — ABNORMAL LOW (ref 36.0–46.0)
Hemoglobin: 11.9 g/dL — ABNORMAL LOW (ref 12.0–15.0)
Lymphs Abs: 1.3 10*3/uL (ref 0.7–4.0)
Monocytes Relative: 10.1 % (ref 3.0–12.0)
Neutro Abs: 2.3 10*3/uL (ref 1.4–7.7)
RDW: 15.5 % — ABNORMAL HIGH (ref 11.5–14.6)

## 2010-11-05 LAB — BASIC METABOLIC PANEL
CO2: 28 mEq/L (ref 19–32)
GFR: 64.76 mL/min (ref >60.00)
Glucose, Bld: 121 mg/dL — ABNORMAL HIGH (ref 70–99)
Potassium: 3.5 mEq/L (ref 3.5–5.1)
Sodium: 139 mEq/L (ref 135–145)

## 2010-11-07 LAB — CBC
MCHC: 34.2
MCV: 86.9
Platelets: 242
RDW: 15.6 — ABNORMAL HIGH
WBC: 5.3

## 2010-11-07 LAB — BASIC METABOLIC PANEL
BUN: 12
CO2: 27
Chloride: 110
Creatinine, Ser: 0.88
Glucose, Bld: 103 — ABNORMAL HIGH

## 2010-11-11 LAB — GLUCOSE, CAPILLARY: Glucose-Capillary: 130 — ABNORMAL HIGH

## 2010-11-12 ENCOUNTER — Ambulatory Visit (HOSPITAL_COMMUNITY)
Admission: RE | Admit: 2010-11-12 | Discharge: 2010-11-12 | Disposition: A | Discharge status: Home / Self Care | Payer: Medicare Other | Source: Ambulatory Visit | Attending: Internal Medicine | Admitting: Internal Medicine

## 2010-11-12 DIAGNOSIS — Z45018 Encounter for adjustment and management of other part of cardiac pacemaker: Secondary | ICD-10-CM | POA: Insufficient documentation

## 2010-11-12 DIAGNOSIS — I442 Atrioventricular block, complete: Secondary | ICD-10-CM

## 2010-11-12 LAB — SURGICAL PCR SCREEN: Staphylococcus aureus: NEGATIVE

## 2010-11-12 LAB — APTT: aPTT: 28 s (ref 24–37)

## 2010-11-12 LAB — GLUCOSE, CAPILLARY: Glucose-Capillary: 112 mg/dL — ABNORMAL HIGH (ref 70–99)

## 2010-11-14 NOTE — Op Note (Signed)
NAME:  Sydney Moore, Sydney Moore NO.:  [id number]  MEDICAL RECORD NO.:  [id number]  LOCATION:  [facility]                         FACILITY:  [facility]  PHYSICIAN:  Hillis Range, MD       DATE OF BIRTH:  29-Jul-1923  DATE OF PROCEDURE: DATE OF DISCHARGE:  11/12/2010                              OPERATIVE REPORT   SURGEON:  Hillis Range, MD  PREPROCEDURE DIAGNOSES: 1. Complete heart block. 2. Pacemaker at elective replacement indicator.  POSTPROCEDURE DIAGNOSES: 1. Complete heart block. 2. Pacemaker at elective replacement indicator.  PROCEDURES:  Pacemaker pulse generator replacement with skin pocket revision.  INTRODUCTION:  Sydney Moore is a very pleasant 77 year old female with a history of complete heart block who now presents for pacemaker pulse generator replacement.  She initially underwent pacemaker implantation by Dr. Deborah Chalk in 2004.  She has done very well since that time.  She has recently reached ERI battery status and therefore presents today for pacemaker pulse generator replacement.  DESCRIPTION OF PROCEDURE:  Informed written consent was obtained and the patient was brought to the electrophysiology lab in the fasting state. She required no sedation for the procedure today.  Her pacemaker was interrogated and confirmed to have reached ERI battery status.  She had converted to a VVI backup pacing mode.  Her right chest was prepped and draped in the usual sterile fashion by the EP lab staff.  The skin overlying her existing pacemaker was infiltrated with lidocaine for local analgesia.  A 4-cm incision was made over her existing pacemaker pocket.  Using a combination of blunt and sharp dissection, her pacemaker was exposed and removed from the pocket.  A single silk suture was identified and removed.  The pacemaker was disconnected from the leads and removed from the body.  The atrial lead was confirmed to be a Insurance claims handler - 45 lead (serial number  [id number]) implanted August 26, 2002.  Her right ventricular lead was confirmed to be a Tax adviser [phone] (serial number [id number]) lead also implanted on August 26, 2002.  Both leads were examined thoroughly and their integrity was confirmed to be intact.  Atrial lead P-waves measured 2.2 mV with impedance of 436 ohms and a threshold of 0.8 volts at 0.5 milliseconds. Right ventricular lead impedance was 565 ohms with a threshold of 1 volt at 0.5 milliseconds.  There was no underlying R-wave today.  Both leads were connected to a Medtronic Adapta L model ADDRL1 (serial number [id number]) pacemaker.  The pocket was then revised to accommodate this new device which was larger than the previously implanted device. Electrocautery was required to assure hemostasis.  The pocket was then irrigated with copious gentamicin solution.  The pacemaker was then placed into the pocket.  The pocket was then closed in two layers with 2.0 Vicryl suture for the subcutaneous and subcuticular layers.  Steri- Strips and a sterile dressing were then applied.  There were no early apparent complications.  CONCLUSIONS: 1. Successful pacemaker pulse generator replacement for ERI battery     status. 2. No early apparent complications.     Hillis Range, MD     JA/MEDQ  D:  11/12/2010  T:  11/13/2010  Job:  [id number]  cc:   Vikki Ports A. Felicity Coyer, MD  Electronically Signed by Hillis Range MD on 11/14/2010 08:46:50 AM

## 2010-11-18 ENCOUNTER — Encounter (INDEPENDENT_AMBULATORY_CARE_PROVIDER_SITE_OTHER): Payer: Medicare Other | Admitting: Cardiology

## 2010-11-18 DIAGNOSIS — I739 Peripheral vascular disease, unspecified: Secondary | ICD-10-CM

## 2010-11-18 DIAGNOSIS — I70219 Atherosclerosis of native arteries of extremities with intermittent claudication, unspecified extremity: Secondary | ICD-10-CM

## 2010-11-21 ENCOUNTER — Encounter: Payer: Self-pay | Admitting: Internal Medicine

## 2010-11-21 ENCOUNTER — Ambulatory Visit (INDEPENDENT_AMBULATORY_CARE_PROVIDER_SITE_OTHER): Payer: Medicare Other | Admitting: *Deleted

## 2010-11-21 DIAGNOSIS — I442 Atrioventricular block, complete: Secondary | ICD-10-CM

## 2010-11-21 LAB — PACEMAKER DEVICE OBSERVATION
AL AMPLITUDE: 2.8 mv
AL THRESHOLD: 1 V
BAMS-0001: 150 {beats}/min
RV LEAD IMPEDENCE PM: 678 Ohm

## 2010-11-21 NOTE — Progress Notes (Signed)
Pacer checked in clinic. 

## 2010-11-29 ENCOUNTER — Ambulatory Visit (INDEPENDENT_AMBULATORY_CARE_PROVIDER_SITE_OTHER): Payer: Medicare Other | Admitting: *Deleted

## 2010-11-29 DIAGNOSIS — Z23 Encounter for immunization: Secondary | ICD-10-CM

## 2010-12-02 ENCOUNTER — Ambulatory Visit (INDEPENDENT_AMBULATORY_CARE_PROVIDER_SITE_OTHER): Payer: Medicare Other | Admitting: *Deleted

## 2010-12-02 ENCOUNTER — Ambulatory Visit: Payer: Self-pay | Admitting: Surgery

## 2010-12-02 DIAGNOSIS — I6529 Occlusion and stenosis of unspecified carotid artery: Secondary | ICD-10-CM

## 2010-12-20 ENCOUNTER — Encounter: Payer: Self-pay | Admitting: Cardiovascular Disease

## 2010-12-20 ENCOUNTER — Encounter: Payer: Self-pay | Admitting: Psychiatry

## 2010-12-20 ENCOUNTER — Ambulatory Visit (INDEPENDENT_AMBULATORY_CARE_PROVIDER_SITE_OTHER): Payer: Medicare Other | Admitting: Cardiovascular Disease

## 2010-12-20 VITALS — BP 144/62 | HR 70 | Ht 63.0 in | Wt 137.0 lb

## 2010-12-20 DIAGNOSIS — I6529 Occlusion and stenosis of unspecified carotid artery: Secondary | ICD-10-CM

## 2010-12-20 DIAGNOSIS — I70219 Atherosclerosis of native arteries of extremities with intermittent claudication, unspecified extremity: Secondary | ICD-10-CM

## 2010-12-20 DIAGNOSIS — I739 Peripheral vascular disease, unspecified: Secondary | ICD-10-CM

## 2010-12-20 NOTE — Assessment & Plan Note (Signed)
The patient has mildly lifestyle limiting claudication. Her ABI on the right is normal and her ABI on the left is in the moderate range. From a symptomatic perspective, both legs are equally affected. She is on an excellent medical regimen under the care of Dr. Elease Hashimoto. I don't think her symptoms for it angiography or intervention at this point. She has a total occlusion and so she should have stable disease unless she develops new disease at a different level. I have recommended followup ABIs in 12 months with an office visit at that time.  I consider the addition of cilostazol but she is already taking aspirin and Plavix and I don't think her symptoms warrant another medication.

## 2010-12-20 NOTE — Patient Instructions (Signed)
Your physician wants you to follow-up in: 12 months. You will receive a reminder letter in the mail two months in advance. If you don't receive a letter, please call our office to schedule the follow-up appointment.  Your physician has requested that you have an ankle brachial index (ABI). During this test an ultrasound and blood pressure cuff are used to evaluate the arteries that supply the arms and legs with blood. Allow thirty minutes for this exam. There are no restrictions or special instructions.To be done in 12 months prior to office visit with Dr. Excell Seltzer

## 2010-12-20 NOTE — Assessment & Plan Note (Signed)
The patient is followed at the vein and vascular surgery office. She reported no stroke or TIA symptoms.

## 2010-12-20 NOTE — Progress Notes (Signed)
HPI:  This is a delightful 77 year old woman presenting for initial evaluation of lower extremity peripheral arterial disease. The patient had noted weakness in her legs and she was referred for ABIs and a duplex study. This demonstrated ABIs of 98% on the right is 56% on the left. Heavily calcified lower extremity arteries were noted. There was no segmental stenosis on the right. There was a short segment occlusion of the mid left SFA which reconstitutes via collaterals.  The patient describes bilateral calf pain with walking. This feels like a "tightness" in her calf muscles and it is alleviated with rest. She is not particularly limited by this. She is able to do her grocery shopping and normal activities. She doesn't July walking but think she could probably walk about 100 yards. She denies chest pain or pressure at present. She has no history of ulceration or nonhealing wounds. She denies any pain at rest.  Outpatient Encounter Prescriptions as of 12/20/2010  Medication Sig Dispense Refill  . amLODipine (NORVASC) 10 MG tablet Take 10 mg by mouth daily.        Marland Kitchen aspirin 81 MG tablet Take 81 mg by mouth every morning.        Marland Kitchen atorvastatin (LIPITOR) 20 MG tablet Take 20 mg by mouth daily.        . calcium-vitamin D (OSCAL WITH D) 500-200 MG-UNIT per tablet Take 1 tablet by mouth daily.        . clopidogrel (PLAVIX) 75 MG tablet Take 75 mg by mouth daily. Dr. Melburn Popper prescribes      . esomeprazole (NEXIUM) 40 MG capsule Take 1 capsule (40 mg total) by mouth daily.  30 capsule  1  . furosemide (LASIX) 40 MG tablet Take 40 mg by mouth daily. for swelling       . isosorbide mononitrate (IMDUR) 60 MG 24 hr tablet Take 60 mg by mouth daily.       Marland Kitchen levothyroxine (SYNTHROID, LEVOTHROID) 50 MCG tablet Take 50 mcg by mouth daily.        . metFORMIN (GLUCOPHAGE) 500 MG tablet Take 1/2  tablet by mouth once a day.       . metoprolol succinate (TOPROL-XL) 25 MG 24 hr tablet Take 1.5 tablets (37.5 mg total) by  mouth daily.  60 tablet  1  . nitroGLYCERIN (NITROSTAT) 0.4 MG SL tablet Place 1 tablet (0.4 mg total) under the tongue every 5 (five) minutes as needed for chest pain.  90 tablet  3  . potassium chloride SA (K-DUR,KLOR-CON) 20 MEQ tablet Take 20 mEq by mouth 2 (two) times daily.       . ranolazine (RANEXA) 500 MG 12 hr tablet Take 500 mg by mouth 2 (two) times daily. Dr. Melburn Popper prescribes         Review of patient's allergies indicates no known allergies.  Past Medical History  Diagnosis Date  . Hypertension   . Hyperlipidemia   . MI (myocardial infarction)     Triple Bypass 1989  . Diabetes mellitus   . Chronic cough     Seen by Dr. Sherene Sires  . Carotid bruit     Left, Monitor seen by [facility]  . Thyroid disease     Hypothyroidism  . CAD (coronary artery disease)   . Complete heart block     s/p PPM (MDT) by Dr Deborah Chalk 2004  . Carotid artery occlusion 2011    60-80%right/ 40-60 left    Past Surgical History  Procedure Date  .  Coronary artery bypass graft 1989  . Pacemaker placement 2004    MDT by Dr Deborah Chalk  . Cataract extraction 2004    Both    History   Social History  . Marital Status: Widowed    Spouse Name: N/A    Number of Children: 2  . Years of Education: N/A   Occupational History  . 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 Smoking status: Former Smoker    Quit date: 12/15/1973  . Smokeless tobacco: Never Used  . Alcohol Use: No  . Drug Use: No  . Sexually Active: No   Other Topics Concern  . Not on file   Social History Narrative   Health Care POA: son, Marcial Pacas JonesEmergency Contact: son, Takeia Ciaravino [phone] of Life Plan: Who lives with you: SelfAny pets: noneDiet: Patient has a varied diet of protein, starch, vegetables, and fruitExercise: Patient does group exercises 2x week and walks 2x week.Seatbelts: Patient reports wearing seatbelt when in vehicle. Sun Exposure/Protection: Patient reports not using sun  protection.Hobbies: Church, watching tv, walking    Family History  Problem Relation Age of Onset  . Hypertension Mother   . Liver cancer Brother   . Breast cancer Daughter   . Colon cancer Brother   . Cancer Brother     colon    ROS: General: no fevers/chills/night sweats Eyes: no blurry vision, diplopia, or amaurosis ENT: no sore throat or hearing loss Resp: no cough, wheezing, or hemoptysis CV: no edema or palpitations GI: no abdominal pain, nausea, vomiting, diarrhea, or constipation GU: no dysuria, frequency, or hematuria Skin: no rash Neuro: no headache, numbness, tingling, or weakness of extremities Musculoskeletal: no joint pain or swelling Heme: no bleeding, DVT, or easy bruising Endo: no polydipsia or polyuria  BP 144/62  Pulse 70  Ht 5\' 3"  (1.6 m)  Wt 62.143 kg (137 lb)  BMI 24.27 kg/m2  PHYSICAL EXAM: Pt is alert and oriented, WD, WN, in no distress. HEENT: normal Neck: JVP normal. Carotid upstrokes normal without bruits. No thyromegaly. Lungs: equal expansion, clear bilaterally CV: Apex is discrete and nondisplaced, RRR without murmur or gallop Abd: soft, NT, +BS, no bruit, no hepatosplenomegaly Back: no CVA tenderness Ext: no C/C/E        Femoral pulses 2+= without bruits        Posterior tibial pulses are diminished bilaterally. Good DP pulse on the right is 2+. The DP pulse on the left is 1+. The feet are warm and appear well perfused. Skin: warm and dry without rash Neuro: CNII-XII intact             Strength intact = bilaterally  ASSESSMENT AND PLAN:

## 2010-12-23 ENCOUNTER — Ambulatory Visit (INDEPENDENT_AMBULATORY_CARE_PROVIDER_SITE_OTHER): Payer: Medicare Other | Admitting: Internal Medicine

## 2010-12-23 ENCOUNTER — Encounter: Payer: Self-pay | Admitting: Internal Medicine

## 2010-12-23 DIAGNOSIS — E039 Hypothyroidism, unspecified: Secondary | ICD-10-CM

## 2010-12-23 DIAGNOSIS — I6529 Occlusion and stenosis of unspecified carotid artery: Secondary | ICD-10-CM

## 2010-12-23 DIAGNOSIS — E119 Type 2 diabetes mellitus without complications: Secondary | ICD-10-CM

## 2010-12-23 DIAGNOSIS — I10 Essential (primary) hypertension: Secondary | ICD-10-CM

## 2010-12-23 MED ORDER — CLOBETASOL PROPIONATE 0.05 % EX CREA: TOPICAL_CREAM | CUTANEOUS | Status: DC

## 2010-12-23 MED ORDER — NITROGLYCERIN 0.4 MG SL SUBL: 0.4000 mg | SUBLINGUAL_TABLET | SUBLINGUAL | Status: DC | PRN

## 2010-12-23 NOTE — Assessment & Plan Note (Signed)
Lab Results  Component Value Date   HGBA1C 6.3* 09/11/2010   On low dose metformin The current medical regimen is effective;  continue present plan and medications.

## 2010-12-23 NOTE — Assessment & Plan Note (Addendum)
Follows at [facility] for same - annual carotids, last done 11/2010 -  no new neuro symptoms Also note PVD LE R>L s/p eval/ABI by vasc cards ([facility]) - plan annual follow up ABI continue medical mgmt and call sooner if symptoms

## 2010-12-23 NOTE — Assessment & Plan Note (Signed)
Lab Results  Component Value Date   TSH 3.196 09/11/2010   The current medical regimen is effective;  continue present plan and medications.

## 2010-12-23 NOTE — Assessment & Plan Note (Signed)
BP Readings from Last 3 Encounters:  12/23/10 118/60  12/20/10 144/62  11/04/10 152/70   The current medical regimen is effective;  continue present plan and medications.

## 2010-12-23 NOTE — Progress Notes (Signed)
Subjective:    Patient ID: Sydney Moore, female    DOB: 1923-10-17, 77 y.o.   MRN: [id number]  HPI  New patient to me and our division, [facility] Known to our cardiology division she follows for cardiac vascular issues Here today to establish care  Reviewed chronic medical issues: Type 2 diabetes. On low-dose metformin for same. Checks her sugars 1x/day, denies symptoms of hypoglycemia  Coronary artery disease. Status post CABG x3 in 1989 following 9. On aspirin, Plavix, statin, beta blocker and reports medication compliance as prescribed. Recent stress test adenosine 09/26/2010 negative for ischemia. Chest pain hospitalization August 2012 improved with proton pump inhibitor. No anginal symptoms at this time  Hypothyroidism - the patient reports compliance with medication(s) as prescribed. Denies adverse side effects.  Past Medical History  Diagnosis Date  . Hypertension   . Hyperlipidemia   . MI (myocardial infarction) 1989    Triple Bypass   . Diabetes mellitus   . Chronic cough     Seen by Dr. Sherene Moore  . Hypothyroid   . CAD (coronary artery disease) 1989    s/p CABGx3  . Complete heart block     s/p PPM (MDT) by Dr Deborah Chalk 2004  . Carotid artery occlusion 2011    60-80%right/ 40-60 left   [name] does not currently have medications on file. History   Social History  . Marital Status: Widowed    Spouse Name: N/A    Number of Children: 2  . Years of Education: N/A   Occupational History  . 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 Smoking status: Former Smoker    Quit date: 12/15/1973  . Smokeless tobacco: Never Used  . Alcohol Use: No  . Drug Use: No  . Sexually Active: No   Other Topics Concern  . Not on file   Social History Narrative   Health Care POA: son, Sydney Pacas JonesEmergency Contact: son, Sydney Moore [phone] of Life Plan: Who lives with you: SelfAny pets: noneDiet: Patient has  a varied diet of protein, starch, vegetables, and fruitExercise: Patient does group exercises 2x week and walks 2x week.Seatbelts: Patient reports wearing seatbelt when in vehicle. Sun Exposure/Protection: Patient reports not using sun protection.Hobbies: Church, watching tv, walking     Review of Systems Constitutional: Negative for fever or weight change.  Respiratory: Negative for cough and shortness of breath.   Cardiovascular: Negative for chest pain or palpitations.  Gastrointestinal: Negative for abdominal pain, no bowel changes.  Musculoskeletal: Negative for gait problem or joint swelling.  Skin: Negative for rash.  Neurological: Negative for dizziness or headache.  No other specific complaints in a complete review of systems (except as listed in HPI above).     Objective:   Physical Exam BP 118/60  Pulse 86  Temp(Src) 97.3 F (36.3 C) (Oral)  Resp 14  Ht 5\' 2"  (1.575 m)  Wt 137 lb 4 oz (62.256 kg)  BMI 25.10 kg/m2  SpO2 95% Wt Readings from Last 3 Encounters:  12/23/10 137 lb 4 oz (62.256 kg)  12/20/10 137 lb (62.143 kg)  11/04/10 140 lb (63.504 kg)   Constitutional: She appears well-developed and well-nourished. No distress.  HENT: Head: Normocephalic and atraumatic. Ears: B TMs ok, no erythema or effusion; Nose: Nose normal.  Mouth/Throat: Oropharynx is clear and moist. No oropharyngeal exudate.  Eyes: Conjunctivae and EOM are normal. Pupils are equal, round, and reactive to light. No scleral icterus.  Neck: Normal range of motion. Neck supple. No JVD present. No thyromegaly present.  Cardiovascular: Normal rate, regular rhythm and normal heart sounds.  No murmur heard. No BLE edema. Pulmonary/Chest: Effort normal and breath sounds normal. No respiratory distress. She has no wheezes.  Abdominal: Soft. Bowel sounds are normal. She exhibits no distension. There is no tenderness. no masses Musculoskeletal: Normal range of motion, no joint effusions. No gross  deformities Neurological: She is alert and oriented to person, place, and time. No cranial nerve deficit. Coordination normal.  Skin: Skin is warm and dry. No rash noted. No erythema.  Psychiatric: She has a normal mood and affect. Her behavior is normal. Judgment and thought content normal.   Lab Results  Component Value Date   WBC 4.1* 11/05/2010   HGB 11.9* 11/05/2010   HCT 35.8* 11/05/2010   PLT 223.0 11/05/2010   GLUCOSE 121* 11/05/2010   CHOL 126 09/12/2010   TRIG 105 09/12/2010   HDL 49 09/12/2010   LDLDIRECT 58 12/18/2008   LDLCALC 56 09/12/2010   ALT 22 03/08/2010   AST 30 03/08/2010   NA 139 11/05/2010   K 3.5 11/05/2010   CL 104 11/05/2010   CREATININE 1.0 11/05/2010   BUN 14 11/05/2010   CO2 28 11/05/2010   TSH 3.196 09/11/2010   INR 1.06 11/12/2010   HGBA1C 6.3* 09/11/2010        Assessment & Plan:  See problem list. Medications and labs reviewed today. Time spent with pt today 35 minutes, greater than 50% time spent counseling patient on diabetes, PVD and medication review. Also review of prior records

## 2010-12-23 NOTE — Patient Instructions (Signed)
It was good to see you today. We have reviewed your prior records including recent labs and tests today Medications reviewed, no changes at this time. Refill on medication(s) as discussed today. Please schedule followup in 3-4 months for diabetes mellitus and thyroid check, call sooner if problems.

## 2010-12-24 ENCOUNTER — Encounter: Payer: Self-pay | Admitting: Surgery

## 2010-12-24 NOTE — Procedures (Unsigned)
CAROTID DUPLEX EXAM  INDICATION:  Followup carotid disease.  HISTORY: Diabetes:  Yes. Cardiac:  Yes. Hypertension:  Yes. Smoking:  Previous. Previous Surgery:  No. CV History: Amaurosis Fugax No, Paresthesias No, Hemiparesis No                                      RIGHT             LEFT Brachial systolic pressure:         124               120 Brachial Doppler waveforms:         WNL               WNL Vertebral direction of flow:        Antegrade         Antegrade DUPLEX VELOCITIES (cm/sec) CCA peak systolic                   44                44 ECA peak systolic                   98                404 ICA peak systolic                   207               168 ICA end diastolic                   48                30 PLAQUE MORPHOLOGY:                  Calcific          Calcific PLAQUE AMOUNT:                      Moderate          Moderate PLAQUE LOCATION:                    ICA               ICA / ECA  IMPRESSION: 1. 40%-59% right internal carotid artery stenosis. 2. 1%-39% left internal carotid artery stenosis. 3. Bilateral internal carotid artery is tortuous with calcific plaque     which may underestimate recorded velocities. 4. Significant stenosis of left external carotid artery.  ___________________________________________ V. Charlena Cross, MD  LT/MEDQ  D:  12/02/2010  T:  12/02/2010  Job:  [id number]

## 2010-12-27 ENCOUNTER — Other Ambulatory Visit: Payer: Self-pay | Admitting: Vascular Surgery

## 2010-12-27 DIAGNOSIS — Z48812 Encounter for surgical aftercare following surgery on the circulatory system: Secondary | ICD-10-CM

## 2010-12-27 DIAGNOSIS — I6529 Occlusion and stenosis of unspecified carotid artery: Secondary | ICD-10-CM

## 2011-01-15 ENCOUNTER — Encounter: Payer: Self-pay | Admitting: Internal Medicine

## 2011-01-15 ENCOUNTER — Ambulatory Visit (INDEPENDENT_AMBULATORY_CARE_PROVIDER_SITE_OTHER): Payer: Medicare Other | Admitting: Internal Medicine

## 2011-01-15 VITALS — BP 132/60 | HR 78 | Temp 97.5°F | Wt 135.8 lb

## 2011-01-15 DIAGNOSIS — R05 Cough: Secondary | ICD-10-CM

## 2011-01-15 MED ORDER — PROMETHAZINE-CODEINE 6.25-10 MG/5ML PO SYRP: 5.0000 mL | ORAL_SOLUTION | ORAL | Status: DC | PRN

## 2011-01-15 MED ORDER — PREDNISONE (PAK) 10 MG PO TABS: 10.0000 mg | ORAL_TABLET | ORAL | Status: AC

## 2011-01-15 NOTE — Patient Instructions (Signed)
It was good to see you today. If you develop worsening symptoms or fever, call and we can reconsider antibiotics, but it does not appear necessary to use antibiotics at this time. Prednisone pack for the next 6 days to calm inflammation and codeine cough syrup at night for cough symptoms - Your prescription(s) have been submitted to your pharmacy. Please take as directed and contact our office if you believe you are having problem(s) with the medication(s). Continue warm salt gargle as needed and use Tylenol or Advil for aches and pains

## 2011-01-15 NOTE — Progress Notes (Signed)
  Subjective:    Patient ID: Sydney Moore, female    DOB: 09-29-1923, 77 y.o.   MRN: [id number]  HPI  complains of cough - mostly dry Onset 4 days ago, symptoms progressively worse symptoms worse at night Not associated with fever, sore throat, sinus pressure; +sneezing Precipitated by sick contacts g-dtr  Review of Systems  Respiratory: Negative for shortness of breath and wheezing.   Cardiovascular: Negative for chest pain and palpitations.       Objective:   Physical Exam  BP 132/60  Pulse 78  Temp(Src) 97.5 F (36.4 C) (Oral)  Wt 135 lb 12.8 oz (61.598 kg)  SpO2 99% Wt Readings from Last 3 Encounters:  01/15/11 135 lb 12.8 oz (61.598 kg)  12/23/10 137 lb 4 oz (62.256 kg)  12/20/10 137 lb (62.143 kg)    Subjective:    HPI  complains of cold symptoms  Onset >1 week ago, wax/wane symptoms  associated with rhinorrhea, sneezing, sore throat, mild headache and low grade fever Also myalgias, sinus pressure and mild-mod chest congestion No relief with OTC meds Precipitated by sick contacts  Past Medical History  Diagnosis Date  . Hypertension   . Hyperlipidemia   . MI (myocardial infarction) 1989    Triple Bypass   . Diabetes mellitus   . Chronic cough     Seen by Dr. Sherene Sires  . Hypothyroid   . CAD (coronary artery disease) 1989    s/p CABGx3  . Complete heart block     s/p PPM (MDT) by Dr Deborah Chalk 2004; battery exchange 09/2010  . Carotid artery occlusion 2011    60-80%right/ 40-60 left    Review of Systems Constitutional: No fever or night sweats, no unexpected weight change Pulmonary: No pleurisy or hemoptysis Cardiovascular: No chest pain or palpitations     Objective:   Physical Exam BP 132/60  Pulse 78  Temp(Src) 97.5 F (36.4 C) (Oral)  Wt 135 lb 12.8 oz (61.598 kg)  SpO2 99% GEN: nontoxic appearing, no audible chest congestion HENT: NCAT, no sinus tenderness bilaterally, nares with clear discharge, oropharynx mild erythema, no  exudate Eyes: Vision grossly intact, no conjunctivitis Lungs: Clear to auscultation without rhonchi - few end exp wheeze, no increased work of breathing Cardiovascular: Regular rate and rhythm, no bilateral edema      Assessment & Plan:  Viral URI  Cough, postnasal drip related to above   Explained lack of efficacy for antibiotics in viral disease Prescription cough suppression - new prescriptions done Also pred pak for inflammation Symptomatic care with Tylenol or Advil, hydration and rest -  salt gargle advised as needed

## 2011-03-13 ENCOUNTER — Encounter: Payer: Self-pay | Admitting: Internal Medicine

## 2011-03-18 ENCOUNTER — Encounter: Payer: Self-pay | Admitting: Internal Medicine

## 2011-03-18 ENCOUNTER — Ambulatory Visit (INDEPENDENT_AMBULATORY_CARE_PROVIDER_SITE_OTHER): Payer: Medicare Other | Admitting: Internal Medicine

## 2011-03-18 DIAGNOSIS — I10 Essential (primary) hypertension: Secondary | ICD-10-CM

## 2011-03-18 DIAGNOSIS — R0609 Other forms of dyspnea: Secondary | ICD-10-CM

## 2011-03-18 DIAGNOSIS — I442 Atrioventricular block, complete: Secondary | ICD-10-CM

## 2011-03-18 DIAGNOSIS — I2510 Atherosclerotic heart disease of native coronary artery without angina pectoris: Secondary | ICD-10-CM | POA: Insufficient documentation

## 2011-03-18 DIAGNOSIS — I5022 Chronic systolic (congestive) heart failure: Secondary | ICD-10-CM | POA: Insufficient documentation

## 2011-03-18 DIAGNOSIS — R0600 Dyspnea, unspecified: Secondary | ICD-10-CM

## 2011-03-18 NOTE — Assessment & Plan Note (Signed)
Her blood pressure appears to be well-controlled. She will maintain a low-sodium diet and continue her current medical therapy.

## 2011-03-18 NOTE — Patient Instructions (Addendum)
Your physician wants you to follow-up in: Oct 2013 with Dr Johney Frame Bonita Quin will receive a reminder letter in the mail two months in advance. If you don't receive a letter, please call our office to schedule the follow-up appointment.   Your physician has requested that you have an echocardiogram. Echocardiography is a painless test that uses sound waves to create images of your heart. It provides your doctor with information about the size and shape of your heart and how well your heart's chambers and valves are working. This procedure takes approximately one hour. There are no restrictions for this procedure.---SOB

## 2011-03-18 NOTE — Progress Notes (Signed)
HPI Sydney Moore returns today for followup. She is a very pleasant 78 year old woman with a history of coronary disease status post bypass grafting, complete heart block status post pacemaker insertion, hypertension, and dyslipidemia. The last few months, she has noted increasing shortness of breath with exertion. She denies chest pain, or peripheral edema. She admits to dietary indiscretion with sodium. She denies medical noncompliance. No Known Allergies   Current Outpatient Prescriptions  Medication Sig Dispense Refill  . amLODipine (NORVASC) 10 MG tablet Take 10 mg by mouth daily.        Marland Kitchen aspirin 81 MG tablet Take 81 mg by mouth every morning.        Marland Kitchen atorvastatin (LIPITOR) 20 MG tablet Take 20 mg by mouth daily.        . calcium-vitamin D (OSCAL WITH D) 500-200 MG-UNIT per tablet Take 1 tablet by mouth daily.        . clobetasol (TEMOVATE) 0.05 % cream Apply to affected on skin twice a day as needed for rash  30 g  1  . clopidogrel (PLAVIX) 75 MG tablet Take 75 mg by mouth daily. Dr. Melburn Popper prescribes      . esomeprazole (NEXIUM) 40 MG capsule Take 1 capsule (40 mg total) by mouth daily.  30 capsule  1  . furosemide (LASIX) 40 MG tablet Take 40 mg by mouth daily. for swelling       . isosorbide mononitrate (IMDUR) 60 MG 24 hr tablet Take 60 mg by mouth daily.       Marland Kitchen levothyroxine (SYNTHROID, LEVOTHROID) 50 MCG tablet Take 50 mcg by mouth daily.        . metFORMIN (GLUCOPHAGE) 500 MG tablet Take 1/2  tablet by mouth once a day.       . metoprolol succinate (TOPROL-XL) 25 MG 24 hr tablet Take 1.5 tablets (37.5 mg total) by mouth daily.  60 tablet  1  . nitroGLYCERIN (NITROSTAT) 0.4 MG SL tablet Place 1 tablet (0.4 mg total) under the tongue every 5 (five) minutes as needed for chest pain.  90 tablet  3  . potassium chloride SA (K-DUR,KLOR-CON) 20 MEQ tablet Take 20 mEq by mouth 2 (two) times daily.       . ranolazine (RANEXA) 500 MG 12 hr tablet Take 500 mg by mouth 2 (two) times daily. Dr.  Melburn Popper prescribes          Past Medical History  Diagnosis Date  . Hypertension   . Hyperlipidemia   . MI (myocardial infarction) 1989    Triple Bypass   . Diabetes mellitus   . Chronic cough     Seen by Dr. Sherene Sires  . Hypothyroid   . CAD (coronary artery disease) 1989    s/p CABGx3  . Complete heart block     s/p PPM (MDT) by Dr Deborah Chalk 2004; battery exchange 09/2010  . Carotid artery occlusion 2011    60-80%right/ 40-60 left  . GERD (gastroesophageal reflux disease)   . CVD (cerebrovascular disease)   . CHF (congestive heart failure)     EF of 35%  . Left ventricular hypertrophy     ROS:   All systems reviewed and negative except as noted in the HPI.   Past Surgical History  Procedure Date  . Coronary artery bypass graft 1989  . Pacemaker placement 2004    MDT by Dr Deborah Chalk  . Cataract extraction 2004    Both  . Cardiac catheterization 2004, 2007, 2009  Family History  Problem Relation Age of Onset  . Hypertension Mother   . Liver cancer Brother   . Breast cancer Daughter   . Colon cancer Brother   . Cancer Brother     colon     History   Social History  . Marital Status: Widowed    Spouse Name: N/A    Number of Children: 2  . Years of Education: N/A   Occupational History  . 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 Smoking status: Former Smoker    Quit date: 12/15/1973  . Smokeless tobacco: Never Used  . Alcohol Use: No  . Drug Use: No  . Sexually Active: No   Other Topics Concern  . Not on file   Social History Narrative   Health Care POA: son, Marcial Pacas JonesEmergency Contact: son, Billijo Dilling [phone] of Life Plan: Who lives with you: SelfAny pets: noneDiet: Patient has a varied diet of protein, starch, vegetables, and fruitExercise: Patient does group exercises 2x week and walks 2x week.Seatbelts: Patient reports wearing seatbelt when in vehicle. Sun Exposure/Protection: Patient reports not using sun  protection.Hobbies: Church, watching tv, walking     BP 134/72  Pulse 77  Ht 5\' 3"  (1.6 m)  Wt 61.29 kg (135 lb 1.9 oz)  BMI 23.94 kg/m2  Physical Exam:  Well appearing elderly woman, who looks younger than her stated age, and in NAD HEENT: Unremarkable Neck:  No JVD, no thyromegally Lungs:  Clear with no wheezes, rales, or rhonchi. HEART:  Regular rate rhythm, no murmurs, no rubs, no clicks Abd:  soft, positive bowel sounds, no organomegally, no rebound, no guarding Ext:  2 plus pulses, no edema, no cyanosis, no clubbing Skin:  No rashes no nodules Neuro:  CN II through XII intact, motor grossly intact  DEVICE  Normal device function.  See PaceArt for details.   Assess/Plan:

## 2011-03-18 NOTE — Assessment & Plan Note (Signed)
She denies anginal symptoms. I wonder if dyspnea could be related to this. She will continue her current medical therapy and maintain a low-salt diet and try to increase physical activity.

## 2011-03-18 NOTE — Assessment & Plan Note (Signed)
This appears to be a new problem. The etiology is unclear. I recommended a 2-D echo. If her left ventricular function remains preserved, we will ask the patient to up titrate her diuretic therapy to reduce her salt intake. If her left ventricular systolic function is down, we might consider adding a left ventricular lead.

## 2011-03-25 ENCOUNTER — Other Ambulatory Visit: Payer: Self-pay

## 2011-03-25 ENCOUNTER — Ambulatory Visit (HOSPITAL_COMMUNITY): Payer: Medicare Other | Attending: Cardiology

## 2011-03-25 DIAGNOSIS — I442 Atrioventricular block, complete: Secondary | ICD-10-CM

## 2011-03-25 DIAGNOSIS — R0989 Other specified symptoms and signs involving the circulatory and respiratory systems: Secondary | ICD-10-CM | POA: Insufficient documentation

## 2011-03-25 DIAGNOSIS — I10 Essential (primary) hypertension: Secondary | ICD-10-CM | POA: Insufficient documentation

## 2011-03-25 DIAGNOSIS — E785 Hyperlipidemia, unspecified: Secondary | ICD-10-CM | POA: Insufficient documentation

## 2011-03-25 DIAGNOSIS — R0609 Other forms of dyspnea: Secondary | ICD-10-CM | POA: Insufficient documentation

## 2011-03-25 DIAGNOSIS — I509 Heart failure, unspecified: Secondary | ICD-10-CM | POA: Insufficient documentation

## 2011-03-25 DIAGNOSIS — Z87891 Personal history of nicotine dependence: Secondary | ICD-10-CM | POA: Insufficient documentation

## 2011-03-25 DIAGNOSIS — R0600 Dyspnea, unspecified: Secondary | ICD-10-CM

## 2011-03-25 DIAGNOSIS — I252 Old myocardial infarction: Secondary | ICD-10-CM | POA: Insufficient documentation

## 2011-03-25 DIAGNOSIS — I2510 Atherosclerotic heart disease of native coronary artery without angina pectoris: Secondary | ICD-10-CM | POA: Insufficient documentation

## 2011-04-22 ENCOUNTER — Ambulatory Visit (INDEPENDENT_AMBULATORY_CARE_PROVIDER_SITE_OTHER): Payer: Medicare Other | Admitting: Internal Medicine

## 2011-04-22 ENCOUNTER — Encounter: Payer: Self-pay | Admitting: Internal Medicine

## 2011-04-22 ENCOUNTER — Other Ambulatory Visit (INDEPENDENT_AMBULATORY_CARE_PROVIDER_SITE_OTHER): Payer: Medicare Other

## 2011-04-22 DIAGNOSIS — E119 Type 2 diabetes mellitus without complications: Secondary | ICD-10-CM

## 2011-04-22 DIAGNOSIS — I10 Essential (primary) hypertension: Secondary | ICD-10-CM

## 2011-04-22 DIAGNOSIS — I5030 Unspecified diastolic (congestive) heart failure: Secondary | ICD-10-CM

## 2011-04-22 DIAGNOSIS — N3946 Mixed incontinence: Secondary | ICD-10-CM

## 2011-04-22 DIAGNOSIS — E039 Hypothyroidism, unspecified: Secondary | ICD-10-CM

## 2011-04-22 LAB — TSH: TSH: 2.59 u[IU]/mL (ref 0.35–5.50)

## 2011-04-22 LAB — BASIC METABOLIC PANEL
Chloride: 100 mEq/L (ref 96–112)
Potassium: 3.6 mEq/L (ref 3.5–5.1)

## 2011-04-22 LAB — HEMOGLOBIN A1C: Hgb A1c MFr Bld: 6.4 % (ref 4.6–6.5)

## 2011-04-22 MED ORDER — AMLODIPINE BESYLATE 10 MG PO TABS: 10.0000 mg | ORAL_TABLET | Freq: Every day | ORAL | Status: DC

## 2011-04-22 NOTE — Assessment & Plan Note (Signed)
BP Readings from Last 3 Encounters:  04/22/11 130/62  03/18/11 134/72  01/15/11 132/60   The current medical regimen is effective;  continue present plan and medications.

## 2011-04-22 NOTE — Assessment & Plan Note (Signed)
2d echo 03/2011 with LVEF 40% and grade 1 diastolic dysfx Pt feels improved on increase lasix from 40 to 60mg  qd - ?other tx Will defer to cards on same - follow up planned 3/22 The current medical regimen is effective;  continue present plan and medications.

## 2011-04-22 NOTE — Patient Instructions (Signed)
It was good to see you today. We have reviewed your interval records including recent labs and tests today Medications reviewed, no changes at this time. Test(s) ordered today. Your results will be called to you after review (48-72hours after test completion). If any changes need to be made, you will be notified at that time. we'll make referral to urology for your incontinence as requested. Our office will contact you regarding appointment(s) once made. Please schedule followup in 6 months for diabetes mellitus and thyroid check, call sooner if problems.

## 2011-04-22 NOTE — Assessment & Plan Note (Signed)
Lab Results  Component Value Date   HGBA1C 6.3* 09/11/2010   On low dose metformin The current medical regimen is effective;  continue present plan and medications.  

## 2011-04-22 NOTE — Progress Notes (Signed)
Subjective:    Patient ID: Sydney Moore, female    DOB: December 06, 1923, 78 y.o.   MRN: [id number]  HPI  Here for follow up - reviewed chronic medical issues:  Type 2 diabetes. On low-dose metformin for same. Checks her sugars 1x/day, denies symptoms of hypoglycemia  Coronary artery disease. Status post CABG x3 in 1989 following 9. On aspirin, Plavix, statin, beta blocker and reports medication compliance as prescribed. Recent stress test adenosine 09/26/2010 negative for ischemia. Chest pain hospitalization August 2012 improved with proton pump inhibitor. No anginal symptoms at this time  Hypothyroidism - the patient reports compliance with medication(s) as prescribed. Denies adverse side effects.  Past Medical History  Diagnosis Date  . Hypertension   . Hyperlipidemia   . MI (myocardial infarction) 1989    Triple Bypass   . Diabetes mellitus   . Chronic cough     Seen by Dr. Sherene Sires  . Hypothyroid   . CAD (coronary artery disease) 1989    s/p CABGx3  . Complete heart block     s/p PPM (MDT) by Dr Deborah Chalk 2004; battery exchange 09/2010  . Carotid artery occlusion 2011    60-80%right/ 40-60 left  . GERD (gastroesophageal reflux disease)   . CVD (cerebrovascular disease)   . CHF (congestive heart failure)     EF of 35%  . Left ventricular hypertrophy     Review of Systems  Constitutional: Negative for fever or weight change.  Respiratory: Negative for cough.   Cardiovascular: Negative for chest pain or palpitations.      Objective:   Physical Exam  BP 130/62  Pulse 88  Temp(Src) 97.1 F (36.2 C) (Oral)  Ht 5\' 2"  (1.575 m)  Wt 132 lb 12.8 oz (60.238 kg)  BMI 24.29 kg/m2  SpO2 99% Wt Readings from Last 3 Encounters:  04/22/11 132 lb 12.8 oz (60.238 kg)  03/18/11 135 lb 1.9 oz (61.29 kg)  01/15/11 135 lb 12.8 oz (61.598 kg)   Constitutional: She appears well-developed and well-nourished. No distress.  Neck: Normal range of motion. Neck supple. No JVD present. No  thyromegaly present.  Cardiovascular: Normal rate, regular rhythm and normal heart sounds.  No murmur heard. No BLE edema. Pulmonary/Chest: Effort normal and breath sounds normal. No respiratory distress. She has no wheezes.  Psychiatric: She has a normal mood and affect. Her behavior is normal. Judgment and thought content normal.   Lab Results  Component Value Date   WBC 4.1* 11/05/2010   HGB 11.9* 11/05/2010   HCT 35.8* 11/05/2010   PLT 223.0 11/05/2010   GLUCOSE 121* 11/05/2010   CHOL 126 09/12/2010   TRIG 105 09/12/2010   HDL 49 09/12/2010   LDLDIRECT 58 12/18/2008   LDLCALC 56 09/12/2010   ALT 22 03/08/2010   AST 30 03/08/2010   NA 139 11/05/2010   K 3.5 11/05/2010   CL 104 11/05/2010   CREATININE 1.0 11/05/2010   BUN 14 11/05/2010   CO2 28 11/05/2010   TSH 3.196 09/11/2010   INR 1.06 11/12/2010   HGBA1C 6.3* 09/11/2010   2d echo 03/25/2011: Left ventricle: There is septal dyssynergy from pacing. There is inferolateral hypokinesis. The cavity size was normal. Wall thickness was increased in a pattern of moderate LVH. The estimated ejection fraction was 40%. Doppler parameters are consistent with abnormal left ventricular relaxation (grade 1 diastolic dysfunction).        Assessment & Plan:  See problem list. Medications and labs reviewed today.  Urinary incont with uterine  prolapse = prior pessary, not used in >2 years - request follow up with uro ([facility]) - will refer now at pt request

## 2011-04-25 ENCOUNTER — Telehealth: Payer: Self-pay | Admitting: Cardiovascular Disease

## 2011-04-28 ENCOUNTER — Ambulatory Visit (INDEPENDENT_AMBULATORY_CARE_PROVIDER_SITE_OTHER): Payer: Medicare Other | Admitting: Cardiovascular Disease

## 2011-04-28 ENCOUNTER — Encounter: Payer: Self-pay | Admitting: Cardiovascular Disease

## 2011-04-28 VITALS — BP 125/60 | HR 75 | Ht 62.0 in | Wt 130.0 lb

## 2011-04-28 DIAGNOSIS — I25810 Atherosclerosis of coronary artery bypass graft(s) without angina pectoris: Secondary | ICD-10-CM

## 2011-04-28 DIAGNOSIS — R0989 Other specified symptoms and signs involving the circulatory and respiratory systems: Secondary | ICD-10-CM

## 2011-04-28 DIAGNOSIS — I2510 Atherosclerotic heart disease of native coronary artery without angina pectoris: Secondary | ICD-10-CM

## 2011-04-28 DIAGNOSIS — I10 Essential (primary) hypertension: Secondary | ICD-10-CM

## 2011-04-28 DIAGNOSIS — I5042 Chronic combined systolic (congestive) and diastolic (congestive) heart failure: Secondary | ICD-10-CM

## 2011-04-28 MED ORDER — METOPROLOL SUCCINATE ER 25 MG PO TB24: 37.5000 mg | ORAL_TABLET | Freq: Every day | ORAL | Status: DC

## 2011-04-28 NOTE — Telephone Encounter (Signed)
Close  

## 2011-04-28 NOTE — Assessment & Plan Note (Signed)
Sydney Moore has chronic combined systolic and diastolic congestive heart failure. She's doing clinically better on the slightly higher dose of Lasix. We'll continue with her same medications. There is some dyssynchrony caused by the right ventricular pacing.  Her ejection fraction is 40 so I do not think it this warrants a biventricular pacer right away. We will need to we'll watch her very closely and will consider upgrading her to a biventricular pacer if her left ventricular systolic function falls.

## 2011-04-28 NOTE — Progress Notes (Signed)
Sydney Moore Date of Birth  [birth year] [facility] / [facility] [address]     [address] [place], [place]  [address] [phone]  Fax  [phone]   Problems:   History of Present Illness:  78 year old female with a history of coronary artery disease and pacemaker implant.  She has a history of peripheral vascular disease and has a known left carotid bruit which we are following.   She's had a history of coronary artery bypass grafting. She denies any shortness of breath or chest pain. She does complain of some leg weakness particularly with walking.  Check pacer generator changed in October. She has developed some keloid formation over the pacer incision  A recent echocardiogram reveals mild to moderate left ventricular dysfunction with an ejection fraction of 40%.  There is moderate mitral regurgitation and moderate tricuspid regurgitation. Right ventricle appears to be dilated. Her pulmonary pressures are at the upper limits of normal at 36 mmHg. Her Lasix dose was increased by Dr. Ladona Ridgel and she's feeling a bit better.   Current Outpatient Prescriptions on File Prior to Visit  Medication Sig Dispense Refill  . amLODipine (NORVASC) 10 MG tablet Take 1 tablet (10 mg total) by mouth daily.  30 tablet  5  . aspirin 81 MG tablet Take 81 mg by mouth every morning.        Marland Kitchen atorvastatin (LIPITOR) 20 MG tablet Take 20 mg by mouth daily.        . calcium-vitamin D (OSCAL WITH D) 500-200 MG-UNIT per tablet Take 1 tablet by mouth daily.        . clobetasol (TEMOVATE) 0.05 % cream Apply to affected on skin twice a day as needed for rash  30 g  1  . clopidogrel (PLAVIX) 75 MG tablet Take 75 mg by mouth daily. Dr. Melburn Popper prescribes      . esomeprazole (NEXIUM) 40 MG capsule Take 1 capsule (40 mg total) by mouth daily.  30 capsule  1  . furosemide (LASIX) 40 MG tablet Take 40 mg by mouth. Taking 1.5mg  Tablet for swelling      . isosorbide mononitrate (IMDUR) 60  MG 24 hr tablet Take 60 mg by mouth daily.       Marland Kitchen levothyroxine (SYNTHROID, LEVOTHROID) 50 MCG tablet Take 50 mcg by mouth daily.        . metFORMIN (GLUCOPHAGE) 500 MG tablet Take 1/2  tablet by mouth once a day.       . metoprolol succinate (TOPROL-XL) 25 MG 24 hr tablet Take 1.5 tablets (37.5 mg total) by mouth daily.  60 tablet  1  . nitroGLYCERIN (NITROSTAT) 0.4 MG SL tablet Place 1 tablet (0.4 mg total) under the tongue every 5 (five) minutes as needed for chest pain.  90 tablet  3  . potassium chloride SA (K-DUR,KLOR-CON) 20 MEQ tablet Take 20 mEq by mouth 2 (two) times daily.       . ranolazine (RANEXA) 500 MG 12 hr tablet Take 500 mg by mouth 2 (two) times daily. Dr. Melburn Popper prescribes         No Known Allergies  Past Medical History  Diagnosis Date  . Hypertension   . Hyperlipidemia   . MI (myocardial infarction) 1989    Triple Bypass   . Diabetes mellitus   . Chronic cough     Seen by Dr. Sherene Sires  . Hypothyroid   . CAD (coronary artery disease) 1989    s/p CABGx3  .  Complete heart block     s/p PPM (MDT) by Dr Deborah Chalk 2004; battery exchange 09/2010  . Carotid artery occlusion 2011    60-80%right/ 40-60 left  . GERD (gastroesophageal reflux disease)   . CVD (cerebrovascular disease)   . CHF (congestive heart failure)     EF of 35%  . Left ventricular hypertrophy     Past Surgical History  Procedure Date  . Coronary artery bypass graft 1989  . Pacemaker placement 2004    MDT by Dr Deborah Chalk  . Cataract extraction 2004    Both  . Cardiac catheterization 2004, 2007, 2009    History  Smoking status  . Former Smoker  . Quit date: 12/15/1973  Smokeless tobacco  . Never Used    History  Alcohol Use No    Family History  Problem Relation Age of Onset  . Hypertension Mother   . Liver cancer Brother   . Breast cancer Daughter   . Colon cancer Brother   . Cancer Brother     colon    Reviw of Systems:  Reviewed in the HPI.  All other systems are  negative.  Physical Exam: BP 125/60  Pulse 75  Ht 5\' 2"  (1.575 m)  Wt 130 lb (58.968 kg)  BMI 23.78 kg/m2 The patient is alert and oriented x 3.  The mood and affect are normal.   Skin: warm and dry.  Color is normal.    HEENT:   the sclera are nonicteric.  The mucous membranes are moist.  The carotids are 2+ with a left carotid bruit.  There is no thyromegaly.  Pulsatile next is inferior to her carotid arteries that are either do to a large V wave or perhaps do to her common carotid artery.  Lungs: clear.  The chest wall is non tender.    Heart: regular rate with a normal S1 and S2.  There are no murmurs, gallops, or rubs. The PMI is not displaced.  Her pacer is in the right subclavian and has a small amount of keloid.   Abdomen: good bowel sounds.  There is no guarding or rebound.  There is no hepatosplenomegaly or tenderness.  There are no masses.   Extremities:  no clubbing, cyanosis, or edema.  The legs are without rashes.  The distal pulses are diminished  Neuro:  Cranial nerves II - XII are intact.  Motor and sensory functions are intact.    The gait is normal.  Assessment / Plan:

## 2011-04-28 NOTE — Assessment & Plan Note (Signed)
Ms. Rease is doing well. She's not having episodes of chest pain. We'll continue with her same medications.

## 2011-04-28 NOTE — Patient Instructions (Signed)
Your physician wants you to follow-up in: 6 months  You will receive a reminder letter in the mail two months in advance. If you don't receive a letter, please call our office to schedule the follow-up appointment.  Your physician recommends that you continue on your current medications as directed. Please refer to the Current Medication list given to you today.  

## 2011-05-15 ENCOUNTER — Other Ambulatory Visit: Payer: Self-pay | Admitting: Internal Medicine

## 2011-05-15 DIAGNOSIS — Z1231 Encounter for screening mammogram for malignant neoplasm of breast: Secondary | ICD-10-CM

## 2011-06-18 ENCOUNTER — Ambulatory Visit: Payer: Medicare Other

## 2011-07-01 ENCOUNTER — Ambulatory Visit (INDEPENDENT_AMBULATORY_CARE_PROVIDER_SITE_OTHER): Payer: Medicare Other | Admitting: Internal Medicine

## 2011-07-01 ENCOUNTER — Ambulatory Visit
Admission: RE | Admit: 2011-07-01 | Discharge: 2011-07-01 | Disposition: A | Discharge status: Home / Self Care | Payer: Medicare Other | Source: Ambulatory Visit | Attending: Internal Medicine | Admitting: Internal Medicine

## 2011-07-01 ENCOUNTER — Encounter: Payer: Self-pay | Admitting: Internal Medicine

## 2011-07-01 VITALS — BP 110/62 | HR 79 | Temp 97.5°F | Ht 62.0 in | Wt 131.1 lb

## 2011-07-01 DIAGNOSIS — L259 Unspecified contact dermatitis, unspecified cause: Secondary | ICD-10-CM

## 2011-07-01 DIAGNOSIS — Z1231 Encounter for screening mammogram for malignant neoplasm of breast: Secondary | ICD-10-CM

## 2011-07-01 MED ORDER — CLOBETASOL PROPIONATE 0.05 % EX CREA: TOPICAL_CREAM | CUTANEOUS | Status: DC

## 2011-07-01 NOTE — Patient Instructions (Signed)
It was good to see you today. Continue your steroid cream as needed but avoid application to face Refill on medication(s) as discussed today. Contact Dermatitis Contact dermatitis is a rash that happens when something touches the skin. You touched something that irritates your skin, or you have allergies to something you touched. HOME CARE    Avoid the thing that caused your rash.   Keep your rash away from hot water, soap, sunlight, chemicals, and other things that might bother it.   Do not scratch your rash.   You can take cool baths to help stop itching.   Only take medicine as told by your doctor.   Keep all doctor visits as told.  GET HELP RIGHT AWAY IF:    Your rash is not better after 3 days.   Your rash gets worse.   Your rash is puffy (swollen), tender, red, sore, or warm.   You have problems with your medicine.  MAKE SURE YOU:    Understand these instructions.   Will watch your condition.   Will get help right away if you are not doing well or get worse.  Document Released: 11/24/2008 Document Revised: 01/16/2011 Document Reviewed: 07/02/2010 [facility] Patient Information 2012 [place], [place].

## 2011-07-01 NOTE — Progress Notes (Signed)
  Subjective:    Patient ID: Sydney Moore, female    DOB: 1923/08/07, 78 y.o.   MRN: [id number]  HPI  complains of rash on neck Onset 3 days ago precipitated by outdoor exposure Improved with steroid cream  Past Medical History  Diagnosis Date  . Hypertension   . Hyperlipidemia   . MI (myocardial infarction) 1989    Triple Bypass   . Diabetes mellitus   . Chronic cough     Seen by Dr. Sherene Sires  . Hypothyroid   . CAD (coronary artery disease) 1989    s/p CABGx3  . Complete heart block     s/p PPM (MDT) by Dr Deborah Chalk 2004; battery exchange 09/2010  . Carotid artery occlusion 2011    60-80%right/ 40-60 left  . GERD (gastroesophageal reflux disease)   . CVD (cerebrovascular disease)   . CHF (congestive heart failure)     LVEF 35%, LVH    Review of Systems  Constitutional: Negative for fever, fatigue and unexpected weight change.       Objective:   Physical Exam BP 110/62  Pulse 79  Temp(Src) 97.5 F (36.4 C) (Oral)  Ht 5\' 2"  (1.575 m)  Wt 131 lb 1.9 oz (59.476 kg)  BMI 23.98 kg/m2  SpO2 97% Gen: NAD Skin: fading hives L jaw line and neck - no ulceration or cellulitis  Lab Results  Component Value Date   WBC 4.1* 11/05/2010   HGB 11.9* 11/05/2010   HCT 35.8* 11/05/2010   PLT 223.0 11/05/2010   GLUCOSE 84 04/22/2011   CHOL 126 09/12/2010   TRIG 105 09/12/2010   HDL 49 09/12/2010   LDLDIRECT 58 12/18/2008   LDLCALC 56 09/12/2010   ALT 22 03/08/2010   AST 30 03/08/2010   NA 138 04/22/2011   K 3.6 04/22/2011   CL 100 04/22/2011   CREATININE 0.8 04/22/2011   BUN 11 04/22/2011   CO2 31 04/22/2011   TSH 2.59 04/22/2011   INR 1.06 11/12/2010   HGBA1C 6.4 04/22/2011       Assessment & Plan:  Urticaria - expect contact dermatitis - improved with topical steroids continue same and avoid recurrent exposure

## 2011-07-03 ENCOUNTER — Ambulatory Visit (INDEPENDENT_AMBULATORY_CARE_PROVIDER_SITE_OTHER): Payer: Medicare Other | Admitting: Internal Medicine

## 2011-07-03 ENCOUNTER — Encounter: Payer: Self-pay | Admitting: Internal Medicine

## 2011-07-03 VITALS — BP 120/62 | HR 79 | Temp 97.4°F | Ht 63.0 in

## 2011-07-03 DIAGNOSIS — J309 Allergic rhinitis, unspecified: Secondary | ICD-10-CM

## 2011-07-03 DIAGNOSIS — R05 Cough: Secondary | ICD-10-CM

## 2011-07-03 MED ORDER — PROMETHAZINE-CODEINE 6.25-10 MG/5ML PO SYRP: 5.0000 mL | ORAL_SOLUTION | ORAL | Status: AC | PRN

## 2011-07-03 MED ORDER — CETIRIZINE HCL 10 MG PO TABS: 10.0000 mg | ORAL_TABLET | Freq: Every day | ORAL | Status: DC

## 2011-07-03 NOTE — Patient Instructions (Signed)
It was good to see you today. If you develop worsening symptoms or fever, call and we can reconsider antibiotics, but it does not appear necessary to use antibiotics at this time. Zyrtec for allergy symptoms and codeine cough syrup at night for cough symptoms - Your prescription(s) have been submitted to your pharmacy. Please take as directed and contact our office if you believe you are having problem(s) with the medication(s). Continue warm salt gargle as needed and use Tylenol or Advil for aches and pains

## 2011-07-03 NOTE — Progress Notes (Signed)
  Subjective:    Patient ID: Sydney Moore, female    DOB: [birth year], 78 y.o.   MRN: [id number]  HPI  complains of cough Onset 2 days ago, wax/wane symptoms  associated with sore throat, mild headache and PND Also myalgias, sinus pressure and mild chest congestion but no sputum No relief with OTC meds Precipitated by sick contacts?  Past Medical History  Diagnosis Date  . Hypertension   . Hyperlipidemia   . MI (myocardial infarction) 1989    Triple Bypass   . Diabetes mellitus   . Chronic cough     Seen by Dr. Sherene Sires  . Hypothyroid   . CAD (coronary artery disease) 1989    s/p CABGx3  . Complete heart block     s/p PPM (MDT) by Dr Deborah Chalk 2004; battery exchange 09/2010  . Carotid artery occlusion 2011    60-80%right/ 40-60 left  . GERD (gastroesophageal reflux disease)   . CVD (cerebrovascular disease)   . CHF (congestive heart failure)     LVEF 35%, LVH    Review of Systems Constitutional: No fever or night sweats, no unexpected weight change Pulmonary: No pleurisy or hemoptysis Cardiovascular: No chest pain or palpitations     Objective:   Physical Exam BP 120/62  Pulse 79  Temp(Src) 97.4 F (36.3 C) (Oral)  Ht 5\' 3"  (1.6 m)  SpO2 97% GEN: nontoxic appearing, no audible chest congestion HENT: NCAT, no sinus tenderness bilaterally, nares with clear discharge, oropharynx mild erythema with clear PND, no exudate Eyes: Vision grossly intact, no conjunctivitis Lungs: Clear to auscultation without rhonchi -no wheeze, no increased work of breathing Cardiovascular: Regular rate and rhythm, no bilateral edema      Assessment & Plan:  allergic rhinitis  Cough, postnasal drip related to above   Explained lack of efficacy for antibiotics in viral disease Prescription cough suppression - new prescriptions done Also zyrtec and symptomatic care with Tylenol or Advil, hydration and rest -  salt gargle advised as needed

## 2011-07-11 ENCOUNTER — Telehealth: Payer: Self-pay

## 2011-07-11 NOTE — Telephone Encounter (Signed)
Per pt's insurance company, Cetirizine 10 mg is not covered because it is available OTC. Per pharmacist, covered alternatives are Accolate and Singulair. Please advise.

## 2011-07-11 NOTE — Telephone Encounter (Signed)
No need for either rx option -please advise pt to obtain from OTC - thanks

## 2011-07-14 NOTE — Telephone Encounter (Signed)
Called pt no answer LMOM md response... 07/14/11@11 :11am/LMB

## 2011-09-29 ENCOUNTER — Other Ambulatory Visit: Payer: Self-pay | Admitting: Physician Assistant

## 2011-10-08 ENCOUNTER — Telehealth: Payer: Self-pay | Admitting: Internal Medicine

## 2011-10-08 NOTE — Telephone Encounter (Signed)
Caller: [name]/Patient; Patient Name: Sydney Moore; PCP: Rene Paci (Adults only); Best Callback Phone Number: [phone].  Pt. calls concerned that her BP is running 110-120 systolic and it usually runs 130's.  Pt. then admits to increased dizziness.  Triaged pt. with Dizziness or Vertigo and emergent symptoms ruled out with exception to:  Dizziness not responding to Home Care. Disposition:  See provider within 24 hours.  Appointment scheduled for 10/10/11 at 10:15 am with Dr. Felicity Coyer.  Home care advice given.  CAN/db

## 2011-10-10 ENCOUNTER — Other Ambulatory Visit (INDEPENDENT_AMBULATORY_CARE_PROVIDER_SITE_OTHER): Payer: Medicare Other

## 2011-10-10 ENCOUNTER — Encounter: Payer: Self-pay | Admitting: Internal Medicine

## 2011-10-10 ENCOUNTER — Ambulatory Visit (INDEPENDENT_AMBULATORY_CARE_PROVIDER_SITE_OTHER): Payer: Medicare Other | Admitting: Internal Medicine

## 2011-10-10 VITALS — BP 142/60 | HR 72 | Temp 97.3°F | Ht 63.0 in | Wt 129.4 lb

## 2011-10-10 DIAGNOSIS — E119 Type 2 diabetes mellitus without complications: Secondary | ICD-10-CM

## 2011-10-10 DIAGNOSIS — I5042 Chronic combined systolic (congestive) and diastolic (congestive) heart failure: Secondary | ICD-10-CM

## 2011-10-10 DIAGNOSIS — I509 Heart failure, unspecified: Secondary | ICD-10-CM

## 2011-10-10 DIAGNOSIS — R42 Dizziness and giddiness: Secondary | ICD-10-CM

## 2011-10-10 DIAGNOSIS — E039 Hypothyroidism, unspecified: Secondary | ICD-10-CM

## 2011-10-10 LAB — CBC WITH DIFFERENTIAL/PLATELET
Basophils Absolute: 0 10*3/uL (ref 0.0–0.1)
Basophils Relative: 0.7 % (ref 0.0–3.0)
Eosinophils Absolute: 0.1 10*3/uL (ref 0.0–0.7)
Eosinophils Relative: 1.1 % (ref 0.0–5.0)
HCT: 38.3 % (ref 36.0–46.0)
Hemoglobin: 12.8 g/dL (ref 12.0–15.0)
Lymphocytes Relative: 27.2 % (ref 12.0–46.0)
Lymphs Abs: 1.7 10*3/uL (ref 0.7–4.0)
MCHC: 33.3 g/dL (ref 30.0–36.0)
MCV: 93.4 fl (ref 78.0–100.0)
Monocytes Absolute: 1 10*3/uL (ref 0.1–1.0)
Monocytes Relative: 15.5 % — ABNORMAL HIGH (ref 3.0–12.0)
Neutro Abs: 3.5 10*3/uL (ref 1.4–7.7)
Neutrophils Relative %: 55.5 % (ref 43.0–77.0)
Platelets: 258 10*3/uL (ref 150.0–400.0)
RBC: 4.1 Mil/uL (ref 3.87–5.11)
RDW: 14.3 % (ref 11.5–14.6)
WBC: 6.4 10*3/uL (ref 4.5–10.5)

## 2011-10-10 LAB — BASIC METABOLIC PANEL
BUN: 15 mg/dL (ref 6–23)
CO2: 28 mEq/L (ref 19–32)
Calcium: 9.3 mg/dL (ref 8.4–10.5)
Chloride: 101 mEq/L (ref 96–112)
Creatinine, Ser: 1 mg/dL (ref 0.4–1.2)
GFR: 70.03 mL/min (ref >60.00)
Glucose, Bld: 97 mg/dL (ref 70–99)
Potassium: 4.1 mEq/L (ref 3.5–5.1)
Sodium: 136 mEq/L (ref 135–145)

## 2011-10-10 LAB — HEMOGLOBIN A1C: Hgb A1c MFr Bld: 6.4 % (ref 4.6–6.5)

## 2011-10-10 LAB — TSH: TSH: 2.14 u[IU]/mL (ref 0.35–5.50)

## 2011-10-10 MED ORDER — FUROSEMIDE 40 MG PO TABS: 40.0000 mg | ORAL_TABLET | Freq: Every day | ORAL | Status: DC

## 2011-10-10 MED ORDER — POTASSIUM CHLORIDE CRYS ER 20 MEQ PO TBCR: 20.0000 meq | EXTENDED_RELEASE_TABLET | Freq: Every day | ORAL | Status: DC

## 2011-10-10 NOTE — Progress Notes (Signed)
  Subjective:    Patient ID: Sydney Moore, female    DOB: May 23, 1923, 78 y.o.   MRN: [id number]  HPI   complains of dizziness Onset 3 weeks ago Intermittent symptoms of light head feeling associated with increase blood pressure and cbg 70s Denies fever, headache    Past Medical History  Diagnosis Date  . Hypertension   . Hyperlipidemia   . MI (myocardial infarction) 1989    Triple Bypass   . Diabetes mellitus   . Chronic cough     Seen by Dr. Sherene Sires  . Hypothyroid   . CAD (coronary artery disease) 1989    s/p CABGx3  . Complete heart block     s/p PPM (MDT) by Dr Deborah Chalk 2004; battery exchange 09/2010  . Carotid artery occlusion 2011    60-80%right/ 40-60 left  . GERD (gastroesophageal reflux disease)   . CVD (cerebrovascular disease)   . CHF (congestive heart failure)     LVEF 35%, LVH    Review of Systems  Constitutional: Negative for fever, fatigue and unexpected weight change.  Eyes: Negative for visual disturbance.  Respiratory: Negative for cough and shortness of breath.   Cardiovascular: Negative for chest pain, palpitations and leg swelling.       Objective:   Physical Exam  BP 142/60  Pulse 72  Temp 97.3 F (36.3 C) (Oral)  Ht 5\' 3"  (1.6 m)  Wt 129 lb 6.4 oz (58.695 kg)  BMI 22.92 kg/m2  SpO2 97% Wt Readings from Last 3 Encounters:  10/10/11 129 lb 6.4 oz (58.695 kg)  07/01/11 131 lb 1.9 oz (59.476 kg)  04/28/11 130 lb (58.968 kg)   Constitutional: She appears well-developed and well-nourished. No distress.  HENT: Head: Normocephalic and atraumatic. Ears: B TMs ok, no erythema or effusion; Nose: Nose normal.  Mouth/Throat: Oropharynx is clear and moist. No oropharyngeal exudate.  Eyes: Conjunctivae and EOM are normal. Pupils are equal, round, and reactive to light. No scleral icterus.  Neck: Normal range of motion. Neck supple. No JVD present. No thyromegaly present.  Cardiovascular: Normal rate, regular rhythm and normal heart sounds.  No murmur  heard. No BLE edema. Pulmonary/Chest: Effort normal and breath sounds normal. No respiratory distress. She has no wheezes. Neurological: She is alert and oriented to person, place, and time. No cranial nerve deficit. Coordination normal. Psychiatric: She has a normal mood and affect. Her behavior is normal. Judgment and thought content normal.    Lab Results  Component Value Date   WBC 4.1* 11/05/2010   HGB 11.9* 11/05/2010   HCT 35.8* 11/05/2010   PLT 223.0 11/05/2010   GLUCOSE 84 04/22/2011   CHOL 126 09/12/2010   TRIG 105 09/12/2010   HDL 49 09/12/2010   LDLDIRECT 58 12/18/2008   LDLCALC 56 09/12/2010   ALT 22 03/08/2010   AST 30 03/08/2010   NA 138 04/22/2011   K 3.6 04/22/2011   CL 100 04/22/2011   CREATININE 0.8 04/22/2011   BUN 11 04/22/2011   CO2 31 04/22/2011   TSH 2.59 04/22/2011   INR 1.06 11/12/2010   HGBA1C 6.4 04/22/2011       Assessment & Plan:   Dizziness - benign exam and non specific hx Check screening labs and reduce lasix dose

## 2011-10-10 NOTE — Patient Instructions (Addendum)
It was good to see you today. We have reviewed your interval records including recent labs and tests today Medications reviewed, decrease lasix to one pill (40mg ) daily and reduce potassium to one pill ( ) daily Test(s) ordered today. Your results will be called to you after review (48-72hours after test completion). If any changes need to be made, you will be notified at that time. Please schedule followup in 6 months for diabetes mellitus and thyroid check, call sooner if problems. Ok to cancel 10/23/11 visit

## 2011-10-10 NOTE — Assessment & Plan Note (Signed)
Lab Results  Component Value Date   TSH 2.59 04/22/2011   The current medical regimen is effective;  continue present plan and medications.

## 2011-10-10 NOTE — Assessment & Plan Note (Signed)
2d echo 03/2011 with LVEF 40% and grade 1 diastolic dysfx Pt felt improved on increase lasix from 40 to 60mg  qd - but now dizzy (see above) Check labs and reduce back to 40mg  qd follow up cards on same -

## 2011-10-10 NOTE — Assessment & Plan Note (Signed)
Lab Results  Component Value Date   HGBA1C 6.4 04/22/2011   On low dose metformin The current medical regimen is effective;  continue present plan and medications.

## 2011-10-23 ENCOUNTER — Ambulatory Visit: Payer: Medicare Other | Admitting: Internal Medicine

## 2011-10-24 ENCOUNTER — Encounter: Payer: Self-pay | Admitting: Internal Medicine

## 2011-10-24 ENCOUNTER — Ambulatory Visit (INDEPENDENT_AMBULATORY_CARE_PROVIDER_SITE_OTHER): Payer: Medicare Other | Admitting: Internal Medicine

## 2011-10-24 ENCOUNTER — Telehealth: Payer: Self-pay | Admitting: Internal Medicine

## 2011-10-24 VITALS — BP 140/78 | HR 80 | Temp 97.3°F | Resp 16 | Wt 130.0 lb

## 2011-10-24 DIAGNOSIS — L710 Perioral dermatitis: Secondary | ICD-10-CM

## 2011-10-24 DIAGNOSIS — L719 Rosacea, unspecified: Secondary | ICD-10-CM

## 2011-10-24 MED ORDER — METFORMIN HCL 500 MG PO TABS: 250.0000 mg | ORAL_TABLET | Freq: Every day | ORAL | Status: DC

## 2011-10-24 MED ORDER — METRONIDAZOLE 1 % EX GEL: Freq: Every day | CUTANEOUS | Status: DC

## 2011-10-24 MED ORDER — ATORVASTATIN CALCIUM 20 MG PO TABS: 20.0000 mg | ORAL_TABLET | Freq: Every day | ORAL | Status: DC

## 2011-10-24 NOTE — Telephone Encounter (Signed)
Caller: [name]/Patient; Patient Name: Sydney Moore; PCP: Rene Paci (Adults only); Best Callback Phone Number: [phone]; Reason for call: She is calling about her face itching below her cheek bones down onto neck.  Onset two weeks or longer 10/10/11.  Only on her face/neck area. She has not changed her normal skin routine of washing her face with [place] soap and putting  vaseline on it.  She states she tried a " cleansing cloth from [employer] make up removal towelette" but had been itching slightly prior to this but it is now worse. Face is not swollen, she feels a few bumps but when she looks in the mirror she does not see anything. Afebrile.  Treated at home with her normal [place] soap, vaeline and baby oil. Keeping her awake at night. No other new exposure. Emergent s/sx ruled out per Rash Protocol with the exception of "Persistant itching that interferes with activities or sleep". See Provdier in 24 hours. Appointment scheduled 10/24/11  today at 1:45 with Dr. Felicity Coyer MD. Home care reviewed understanding expressed. Advised to call back for questions, changes or concerns

## 2011-10-24 NOTE — Progress Notes (Signed)
  Subjective:    Patient ID: Sydney Moore, female    DOB: 07-04-1923, 78 y.o.   MRN: [id number]  HPI  Complains of rash around face, especially chin Unimproved with use of moisturizers and anti-itch creams Intermittent symptoms, especially for past week and worse in past 3 days Denies new medications, pets, lotions, soaps or other topical agents  Also requests refill on metformin and Lipitor  Review of Systems  Constitutional: Negative for fever.  HENT: Negative for sinus pressure.   Skin: Negative for color change.       Objective:   Physical Exam BP 140/78  Pulse 80  Temp 97.3 F (36.3 C) (Oral)  Resp 16  Wt 130 lb (58.968 kg) Gen. no acute distress Skin: Perioral dermatitis with multiple 1 to 2 mm clustered erythematous papules on chin, lips and mucosa spared      Assessment & Plan:  Perioral dermatitis -education on diagnosis provided today Advised to avoid use of steroids on affected facial skin as this will exacerbate condition MetroGel daily as needed - new prescription provided

## 2011-10-24 NOTE — Patient Instructions (Signed)
It was good to see you today. Use MetroGel to affected skin once daily as needed - Your prescription(s) have been submitted to your pharmacy. Please take as directed and contact our office if you believe you are having problem(s) with the medication(s). Other medications reviewed and updated, refills as requested

## 2011-10-28 ENCOUNTER — Ambulatory Visit (INDEPENDENT_AMBULATORY_CARE_PROVIDER_SITE_OTHER): Payer: Medicare Other | Admitting: Cardiovascular Disease

## 2011-10-28 ENCOUNTER — Encounter: Payer: Self-pay | Admitting: Cardiovascular Disease

## 2011-10-28 VITALS — BP 131/67 | HR 67 | Ht 61.0 in | Wt 130.0 lb

## 2011-10-28 DIAGNOSIS — I5042 Chronic combined systolic (congestive) and diastolic (congestive) heart failure: Secondary | ICD-10-CM

## 2011-10-28 DIAGNOSIS — I509 Heart failure, unspecified: Secondary | ICD-10-CM

## 2011-10-28 DIAGNOSIS — I2510 Atherosclerotic heart disease of native coronary artery without angina pectoris: Secondary | ICD-10-CM

## 2011-10-28 MED ORDER — LOSARTAN POTASSIUM 50 MG PO TABS: 50.0000 mg | ORAL_TABLET | Freq: Every day | ORAL | Status: DC

## 2011-10-28 MED ORDER — AMLODIPINE BESYLATE 5 MG PO TABS: 5.0000 mg | ORAL_TABLET | Freq: Every day | ORAL | Status: DC

## 2011-10-28 NOTE — Progress Notes (Signed)
Sydney Moore Date of Birth  [birth year] [facility] [address]     [address] [place], [place]  [address] [phone]  Fax  [phone]   Problems: 1. CAD - s/p CABG 2. Pacer - for advanced AV block 3. PVD- left carotid bruit 4. CHF - EF of 40% 5. Diabetes Mellitus 6. Hypothyroidism   History of Present Illness:  78 year old female with a history of coronary artery disease and pacemaker implant.  She has a history of peripheral vascular disease and has a known left carotid bruit which we are following.   She's had a history of coronary artery bypass grafting. She denies any shortness of breath or chest pain. She does complain of some leg weakness particularly with walking.  Check pacer generator changed in October. She has developed some keloid formation over the pacer incision  A recent echocardiogram reveals mild to moderate left ventricular dysfunction with an ejection fraction of 40%.  There is moderate mitral regurgitation and moderate tricuspid regurgitation. Right ventricle appears to be dilated. Her pulmonary pressures are at the upper limits of normal at 36 mmHg. Her Lasix dose was increased by Dr. Ladona Ridgel and she's feeling a bit better.  Septeber 17, 2013 - she has done well.  She has occasional episodes of CP when walking - goes away very quickly with a SL NTG.  These episodes of chest pain are very rare. She's able to do most of her normal activities without any significant problems. She has known stenosis in her ramus intermediate branch that was not amenable to PCI. We were not able to put a wire down that vessel. At that point she was started on Ranexa and has done well since that time.     Current Outpatient Prescriptions on File Prior to Visit  Medication Sig Dispense Refill  . amLODipine (NORVASC) 10 MG tablet Take 1 tablet (10 mg total) by mouth daily.  30 tablet  5  . aspirin 81 MG tablet Take 81 mg by mouth every  morning.        Marland Kitchen atorvastatin (LIPITOR) 20 MG tablet Take 1 tablet (20 mg total) by mouth daily.  30 tablet  11  . calcium-vitamin D (OSCAL WITH D) 500-200 MG-UNIT per tablet Take 1 tablet by mouth daily.        . cetirizine (ZYRTEC) 10 MG tablet Take 1 tablet (10 mg total) by mouth daily.  30 tablet  11  . clobetasol cream (TEMOVATE) 0.05 % Apply to affected on skin twice a day as needed for rash  30 g  1  . clopidogrel (PLAVIX) 75 MG tablet Take 75 mg by mouth daily.       Marland Kitchen esomeprazole (NEXIUM) 40 MG capsule Take 40 mg by mouth daily.      . furosemide (LASIX) 40 MG tablet Take 1 tablet (40 mg total) by mouth daily.  30 tablet  11  . isosorbide mononitrate (IMDUR) 60 MG 24 hr tablet Take 60 mg by mouth daily.       Marland Kitchen levothyroxine (SYNTHROID, LEVOTHROID) 50 MCG tablet Take 50 mcg by mouth daily.        . metFORMIN (GLUCOPHAGE) 500 MG tablet Take 0.5 tablets (250 mg total) by mouth daily.  30 tablet  11  . metoprolol succinate (TOPROL-XL) 50 MG 24 hr tablet Take 50 mg by mouth daily. Take with or immediately following a meal.      . metroNIDAZOLE (METROGEL) 1 % gel  Apply topically daily.  45 g  0  . nitroGLYCERIN (NITROSTAT) 0.4 MG SL tablet Place 1 tablet (0.4 mg total) under the tongue every 5 (five) minutes as needed for chest pain.  90 tablet  3  . potassium chloride SA (KLOR-CON M20) 20 MEQ tablet Take 1 tablet (20 mEq total) by mouth daily.  30 tablet  11  . ranolazine (RANEXA) 500 MG 12 hr tablet Take 500 mg by mouth 2 (two) times daily.         No Known Allergies  Past Medical History  Diagnosis Date  . Hypertension   . Hyperlipidemia   . MI (myocardial infarction) 1989    Triple Bypass   . Diabetes mellitus   . Chronic cough     Seen by Dr. Sherene Sires  . Hypothyroid   . CAD (coronary artery disease) 1989    s/p CABGx3  . Complete heart block     s/p PPM (MDT) by Dr Deborah Chalk 2004; battery exchange 09/2010  . Carotid artery occlusion 2011    60-80%right/ 40-60 left  . GERD  (gastroesophageal reflux disease)   . CVD (cerebrovascular disease)   . CHF (congestive heart failure)     LVEF 35%, LVH    Past Surgical History  Procedure Date  . Coronary artery bypass graft 1989  . Pacemaker placement 2004    MDT by Dr Deborah Chalk  . Cataract extraction 2004    Both  . Cardiac catheterization 2004, 2007, 2009    History  Smoking status  . Former Smoker  . Quit date: 12/15/1973  Smokeless tobacco  . Never Used    History  Alcohol Use No    Family History  Problem Relation Age of Onset  . Hypertension Mother   . Liver cancer Brother   . Breast cancer Daughter   . Colon cancer Brother   . Cancer Brother     colon    Reviw of Systems:  Reviewed in the HPI.  All other systems are negative.  Physical Exam: BP 131/67  Pulse 67  Ht 5\' 1"  (1.549 m)  Wt 130 lb (58.968 kg)  BMI 24.56 kg/m2 The patient is alert and oriented x 3.  The mood and affect are normal.   Skin: warm and dry.  Color is normal.    HEENT:   the sclera are nonicteric.  The mucous membranes are moist.  The carotids are 2+ with a left carotid bruit.  There is no thyromegaly.  Pulsatile next is inferior to her carotid arteries that are either do to a large V wave or perhaps do to her common carotid artery.  Lungs: clear.  The chest wall is non tender.    Heart: regular rate with a normal S1 and S2.  There are no murmurs, gallops, or rubs. The PMI is not displaced.  Her pacer is in the right subclavian and has a small amount of keloid.   Abdomen: good bowel sounds.  There is no guarding or rebound.  There is no hepatosplenomegaly or tenderness.  There are no masses.   Extremities:  no clubbing, cyanosis, or edema.  The legs are without rashes.  The distal pulses are diminished  Neuro:  Cranial nerves II - XII are intact.  Motor and sensory functions are intact.    The gait is normal.  ECG:   Sept. 17, 2013 - AV sequential pacing at 81  Assessment / Plan:

## 2011-10-28 NOTE — Patient Instructions (Addendum)
Your physician wants you to follow-up in: 6 months You will receive a reminder letter in the mail two months in advance. If you don't receive a letter, please call our office to schedule the follow-up appointment.   ekg next office visit  Your physician recommends that you return for lab work in: 1 month   Your physician has recommended you make the following change in your medication:   Start losartan 50 mg daily Decrease amlodipine to 5 mg daily.

## 2011-10-28 NOTE — Assessment & Plan Note (Addendum)
She's not having a current issues with congestive heart failure. She had previously been on an ACE inhibitor for many years. She apparently had developed a cough and the ACE inhibitor was stopped by Dr. Sherene Sires.  She was never tried on an ARB as far as I can tell. We'll start her on losartan 50 mg a day. We'll decrease her amlodipine to 5 mg a day.  We'll check a basic profile in about a month. I'll see her again in 6 months for followup visit.

## 2011-10-28 NOTE — Assessment & Plan Note (Signed)
Mrs. Lakey is doing well. We'll continue with her same medications. She's having some episodes of angina but overall these are fairly well controlled. She is known native coronary artery disease (intermediate vessel. We attempted PCI in the past but were not able to pass a wire down to through stenosis.  We'll continue with medical therapy for now. I'll see her back in 6 months. We'll check fasting lipids at that time.

## 2011-10-29 ENCOUNTER — Encounter: Payer: Self-pay | Admitting: *Deleted

## 2011-10-29 DIAGNOSIS — Z950 Presence of cardiac pacemaker: Secondary | ICD-10-CM | POA: Insufficient documentation

## 2011-10-31 ENCOUNTER — Encounter: Payer: Self-pay | Admitting: Cardiovascular Disease

## 2011-11-13 ENCOUNTER — Ambulatory Visit (INDEPENDENT_AMBULATORY_CARE_PROVIDER_SITE_OTHER): Payer: Medicare Other | Admitting: Internal Medicine

## 2011-11-13 ENCOUNTER — Encounter: Payer: Self-pay | Admitting: Internal Medicine

## 2011-11-13 VITALS — BP 141/73 | HR 82 | Ht 61.0 in | Wt 132.4 lb

## 2011-11-13 DIAGNOSIS — I2510 Atherosclerotic heart disease of native coronary artery without angina pectoris: Secondary | ICD-10-CM

## 2011-11-13 DIAGNOSIS — Z950 Presence of cardiac pacemaker: Secondary | ICD-10-CM

## 2011-11-13 DIAGNOSIS — I10 Essential (primary) hypertension: Secondary | ICD-10-CM

## 2011-11-13 DIAGNOSIS — I442 Atrioventricular block, complete: Secondary | ICD-10-CM

## 2011-11-13 LAB — PACEMAKER DEVICE OBSERVATION
AL IMPEDENCE PM: 467 Ohm
ATRIAL PACING PM: 93
BATTERY VOLTAGE: 2.8 V
RV LEAD IMPEDENCE PM: 684 Ohm
VENTRICULAR PACING PM: 99.9

## 2011-11-13 NOTE — Progress Notes (Signed)
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 female who presents today for routine electrophysiology followup.  Since last being seen in our clinic, the patient reports doing very well.  She has stable canadian class II angina for which she is followed closely by Dr Elease Hashimoto.  Today, she denies symptoms of palpitations, shortness of breath,  lower extremity edema, dizziness, presyncope, or syncope.  The patient is otherwise without complaint today.   Past Medical History  Diagnosis Date  . Hypertension   . Hyperlipidemia   . MI (myocardial infarction) 1989    Triple Bypass   . Diabetes mellitus   . Chronic cough     Seen by Dr. Sherene Sires  . Hypothyroid   . CAD (coronary artery disease) 1989    s/p CABGx3  . Complete heart block     s/p PPM (MDT) by Dr Deborah Chalk 2004; battery exchange 09/2010  . Carotid artery occlusion 2011    60-80%right/ 40-60 left  . GERD (gastroesophageal reflux disease)   . CVD (cerebrovascular disease)   . CHF (congestive heart failure)     LVEF 35%, LVH   Past Surgical History  Procedure Date  . Coronary artery bypass graft 1989  . Pacemaker placement 2004    MDT by Dr Deborah Chalk  . Cataract extraction 2004    Both  . Cardiac catheterization 2004, 2007, 2009    Current Outpatient Prescriptions  Medication Sig Dispense Refill  . amLODipine (NORVASC) 5 MG tablet Take 1 tablet (5 mg total) by mouth daily.  30 tablet  5  . aspirin 81 MG tablet Take 81 mg by mouth every morning.        Marland Kitchen atorvastatin (LIPITOR) 20 MG tablet Take 1 tablet (20 mg total) by mouth daily.  30 tablet  11  . calcium-vitamin D (OSCAL WITH D) 500-200 MG-UNIT per tablet Take 1 tablet by mouth daily.        . cetirizine (ZYRTEC) 10 MG tablet Take 10 mg by mouth as needed.      . clobetasol cream (TEMOVATE) 0.05 % Apply 1 application topically as needed. Apply to affected on skin twice a day as needed for rash      . clopidogrel (PLAVIX) 75 MG tablet  Take 75 mg by mouth daily.       Marland Kitchen esomeprazole (NEXIUM) 40 MG capsule Take 40 mg by mouth daily.      . furosemide (LASIX) 40 MG tablet Take 1 tablet (40 mg total) by mouth daily.  30 tablet  11  . isosorbide mononitrate (IMDUR) 60 MG 24 hr tablet Take 60 mg by mouth daily.       Marland Kitchen levothyroxine (SYNTHROID, LEVOTHROID) 50 MCG tablet Take 50 mcg by mouth daily.        Marland Kitchen losartan (COZAAR) 50 MG tablet Take 1 tablet (50 mg total) by mouth daily.  30 tablet  6  . metFORMIN (GLUCOPHAGE) 500 MG tablet Take 0.5 tablets (250 mg total) by mouth daily.  30 tablet  11  . metoprolol succinate (TOPROL-XL) 50 MG 24 hr tablet Take 50 mg by mouth daily. Take with or immediately following a meal.      . metroNIDAZOLE (METROGEL) 1 % gel Apply topically daily.  45 g  0  . nitroGLYCERIN (NITROSTAT) 0.4 MG SL tablet Place 1 tablet (0.4 mg total) under the tongue every 5 (five) minutes as needed for chest pain.  90 tablet  3  .  potassium chloride SA (KLOR-CON M20) 20 MEQ tablet Take 1 tablet (20 mEq total) by mouth daily.  30 tablet  11  . ranolazine (RANEXA) 500 MG 12 hr tablet Take 500 mg by mouth 2 (two) times daily.       Marland Kitchen DISCONTD: cetirizine (ZYRTEC) 10 MG tablet Take 1 tablet (10 mg total) by mouth daily.  30 tablet  11  . DISCONTD: clobetasol cream (TEMOVATE) 0.05 % Apply to affected on skin twice a day as needed for rash  30 g  1    Physical Exam: Filed Vitals:   11/13/11 0912  BP: 141/73  Pulse: 82  Height: 5\' 1"  (1.549 m)  Weight: 132 lb 6.4 oz (60.056 kg)    GEN- The patient is well appearing, alert and oriented x 3 today.   Head- normocephalic, atraumatic Eyes-  Sclera clear, conjunctiva pink Ears- hearing intact Oropharynx- clear Lungs- Clear to ausculation bilaterally, normal work of breathing Chest- pacemaker pocket is well healed Heart- Regular rate and rhythm, no murmurs, rubs or gallops, PMI not laterally displaced GI- soft, NT, ND, + BS Extremities- no clubbing, cyanosis, or  edema  Pacemaker interrogation- reviewed in detail today,  See PACEART report  Assessment and Plan:  Atrioventricular block, complete  Normal pacemaker function See Pace Art report No changes today  HYPERTENSION, BENIGN ESSENTIAL   Above goal today, though she reports good BP control at home No changes today   ATHEROSCLEROSIS, CORONARY, BYPASS GRAFT NOS   Stable  No change required today

## 2011-11-13 NOTE — Patient Instructions (Addendum)
Remote monitoring is used to monitor your Pacemaker of ICD from home. This monitoring reduces the number of office visits required to check your device to one time per year. It allows us to keep an eye on the functioning of your device to ensure it is working properly. You are scheduled for a device check from home on February 16, 2011. You may send your transmission at any time that day. If you have a wireless device, the transmission will be sent automatically. After your physician reviews your transmission, you will receive a postcard with your next transmission date.  Your physician wants you to follow-up in: 1 year with Dr Allred. You will receive a reminder letter in the mail two months in advance. If you don't receive a letter, please call our office to schedule the follow-up appointment.  

## 2011-11-27 ENCOUNTER — Other Ambulatory Visit (INDEPENDENT_AMBULATORY_CARE_PROVIDER_SITE_OTHER): Payer: Medicare Other

## 2011-11-27 DIAGNOSIS — I5042 Chronic combined systolic (congestive) and diastolic (congestive) heart failure: Secondary | ICD-10-CM

## 2011-11-27 DIAGNOSIS — I509 Heart failure, unspecified: Secondary | ICD-10-CM

## 2011-11-27 LAB — BASIC METABOLIC PANEL
BUN: 18 mg/dL (ref 6–23)
Chloride: 103 mEq/L (ref 96–112)
Glucose, Bld: 87 mg/dL (ref 70–99)
Potassium: 3.9 mEq/L (ref 3.5–5.1)

## 2011-12-02 ENCOUNTER — Encounter: Payer: Self-pay | Admitting: Neurosurgery

## 2011-12-03 ENCOUNTER — Ambulatory Visit (INDEPENDENT_AMBULATORY_CARE_PROVIDER_SITE_OTHER): Payer: Medicare Other | Admitting: Vascular Surgery

## 2011-12-03 ENCOUNTER — Ambulatory Visit (INDEPENDENT_AMBULATORY_CARE_PROVIDER_SITE_OTHER): Payer: Medicare Other | Admitting: Neurosurgery

## 2011-12-03 ENCOUNTER — Encounter: Payer: Self-pay | Admitting: Neurosurgery

## 2011-12-03 VITALS — BP 132/69 | HR 73 | Resp 16 | Ht 61.0 in | Wt 129.7 lb

## 2011-12-03 DIAGNOSIS — I6529 Occlusion and stenosis of unspecified carotid artery: Secondary | ICD-10-CM

## 2011-12-03 NOTE — Progress Notes (Signed)
[facility] Office Note  CC: Moore surveillance Referring Physician: Brabham  History of Present Illness: 78 year old female patient of Dr. Myra Gianotti with no history of Moore intervention. The patient denies signs or symptoms of CVA, TIA, amaurosis fugax or any neural deficit. The patient denies any new medical diagnoses or recent surgery.  Past Medical History  Diagnosis Date  . Hypertension   . Hyperlipidemia   . MI (myocardial infarction) 1989    Triple Bypass   . Diabetes mellitus   . Chronic cough     Seen by Dr. Sherene Sires  . Hypothyroid   . CAD (coronary artery disease) 1989    s/p CABGx3  . Complete heart block     s/p PPM (MDT) by Dr Deborah Chalk 2004; battery exchange 09/2010  . Moore artery occlusion 2011    60-80%right/ 40-60 left  . GERD (gastroesophageal reflux disease)   . CVD (cerebrovascular disease)   . CHF (congestive heart failure)     LVEF 35%, LVH    ROS: [x]  Positive   [ ]  Denies    General: [ ]  Weight loss, [ ]  Fever, [ ]  chills Neurologic: [ ]  Dizziness, [ ]  Blackouts, [ ]  Seizure [ ]  Stroke, [ ]  "Mini stroke", [ ]  Slurred speech, [ ]  Temporary blindness; [ ]  weakness in arms or legs, [ ]  Hoarseness Cardiac: [ ]  Chest pain/pressure, [ ]  Shortness of breath at rest [ ]  Shortness of breath with exertion, [ ]  Atrial fibrillation or irregular heartbeat Vascular: [ ]  Pain in legs with walking, [ ]  Pain in legs at rest, [ ]  Pain in legs at night,  [ ]  Non-healing ulcer, [ ]  Blood clot in vein/DVT,   Pulmonary: [ ]  Home oxygen, [ ]  Productive cough, [ ]  Coughing up blood, [ ]  Asthma,  [ ]  Wheezing Musculoskeletal:  [ ]  Arthritis, [ ]  Low back pain, [ ]  Joint pain Hematologic: [ ]  Easy Bruising, [ ]  Anemia; [ ]  Hepatitis Gastrointestinal: [ ]  Blood in stool, [ ]  Gastroesophageal Reflux/heartburn, [ ]  Trouble swallowing Urinary: [ ]  chronic Kidney disease, [ ]  on HD - [ ]  MWF or [ ]  TTHS, [ ]  Burning with urination, [ ]   Difficulty urinating Skin: [ ]  Rashes, [ ]  Wounds Psychological: [ ]  Anxiety, [ ]  Depression   Social History History  Substance Use Topics  . Smoking status: Former Smoker    Quit date: 12/15/1973  . Smokeless tobacco: Never Used  . Alcohol Use: No    Family History Family History  Problem Relation Age of Onset  . Hypertension Mother   . Liver cancer Brother   . Breast cancer Daughter   . Colon cancer Brother   . Cancer Brother     colon    No Known Allergies  Current Outpatient Prescriptions  Medication Sig Dispense Refill  . amLODipine (NORVASC) 5 MG tablet Take 1 tablet (5 mg total) by mouth daily.  30 tablet  5  . aspirin 81 MG tablet Take 81 mg by mouth every morning.        Marland Kitchen atorvastatin (LIPITOR) 20 MG tablet Take 1 tablet (20 mg total) by mouth daily.  30 tablet  11  . calcium-vitamin D (OSCAL WITH D) 500-200 MG-UNIT per tablet Take 1 tablet by mouth daily.        . cetirizine (ZYRTEC) 10 MG tablet Take 10 mg by mouth as needed.      . clobetasol cream (TEMOVATE) 0.05 % Apply  1 application topically as needed. Apply to affected on skin twice a day as needed for rash      . clopidogrel (PLAVIX) 75 MG tablet Take 75 mg by mouth daily.       Marland Kitchen esomeprazole (NEXIUM) 40 MG capsule Take 40 mg by mouth daily.      . furosemide (LASIX) 40 MG tablet Take 1 tablet (40 mg total) by mouth daily.  30 tablet  11  . isosorbide mononitrate (IMDUR) 60 MG 24 hr tablet Take 60 mg by mouth daily.       Marland Kitchen levothyroxine (SYNTHROID, LEVOTHROID) 50 MCG tablet Take 50 mcg by mouth daily.        Marland Kitchen losartan (COZAAR) 50 MG tablet Take 1 tablet (50 mg total) by mouth daily.  30 tablet  6  . metFORMIN (GLUCOPHAGE) 500 MG tablet Take 0.5 tablets (250 mg total) by mouth daily.  30 tablet  11  . metoprolol succinate (TOPROL-XL) 50 MG 24 hr tablet Take 50 mg by mouth daily. Take with or immediately following a meal.      . metroNIDAZOLE (METROGEL) 1 % gel Apply topically daily.  45 g  0  .  nitroGLYCERIN (NITROSTAT) 0.4 MG SL tablet Place 1 tablet (0.4 mg total) under the tongue every 5 (five) minutes as needed for chest pain.  90 tablet  3  . potassium chloride SA (KLOR-CON M20) 20 MEQ tablet Take 1 tablet (20 mEq total) by mouth daily.  30 tablet  11  . ranolazine (RANEXA) 500 MG 12 hr tablet Take 500 mg by mouth 2 (two) times daily.         Physical Examination  There were no vitals filed for this visit.  There is no height or weight on file to calculate BMI.  General:  WDWN in NAD Gait: Normal HEENT: WNL Eyes: Pupils equal Pulmonary: normal non-labored breathing , without Rales, rhonchi,  wheezing Cardiac: RRR, without  Murmurs, rubs or gallops; Abdomen: soft, NT, no masses Skin: no rashes, ulcers noted  Vascular Exam Pulses: 2+ radial pulses bilaterally Moore bruits: Moore pulses to auscultation no bruits are heard Extremities without ischemic changes, no Gangrene , no cellulitis; no open wounds;  Musculoskeletal: no muscle wasting or atrophy   Neurologic: A&O X 3; Appropriate Affect ; SENSATION: normal; MOTOR FUNCTION:  moving all extremities equally. Speech is fluent/normal  Non-Invasive Vascular Imaging Moore DUPLEX 12/03/2011  Right ICA 60 - 79 % stenosis Left ICA 40 - 59 % stenosis   ASSESSMENT/PLAN: Asymptomatic patient with an increase bilaterally in her Moore stenosis from previous visit. The patient knows the signs and symptoms of CVA and knows to report to the nearest emergency department should that occur. The patient will followup in 6 months with repeat Moore duplex. The patient's questions were encouraged and answered, she is in agreement with this plan.  Lauree Chandler ANP   Clinic MD: Edilia Bo

## 2011-12-04 ENCOUNTER — Other Ambulatory Visit: Payer: Self-pay | Admitting: *Deleted

## 2011-12-04 DIAGNOSIS — I6529 Occlusion and stenosis of unspecified carotid artery: Secondary | ICD-10-CM

## 2011-12-05 ENCOUNTER — Other Ambulatory Visit: Payer: Self-pay | Admitting: Internal Medicine

## 2011-12-08 ENCOUNTER — Other Ambulatory Visit: Payer: Self-pay | Admitting: Internal Medicine

## 2011-12-18 ENCOUNTER — Encounter (INDEPENDENT_AMBULATORY_CARE_PROVIDER_SITE_OTHER): Payer: Medicare Other

## 2011-12-18 DIAGNOSIS — I70219 Atherosclerosis of native arteries of extremities with intermittent claudication, unspecified extremity: Secondary | ICD-10-CM

## 2011-12-18 DIAGNOSIS — I739 Peripheral vascular disease, unspecified: Secondary | ICD-10-CM

## 2011-12-23 ENCOUNTER — Ambulatory Visit (INDEPENDENT_AMBULATORY_CARE_PROVIDER_SITE_OTHER): Payer: Medicare Other | Admitting: Cardiovascular Disease

## 2011-12-23 ENCOUNTER — Encounter: Payer: Self-pay | Admitting: Cardiovascular Disease

## 2011-12-23 VITALS — BP 116/66 | HR 95 | Ht 61.0 in | Wt 130.8 lb

## 2011-12-23 DIAGNOSIS — I2510 Atherosclerotic heart disease of native coronary artery without angina pectoris: Secondary | ICD-10-CM

## 2011-12-23 DIAGNOSIS — I70219 Atherosclerosis of native arteries of extremities with intermittent claudication, unspecified extremity: Secondary | ICD-10-CM

## 2011-12-23 NOTE — Progress Notes (Signed)
HPI:  78 year-old woman presenting for followup evaluation. The patient was initially seen one year ago for lower extremity PAD. Her left ABI has been in the moderate range. When I saw her last year she had minimal claudication symptoms and she was managed conservatively.  Noninvasive imaging demonstrated total occlusion of the SFA with collaterals present.  The patient reports no interval change in her lower extremity symptoms. She has mild leg weakness, but denies pain in either leg. She doesn't do a lot of walking. She has exertional angina and takes nitroglycerin. She reports stable anginal symptoms. She complains of generalized fatigue. She's had no ulcerations or nonhealing wounds.  Outpatient Encounter Prescriptions as of 12/23/2011  Medication Sig Dispense Refill  . amLODipine (NORVASC) 5 MG tablet Take 1 tablet (5 mg total) by mouth daily.  30 tablet  5  . aspirin 81 MG tablet Take 81 mg by mouth every morning.        Marland Kitchen atorvastatin (LIPITOR) 20 MG tablet Take 1 tablet (20 mg total) by mouth daily.  30 tablet  11  . calcium-vitamin D (OSCAL WITH D) 500-200 MG-UNIT per tablet Take 1 tablet by mouth daily.        . cetirizine (ZYRTEC) 10 MG tablet Take 10 mg by mouth as needed.      . clobetasol cream (TEMOVATE) 0.05 % Apply 1 application topically as needed. Apply to affected on skin twice a day as needed for rash      . clopidogrel (PLAVIX) 75 MG tablet Take 75 mg by mouth daily.       Marland Kitchen esomeprazole (NEXIUM) 40 MG capsule Take 40 mg by mouth daily.      . furosemide (LASIX) 40 MG tablet Take 1 tablet (40 mg total) by mouth daily.  30 tablet  11  . isosorbide mononitrate (IMDUR) 60 MG 24 hr tablet Take 60 mg by mouth daily.       Marland Kitchen levothyroxine (SYNTHROID, LEVOTHROID) 50 MCG tablet Take 50 mcg by mouth daily.        Marland Kitchen losartan (COZAAR) 50 MG tablet Take 1 tablet (50 mg total) by mouth daily.  30 tablet  6  . metFORMIN (GLUCOPHAGE) 500 MG tablet Take 0.5 tablets (250 mg total) by mouth  daily.  30 tablet  11  . metoprolol succinate (TOPROL-XL) 50 MG 24 hr tablet Take 50 mg by mouth daily. Take with or immediately following a meal.      . metroNIDAZOLE (METROGEL) 1 % gel Apply topically daily.  45 g  0  . NITROSTAT 0.4 MG SL tablet PLACE 1 TABLET UNDER THE TONGUE EVERY 5 MINUTES AS NEEDED FOR CHEST PAIN  90 each  3  . potassium chloride SA (KLOR-CON M20) 20 MEQ tablet Take 1 tablet (20 mEq total) by mouth daily.  30 tablet  11  . ranolazine (RANEXA) 500 MG 12 hr tablet Take 500 mg by mouth 2 (two) times daily.       . [DISCONTINUED] NITROSTAT 0.4 MG SL tablet PLACE 1 TABLET UNDER THE TONGUE EVERY 5 MINUTES AS NEEDED FOR CHEST PAIN  30 each  1    No Known Allergies  Past Medical History  Diagnosis Date  . Hypertension   . Hyperlipidemia   . MI (myocardial infarction) 1989    Triple Bypass   . Diabetes mellitus   . Chronic cough     Seen by Dr. Sherene Sires  . Hypothyroid   . CAD (coronary artery disease) 1989  s/p CABGx3  . Complete heart block     s/p PPM (MDT) by Dr Deborah Chalk 2004; battery exchange 09/2010  . Carotid artery occlusion 2011    60-80%right/ 40-60 left  . GERD (gastroesophageal reflux disease)   . CVD (cerebrovascular disease)   . CHF (congestive heart failure)     LVEF 35%, LVH    ROS: Negative except as per HPI  BP 116/66  Pulse 95  Ht 5\' 1"  (1.549 m)  Wt 59.33 kg (130 lb 12.8 oz)  BMI 24.71 kg/m2  SpO2 94%  PHYSICAL EXAM: Pt is alert and oriented, pleasant elderly woman in NAD HEENT: normal Neck: JVP - normal, carotids 2+= with bilateral bruits Lungs: CTA bilaterally CV: RRR without murmur or gallop Abd: soft, NT, Positive BS, no hepatomegaly Ext: no C/C/E, DP/PT pulses 2+ on the right and trace on the left Skin: warm/dry no rash  EKG:  AV sequential pacing 86 beats per minute.  ASSESSMENT AND PLAN:  Lower extremity peripheral arterial disease. Recent ABIs were done and it demonstrated a right ABI 0.91 and a left ABI of 0.60. These  values are stable from her previous study one year ago. Her symptoms are not lifestyle limiting. I have recommended continued observation. Her medical therapy is appropriate and she is on an excellent regimen under the care of Dr Elease Hashimoto. She has multiple physicians and I am going to see her back on an as-needed basis. She understands to call if she develops problems with her legs.  Tonny Bollman 12/23/2011 2:59 PM

## 2011-12-23 NOTE — Patient Instructions (Addendum)
Your physician recommends that you schedule a follow-up appointment as need for Peripheral Vascular Disease.   Your physician recommends that you continue on your current medications as directed. Please refer to the Current Medication list given to you today.

## 2012-02-16 ENCOUNTER — Encounter: Payer: Medicare Other | Admitting: *Deleted

## 2012-02-19 ENCOUNTER — Encounter: Payer: Self-pay | Admitting: *Deleted

## 2012-04-09 ENCOUNTER — Ambulatory Visit (INDEPENDENT_AMBULATORY_CARE_PROVIDER_SITE_OTHER): Payer: Medicare Other | Admitting: Internal Medicine

## 2012-04-09 ENCOUNTER — Encounter: Payer: Self-pay | Admitting: Internal Medicine

## 2012-04-09 ENCOUNTER — Other Ambulatory Visit (INDEPENDENT_AMBULATORY_CARE_PROVIDER_SITE_OTHER): Payer: Medicare Other

## 2012-04-09 VITALS — BP 140/60 | HR 73 | Temp 97.0°F | Wt 133.4 lb

## 2012-04-09 DIAGNOSIS — E785 Hyperlipidemia, unspecified: Secondary | ICD-10-CM

## 2012-04-09 DIAGNOSIS — R5381 Other malaise: Secondary | ICD-10-CM

## 2012-04-09 DIAGNOSIS — K219 Gastro-esophageal reflux disease without esophagitis: Secondary | ICD-10-CM | POA: Insufficient documentation

## 2012-04-09 DIAGNOSIS — R5383 Other fatigue: Secondary | ICD-10-CM

## 2012-04-09 DIAGNOSIS — E119 Type 2 diabetes mellitus without complications: Secondary | ICD-10-CM

## 2012-04-09 DIAGNOSIS — E039 Hypothyroidism, unspecified: Secondary | ICD-10-CM

## 2012-04-09 LAB — CBC WITH DIFFERENTIAL/PLATELET
Basophils Relative: 0.8 % (ref 0.0–3.0)
Eosinophils Relative: 1.8 % (ref 0.0–5.0)
Hemoglobin: 12.4 g/dL (ref 12.0–15.0)
Lymphocytes Relative: 33 % (ref 12.0–46.0)
MCHC: 34.1 g/dL (ref 30.0–36.0)
Monocytes Relative: 12.2 % — ABNORMAL HIGH (ref 3.0–12.0)
Neutro Abs: 2.2 10*3/uL (ref 1.4–7.7)
RBC: 3.94 Mil/uL (ref 3.87–5.11)
WBC: 4.2 10*3/uL — ABNORMAL LOW (ref 4.5–10.5)

## 2012-04-09 LAB — TSH: TSH: 3.37 u[IU]/mL (ref 0.35–5.50)

## 2012-04-09 LAB — LIPID PANEL
HDL: 40.3 mg/dL (ref >39.00)
Total CHOL/HDL Ratio: 3
VLDL: 29.2 mg/dL (ref 0.0–40.0)

## 2012-04-09 LAB — HEPATIC FUNCTION PANEL
AST: 34 U/L (ref 0–37)
Albumin: 4 g/dL (ref 3.5–5.2)

## 2012-04-09 LAB — BASIC METABOLIC PANEL
CO2: 29 mEq/L (ref 19–32)
Calcium: 9.1 mg/dL (ref 8.4–10.5)
Sodium: 140 mEq/L (ref 135–145)

## 2012-04-09 LAB — HEMOGLOBIN A1C: Hgb A1c MFr Bld: 6.3 % (ref 4.6–6.5)

## 2012-04-09 MED ORDER — FAMOTIDINE 20 MG PO TABS: 20.0000 mg | ORAL_TABLET | Freq: Two times a day (BID) | ORAL | Status: DC

## 2012-04-09 NOTE — Assessment & Plan Note (Signed)
On simvastatin, last lipids reviewed Check annually, adjust as needed for LDL less than 70 goal

## 2012-04-09 NOTE — Assessment & Plan Note (Signed)
Lab Results  Component Value Date   TSH 2.14 10/10/2011   The current medical regimen is effective;  continue present plan and medications.

## 2012-04-09 NOTE — Patient Instructions (Signed)
It was good to see you today. We have reviewed your interval records including recent labs and tests today Medications reviewed,stop Nexium and use generic Pepcid as needed for reflux symptoms -no other changes recommended at this time Test(s) ordered today. Your results will be released to MyChart (or called to you) after review, usually within 72hours after test completion. If any changes need to be made, you will be notified at that same time. Please schedule followup in 6 months for diabetes mellitus and thyroid check, call sooner if problems.

## 2012-04-09 NOTE — Assessment & Plan Note (Signed)
Infrequent use of Nexium because of diarrhea side effects Denies regular heartburn symptom Changed PPI to H2 blocker when necessary, new prescription generic Pepcid sent

## 2012-04-09 NOTE — Assessment & Plan Note (Signed)
Lab Results  Component Value Date   HGBA1C 6.4 10/10/2011   On low dose metformin - intermittent use of same because of diarrhea side effects Consider change to Januvia or other class of medication if a1c greater than 7 On statin, aspirin and ARB

## 2012-04-09 NOTE — Progress Notes (Signed)
  Subjective:    Patient ID: Sydney Moore, female    DOB: [birth year], 79 y.o.   MRN: [id number]  HPI  Here for follow up - reviewed chronic medical issues   Past Medical History  Diagnosis Date  . Hypertension   . Hyperlipidemia   . MI (myocardial infarction) 1989    Triple Bypass   . Diabetes mellitus   . Chronic cough     Seen by Dr. Sherene Sires  . Hypothyroid   . CAD (coronary artery disease) 1989    s/p CABGx3  . Complete heart block     s/p PPM (MDT) by Dr Deborah Chalk 2004; battery exchange 09/2010  . Carotid artery occlusion 2011    60-80%right/ 40-60 left  . GERD (gastroesophageal reflux disease)   . CVD (cerebrovascular disease)   . CHF (congestive heart failure)     LVEF 35%, LVH    Review of Systems  Constitutional: Positive for fatigue. Negative for fever and unexpected weight change.  Eyes: Negative for visual disturbance.  Respiratory: Negative for cough and shortness of breath.   Cardiovascular: Negative for chest pain, palpitations and leg swelling.       Objective:   Physical Exam  BP 140/60  Pulse 73  Temp(Src) 97 F (36.1 C) (Oral)  Wt 133 lb 6.4 oz (60.51 kg)  BMI 25.22 kg/m2  SpO2 97% Wt Readings from Last 3 Encounters:  04/09/12 133 lb 6.4 oz (60.51 kg)  12/23/11 130 lb 12.8 oz (59.33 kg)  12/03/11 129 lb 11.2 oz (58.832 kg)   Constitutional: She appears well-developed and well-nourished. No distress.   Neck: Normal range of motion. Neck supple. No JVD present. No thyromegaly present.  Cardiovascular: Normal rate, regular rhythm and normal heart sounds.  No murmur heard. No BLE edema. Pulmonary/Chest: Effort normal and breath sounds normal. No respiratory distress. She has no wheezes. Neurological: She is alert and oriented to person, place, and time. No cranial nerve deficit. Coordination normal. Psychiatric: She has a normal mood and affect. Her behavior is normal. Judgment and thought content normal.  Skin: brittle thin fingernail tips - ?few  fungal changes   Lab Results  Component Value Date   WBC 6.4 10/10/2011   HGB 12.8 10/10/2011   HCT 38.3 10/10/2011   PLT 258.0 10/10/2011   GLUCOSE 87 11/27/2011   CHOL 126 09/12/2010   TRIG 105 09/12/2010   HDL 49 09/12/2010   LDLDIRECT 58 12/18/2008   LDLCALC 56 09/12/2010   ALT 22 03/08/2010   AST 30 03/08/2010   NA 140 11/27/2011   K 3.9 11/27/2011   CL 103 11/27/2011   CREATININE 1.0 11/27/2011   BUN 18 11/27/2011   CO2 30 11/27/2011   TSH 2.14 10/10/2011   INR 1.06 11/12/2010   HGBA1C 6.4 10/10/2011       Assessment & Plan:   See problem list. Medications and labs reviewed today.  Fatigue - nonspecific symptoms/exam - check screening labs

## 2012-04-10 DIAGNOSIS — R079 Chest pain, unspecified: Secondary | ICD-10-CM

## 2012-04-10 HISTORY — DX: Chest pain, unspecified: R07.9

## 2012-04-28 ENCOUNTER — Observation Stay (HOSPITAL_COMMUNITY)
Admission: EM | Admit: 2012-04-28 | Discharge: 2012-04-29 | Disposition: A | Discharge status: Home / Self Care | Payer: Medicare Other | Attending: Cardiovascular Disease | Admitting: Cardiovascular Disease

## 2012-04-28 ENCOUNTER — Encounter (HOSPITAL_COMMUNITY): Payer: Self-pay | Admitting: Emergency Medicine

## 2012-04-28 ENCOUNTER — Telehealth: Payer: Self-pay | Admitting: Cardiovascular Disease

## 2012-04-28 ENCOUNTER — Emergency Department (HOSPITAL_COMMUNITY): Payer: Medicare Other

## 2012-04-28 DIAGNOSIS — I5042 Chronic combined systolic (congestive) and diastolic (congestive) heart failure: Secondary | ICD-10-CM

## 2012-04-28 DIAGNOSIS — M79609 Pain in unspecified limb: Secondary | ICD-10-CM | POA: Insufficient documentation

## 2012-04-28 DIAGNOSIS — R9439 Abnormal result of other cardiovascular function study: Secondary | ICD-10-CM | POA: Insufficient documentation

## 2012-04-28 DIAGNOSIS — I442 Atrioventricular block, complete: Secondary | ICD-10-CM | POA: Diagnosis present

## 2012-04-28 DIAGNOSIS — I5022 Chronic systolic (congestive) heart failure: Secondary | ICD-10-CM | POA: Diagnosis present

## 2012-04-28 DIAGNOSIS — I779 Disorder of arteries and arterioles, unspecified: Secondary | ICD-10-CM | POA: Diagnosis present

## 2012-04-28 DIAGNOSIS — I10 Essential (primary) hypertension: Secondary | ICD-10-CM | POA: Insufficient documentation

## 2012-04-28 DIAGNOSIS — E785 Hyperlipidemia, unspecified: Secondary | ICD-10-CM | POA: Diagnosis present

## 2012-04-28 DIAGNOSIS — I200 Unstable angina: Secondary | ICD-10-CM

## 2012-04-28 DIAGNOSIS — R7309 Other abnormal glucose: Secondary | ICD-10-CM | POA: Insufficient documentation

## 2012-04-28 DIAGNOSIS — R799 Abnormal finding of blood chemistry, unspecified: Secondary | ICD-10-CM | POA: Insufficient documentation

## 2012-04-28 DIAGNOSIS — I700 Atherosclerosis of aorta: Secondary | ICD-10-CM | POA: Insufficient documentation

## 2012-04-28 DIAGNOSIS — I119 Hypertensive heart disease without heart failure: Secondary | ICD-10-CM | POA: Diagnosis present

## 2012-04-28 DIAGNOSIS — I517 Cardiomegaly: Secondary | ICD-10-CM | POA: Insufficient documentation

## 2012-04-28 DIAGNOSIS — I745 Embolism and thrombosis of iliac artery: Secondary | ICD-10-CM | POA: Insufficient documentation

## 2012-04-28 DIAGNOSIS — K219 Gastro-esophageal reflux disease without esophagitis: Secondary | ICD-10-CM | POA: Diagnosis present

## 2012-04-28 DIAGNOSIS — I25810 Atherosclerosis of coronary artery bypass graft(s) without angina pectoris: Secondary | ICD-10-CM | POA: Insufficient documentation

## 2012-04-28 DIAGNOSIS — I708 Atherosclerosis of other arteries: Secondary | ICD-10-CM | POA: Insufficient documentation

## 2012-04-28 DIAGNOSIS — E119 Type 2 diabetes mellitus without complications: Secondary | ICD-10-CM | POA: Diagnosis present

## 2012-04-28 DIAGNOSIS — R079 Chest pain, unspecified: Principal | ICD-10-CM | POA: Insufficient documentation

## 2012-04-28 DIAGNOSIS — E039 Hypothyroidism, unspecified: Secondary | ICD-10-CM | POA: Diagnosis present

## 2012-04-28 DIAGNOSIS — I2510 Atherosclerotic heart disease of native coronary artery without angina pectoris: Secondary | ICD-10-CM

## 2012-04-28 HISTORY — DX: Chronic systolic (congestive) heart failure: I50.22

## 2012-04-28 LAB — COMPREHENSIVE METABOLIC PANEL
ALT: 21 U/L (ref 0–35)
Alkaline Phosphatase: 74 U/L (ref 39–117)
CO2: 31 mEq/L (ref 19–32)
GFR calc Af Amer: 64 mL/min — ABNORMAL LOW (ref >90)
GFR calc non Af Amer: 55 mL/min — ABNORMAL LOW (ref >90)
Glucose, Bld: 111 mg/dL — ABNORMAL HIGH (ref 70–99)
Potassium: 3.8 mEq/L (ref 3.5–5.1)
Sodium: 137 mEq/L (ref 135–145)
Total Bilirubin: 0.5 mg/dL (ref 0.3–1.2)

## 2012-04-28 LAB — URINALYSIS, ROUTINE W REFLEX MICROSCOPIC
Bilirubin Urine: NEGATIVE
Specific Gravity, Urine: 1.01 (ref 1.005–1.030)
pH: 7 (ref 5.0–8.0)

## 2012-04-28 LAB — CBC WITH DIFFERENTIAL/PLATELET
Eosinophils Relative: 1 % (ref 0–5)
Lymphocytes Relative: 32 % (ref 12–46)
Lymphs Abs: 1.5 10*3/uL (ref 0.7–4.0)
MCV: 89.8 fL (ref 78.0–100.0)
Neutrophils Relative %: 52 % (ref 43–77)
Platelets: 213 10*3/uL (ref 150–400)
RBC: 4.1 MIL/uL (ref 3.87–5.11)
WBC: 4.7 10*3/uL (ref 4.0–10.5)

## 2012-04-28 LAB — CBC
MCV: 89.1 fL (ref 78.0–100.0)
Platelets: 196 10*3/uL (ref 150–400)
RBC: 3.86 MIL/uL — ABNORMAL LOW (ref 3.87–5.11)
WBC: 4.6 10*3/uL (ref 4.0–10.5)

## 2012-04-28 LAB — TROPONIN I: Troponin I: <0.3 ng/mL (ref <0.30)

## 2012-04-28 LAB — GLUCOSE, CAPILLARY: Glucose-Capillary: 100 mg/dL — ABNORMAL HIGH (ref 70–99)

## 2012-04-28 LAB — CREATININE, SERUM: GFR calc Af Amer: 63 mL/min — ABNORMAL LOW (ref >90)

## 2012-04-28 MED ORDER — IOHEXOL 350 MG/ML SOLN
100.0000 mL | Freq: Once | INTRAVENOUS | Status: AC | PRN
  Administered 2012-04-28: 100 mL via INTRAVENOUS

## 2012-04-28 MED ORDER — ASPIRIN 81 MG PO TABS
81.0000 mg | ORAL_TABLET | ORAL | Status: DC
  Administered 2012-04-29: 81 mg via ORAL
  Filled 2012-04-28 (×2): qty 1

## 2012-04-28 MED ORDER — INSULIN ASPART 100 UNIT/ML ~~LOC~~ SOLN: 0.0000 [IU] | Freq: Three times a day (TID) | SUBCUTANEOUS | Status: DC

## 2012-04-28 MED ORDER — LEVOTHYROXINE SODIUM 50 MCG PO TABS
50.0000 ug | ORAL_TABLET | Freq: Every day | ORAL | Status: DC
  Administered 2012-04-29: 50 ug via ORAL
  Filled 2012-04-28 (×2): qty 1

## 2012-04-28 MED ORDER — ISOSORBIDE MONONITRATE ER 60 MG PO TB24
60.0000 mg | ORAL_TABLET | Freq: Every day | ORAL | Status: DC
  Administered 2012-04-29: 60 mg via ORAL
  Filled 2012-04-28: qty 1

## 2012-04-28 MED ORDER — ATORVASTATIN CALCIUM 20 MG PO TABS
20.0000 mg | ORAL_TABLET | Freq: Every day | ORAL | Status: DC
  Administered 2012-04-29: 20 mg via ORAL
  Filled 2012-04-28: qty 1

## 2012-04-28 MED ORDER — AMLODIPINE BESYLATE 5 MG PO TABS
5.0000 mg | ORAL_TABLET | Freq: Every day | ORAL | Status: DC
  Administered 2012-04-29: 5 mg via ORAL
  Filled 2012-04-28: qty 1

## 2012-04-28 MED ORDER — HEPARIN SODIUM (PORCINE) 5000 UNIT/ML IJ SOLN
5000.0000 [IU] | Freq: Three times a day (TID) | INTRAMUSCULAR | Status: DC
  Administered 2012-04-28 – 2012-04-29 (×2): 5000 [IU] via SUBCUTANEOUS
  Filled 2012-04-28 (×5): qty 1

## 2012-04-28 MED ORDER — CLOPIDOGREL BISULFATE 75 MG PO TABS
75.0000 mg | ORAL_TABLET | Freq: Every day | ORAL | Status: DC
  Administered 2012-04-29: 75 mg via ORAL
  Filled 2012-04-28: qty 1

## 2012-04-28 MED ORDER — ACETAMINOPHEN 325 MG PO TABS: 650.0000 mg | ORAL_TABLET | ORAL | Status: DC | PRN

## 2012-04-28 MED ORDER — NITROGLYCERIN 0.4 MG SL SUBL: 0.4000 mg | SUBLINGUAL_TABLET | SUBLINGUAL | Status: DC | PRN

## 2012-04-28 MED ORDER — METOPROLOL SUCCINATE ER 50 MG PO TB24
50.0000 mg | ORAL_TABLET | Freq: Every day | ORAL | Status: DC
  Administered 2012-04-29: 50 mg via ORAL
  Filled 2012-04-28: qty 1

## 2012-04-28 MED ORDER — POTASSIUM CHLORIDE CRYS ER 20 MEQ PO TBCR
20.0000 meq | EXTENDED_RELEASE_TABLET | Freq: Every evening | ORAL | Status: DC
  Administered 2012-04-28: 20 meq via ORAL
  Filled 2012-04-28 (×2): qty 1

## 2012-04-28 MED ORDER — CALCIUM CARBONATE-VITAMIN D 500-200 MG-UNIT PO TABS
1.0000 | ORAL_TABLET | Freq: Every day | ORAL | Status: DC
  Administered 2012-04-29: 1 via ORAL
  Filled 2012-04-28: qty 1

## 2012-04-28 MED ORDER — ONDANSETRON HCL 4 MG/2ML IJ SOLN: 4.0000 mg | Freq: Four times a day (QID) | INTRAMUSCULAR | Status: DC | PRN

## 2012-04-28 MED ORDER — RANOLAZINE ER 500 MG PO TB12
500.0000 mg | ORAL_TABLET | Freq: Two times a day (BID) | ORAL | Status: DC
  Administered 2012-04-28 – 2012-04-29 (×2): 500 mg via ORAL
  Filled 2012-04-28 (×3): qty 1

## 2012-04-28 MED ORDER — LOSARTAN POTASSIUM 50 MG PO TABS
50.0000 mg | ORAL_TABLET | Freq: Every day | ORAL | Status: DC
  Filled 2012-04-28: qty 1

## 2012-04-28 MED ORDER — PANTOPRAZOLE SODIUM 40 MG PO TBEC
80.0000 mg | DELAYED_RELEASE_TABLET | Freq: Every day | ORAL | Status: DC
  Administered 2012-04-29: 80 mg via ORAL
  Filled 2012-04-28: qty 2

## 2012-04-28 MED ORDER — FUROSEMIDE 40 MG PO TABS
40.0000 mg | ORAL_TABLET | Freq: Every day | ORAL | Status: DC
  Administered 2012-04-29: 40 mg via ORAL
  Filled 2012-04-28: qty 1

## 2012-04-28 NOTE — ED Notes (Signed)
Pt ambulated to restroom with little difficulty; pt stated she felt shaky but much better than when she first arrived.

## 2012-04-28 NOTE — ED Notes (Signed)
Report called to [place], RN 3W

## 2012-04-28 NOTE — ED Notes (Signed)
Patient states she had been having back and L arm pain x several days.  Patient further advised she has intermittent chest pain, but denies any today.   Patient does have NTG at home and she took some yesterday morning for chest pain with resolve.

## 2012-04-28 NOTE — ED Notes (Signed)
MD made aware of CT results.

## 2012-04-28 NOTE — Telephone Encounter (Signed)
New Problem:    Patient called in wanting to be seen as soon as possible because she is having on and off unusual indescribable chest pain.  Patient states that associated with that she is having a little gas and some left arm pain.  Please call back.

## 2012-04-28 NOTE — ED Provider Notes (Signed)
History     CSN: [id number]  Arrival date & time 04/28/12  1207   First MD Initiated Contact with Patient 04/28/12 1214      Chief Complaint  Patient presents with  . Back Pain  . Arm Pain    L arm pain    (Consider location/radiation/quality/duration/timing/severity/associated sxs/prior treatment) HPI Comments: Patient with intermittent back, left arm and chest pain for the past several days. It comes and goes lasting a few minutes at a time. Happens 2 or 3 times a day. It is relieved by nitroglycerin. She is a history of CAD status post CABG and pacemaker for AV block. Denies any difficulty breathing, cough, fever, chills, abdominal pain, nausea vomiting. She states belching makes the pain better and moving around makes it better. Nothing makes the pain worse. She's not had similar pain previously.  The history is provided by the EMS personnel and the patient.    Past Medical History  Diagnosis Date  . Hypertension   . Hyperlipidemia   . Diabetes mellitus   . Chronic cough     Seen by Dr. Sherene Sires  . Hypothyroid   . CAD (coronary artery disease) 1989    a. 1989: s/p CABGx3;  b. 06/2007 Cath/Attempted PCI: LM min irregs, LAD 90/100, D1 90 (attempted PCI), RI 90, LCX100p, OM1 100, RCA 60/70p, 90/57m, VG->RCA nl, VG->OM 100, LIMA->LAD nl, EF 50%.  . Complete heart block     a. s/p PPM (MDT) by Dr Deborah Chalk 2004; b. Gen change 09/2010 (MDT ADDRL1 Adapta DC PPM Ser #:[id number]  . Carotid artery occlusion 2011    60-79%right/ 40-59% left  . GERD (gastroesophageal reflux disease)   . Chronic systolic CHF (congestive heart failure)     a. 03/2011 Echo: EF 40% inflat HK, Gr1 DD, mod MR/TR    Past Surgical History  Procedure Laterality Date  . Coronary artery bypass graft  1989  . Pacemaker placement  2004    MDT by Dr Deborah Chalk  . Cataract extraction  2004    Both  . Cardiac catheterization  2004, 2007, 2009    Family History  Problem Relation Age of Onset  . Hypertension Mother    . Liver cancer Brother   . Breast cancer Daughter   . Colon cancer Brother   . Cancer Brother     colon    History  Substance Use Topics  . Smoking status: Former Smoker    Quit date: 12/15/1973  . Smokeless tobacco: Never Used  . Alcohol Use: No    OB History   Grav Para Term Preterm Abortions TAB SAB Ect Mult Living                  Review of Systems  Constitutional: Negative for fever, activity change and appetite change.  HENT: Negative for congestion and rhinorrhea.   Respiratory: Negative for cough, chest tightness and shortness of breath.   Cardiovascular: Positive for chest pain.  Gastrointestinal: Negative for nausea, vomiting and abdominal pain.  Genitourinary: Negative for dysuria, vaginal bleeding and vaginal discharge.  Musculoskeletal: Negative for back pain.  Skin: Negative for rash.  Neurological: Negative for dizziness, weakness and headaches.  A complete 10 system review of systems was obtained and all systems are negative except as noted in the HPI and PMH.    Allergies  Review of patient's allergies indicates no known allergies.  Home Medications   No current outpatient prescriptions on file.  BP 173/72  Pulse 62  Temp(Src) 98.2  F (36.8 C) (Oral)  Resp 19  SpO2 99%  Physical Exam  Constitutional: She is oriented to person, place, and time. She appears well-developed and well-nourished. No distress.  HENT:  Head: Normocephalic and atraumatic.  Mouth/Throat: Oropharynx is clear and moist. No oropharyngeal exudate.  Eyes: Conjunctivae and EOM are normal. Pupils are equal, round, and reactive to light.  Neck: Normal range of motion. Neck supple.  Cardiovascular: Normal rate, regular rhythm and normal heart sounds.   No murmur heard. Equal DP, femoral, radial pulses  Pulmonary/Chest: Effort normal and breath sounds normal. No respiratory distress. She exhibits no tenderness.  Abdominal: Soft. There is no tenderness. There is no rebound and  no guarding.  Musculoskeletal: Normal range of motion. She exhibits no edema and no tenderness.  Neurological: She is alert and oriented to person, place, and time. No cranial nerve deficit. She exhibits normal muscle tone. Coordination normal.  Skin: Skin is warm.    ED Course  Procedures (including critical care time)  Labs Reviewed  CBC WITH DIFFERENTIAL - Abnormal; Notable for the following:    Monocytes Relative 15 (*)    All other components within normal limits  COMPREHENSIVE METABOLIC PANEL - Abnormal; Notable for the following:    Glucose, Bld 111 (*)    GFR calc non Af Amer 55 (*)    GFR calc Af Amer 64 (*)    All other components within normal limits  D-DIMER, QUANTITATIVE - Abnormal; Notable for the following:    D-Dimer, Quant 1.08 (*)    All other components within normal limits  URINALYSIS, ROUTINE W REFLEX MICROSCOPIC - Abnormal; Notable for the following:    Hgb urine dipstick SMALL (*)    Leukocytes, UA LARGE (*)    All other components within normal limits  PRO B NATRIURETIC PEPTIDE - Abnormal; Notable for the following:    Pro B Natriuretic peptide (BNP) 665.7 (*)    All other components within normal limits  URINE MICROSCOPIC-ADD ON - Abnormal; Notable for the following:    Squamous Epithelial / LPF FEW (*)    All other components within normal limits  TROPONIN I  PROTIME-INR      1. Unstable angina   2. Chest pain   3. Coronary atherosclerosis of unspecified type of bypass graft       MDM  Several day history of intermittent left axillary pain with chest and back pain. Paced EKG. Pain-free at this time.  Troponin negative PAtient with both typical and atypical features of angina. D/w Cardiology who will admit. D-dimer postive and CT pending at time of admission.    Date: 04/28/2012  Rate: 66  Rhythm: paced  QRS Axis: normal  Intervals: normal  ST/T Wave abnormalities: normal  Conduction Disutrbances:none  Narrative Interpretation:    Old EKG Reviewed: unchanged    Glynn Octave, MD 04/28/12 1744

## 2012-04-28 NOTE — Telephone Encounter (Signed)
Patient called. Chart reviewed while discussing with patient her symptoms. Patient c/o left arm tingling that goes into fingers, pressure under her left shoulder x 3-4 days, "gas"; rates pain 6 on 0-10 scale. States NTG last taken yesterday, helps some.  Has not taken any NTG today.  Patient took her ASA this morning. States she is taking her prescribed medications regularly.  Patient advised to call EMS and be taken to the ED for evaluation.  Patient wanted to be seen in the office this morning - patient advised that she needed to be evaluated at the hospital.

## 2012-04-28 NOTE — ED Notes (Signed)
Pt to CT

## 2012-04-28 NOTE — H&P (Addendum)
Patient ID: Sydney Moore MRN: [id number], DOB/AGE: [birth year]   Admit date: 04/28/2012   Primary Physician: Rene Paci, MD Primary Cardiologist: Katherina Right, MD/J. Allred, MD - EP/M. Excell Seltzer, MD - PV  Pt. Profile:  79 y/o female with h/o CAD, PVD, and heart block s/p PPM, who presents 2/2 chest pain.  Problem List  Past Medical History  Diagnosis Date  . Hypertension   . Hyperlipidemia   . Diabetes mellitus   . Chronic cough     Seen by Dr. Sherene Sires  . Hypothyroid   . CAD (coronary artery disease) 1989    a. 1989: s/p CABGx3;  b. 06/2007 Cath/Attempted PCI: LM min irregs, LAD 90/100, D1 90 (attempted PCI), RI 90, LCX100p, OM1 100, RCA 60/70p, 90/64m, VG->RCA nl, VG->OM 100, LIMA->LAD nl, EF 50%.  . Complete heart block     a. s/p PPM (MDT) by Dr Deborah Chalk 2004; b. Gen change 09/2010 (MDT ADDRL1 Adapta DC PPM Ser #:[id number]  . Carotid artery occlusion 2011    60-79%right/ 40-59% left  . GERD (gastroesophageal reflux disease)   . Chronic systolic CHF (congestive heart failure)     a. 03/2011 Echo: EF 40% inflat HK, Gr1 DD, mod MR/TR    Past Surgical History  Procedure Laterality Date  . Coronary artery bypass graft  1989  . Pacemaker placement  2004    MDT by Dr Deborah Chalk  . Cataract extraction  2004    Both  . Cardiac catheterization  2004, 2007, 2009    Allergies  No Known Allergies  HPI  67 y /o female with h /o CAD s/p remote CABG.  Most recently, she is s/p attempted PCI of the LAD/Diag in 2009.  She has been medically managed since.  At baseline, she does reasonably well however is is a day and over the past several months, she's begun to experience exertional chest pressure and tightness associated with dyspnea, occurring several times per week, and resolving with rest. She has taken nitroglycerin for this at times with success.  Exertional angina is similar to prior angina. Over the past week she has also been experiencing intermittent left axillary pain is often  associated with belching and improved with belching. She takes Nexium at home and thought maybe this was related to reflux. The axillary discomfort occurs at rest and has been occurring almost daily over the past week. Because of the axillary discomfort she presented to the ED today where her ECG shows a paced rhythm and her initial troponin is normal. Her d-dimer was mildly elevated and therefore she is undergoing CT angiography chest at this time. Upon interview, she was pain-free.  Home Medications  Prior to Admission medications   Medication Sig Start Date End Date Taking? Authorizing Provider  amLODipine (NORVASC) 5 MG tablet Take 1 tablet (5 mg total) by mouth daily. 10/28/11  Yes Vesta Mixer, MD  aspirin 81 MG tablet Take 81 mg by mouth every morning.     Yes Historical Provider, MD  atorvastatin (LIPITOR) 20 MG tablet Take 1 tablet (20 mg total) by mouth daily. 10/24/11  Yes Newt Lukes, MD  calcium-vitamin D (OSCAL WITH D) 500-200 MG-UNIT per tablet Take 1 tablet by mouth daily.     Yes Historical Provider, MD  clobetasol cream (TEMOVATE) 0.05 % Apply 1 application topically as needed. Apply to affected on skin twice a day as needed for rash 07/01/11  Yes Newt Lukes, MD  clopidogrel (PLAVIX) 75 MG tablet Take 75  mg by mouth daily.    Yes Historical Provider, MD  esomeprazole (NEXIUM) 40 MG capsule Take 40 mg by mouth 2 (two) times daily.   Yes Historical Provider, MD  furosemide (LASIX) 40 MG tablet Take 1 tablet (40 mg total) by mouth daily. 10/10/11  Yes Newt Lukes, MD  isosorbide mononitrate (IMDUR) 60 MG 24 hr tablet Take 60 mg by mouth daily.    Yes Historical Provider, MD  levothyroxine (SYNTHROID, LEVOTHROID) 50 MCG tablet Take 50 mcg by mouth daily.     Yes Historical Provider, MD  losartan (COZAAR) 50 MG tablet Take 1 tablet (50 mg total) by mouth daily. 10/28/11  Yes Vesta Mixer, MD  metFORMIN (GLUCOPHAGE) 500 MG tablet Take 250 mg by mouth at bedtime.    Yes Historical Provider, MD  metoprolol succinate (TOPROL-XL) 50 MG 24 hr tablet Take 50 mg by mouth daily. Take with or immediately following a meal.   Yes Historical Provider, MD  metroNIDAZOLE (METROGEL) 1 % gel Apply 1 application topically daily as needed (for vaginal).   Yes Historical Provider, MD  NITROSTAT 0.4 MG SL tablet PLACE 1 TABLET UNDER THE TONGUE EVERY 5 MINUTES AS NEEDED FOR CHEST PAIN 12/05/11  Yes Newt Lukes, MD  potassium chloride SA (K-DUR,KLOR-CON) 20 MEQ tablet Take 20 mEq by mouth every evening.   Yes Historical Provider, MD  ranolazine (RANEXA) 500 MG 12 hr tablet Take 500 mg by mouth 2 (two) times daily.    Yes Historical Provider, MD  simvastatin (ZOCOR) 40 MG tablet Take 40 mg by mouth every evening.   Yes Historical Provider, MD   Family History  Family History  Problem Relation Age of Onset  . Hypertension Mother   . Liver cancer Brother   . Breast cancer Daughter   . Colon cancer Brother   . Cancer Brother     colon   Social History  History   Social History  . Marital Status: Widowed    Spouse Name: N/A    Number of Children: 2  . Years of Education: N/A   Occupational History  . 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 Smoking status: Former Smoker    Quit date: 12/15/1973  . Smokeless tobacco: Never Used  . Alcohol Use: No  . Drug Use: No  . Sexually Active: No   Other Topics Concern  . Not on file   Social History Narrative   Health Care POA: son, Shiara Mcgough   Emergency Contact: son, Brittlyn Cloe [phone]   End of Life Plan:    Who lives with you: Self   Any pets: none   Diet: Patient has a varied diet of protein, starch, vegetables, and fruit   Exercise: Patient does group exercises 2x week and walks 2x week.   Seatbelts: Patient reports wearing seatbelt when in vehicle.    Sun Exposure/Protection: Patient reports not using sun protection.   Hobbies: Church, watching tv, walking           Review of Systems General:  No chills, fever, night sweats or weight changes.  Cardiovascular:  +++ chest pain, +++dyspnea on exertion, edema, orthopnea, palpitations, paroxysmal nocturnal dyspnea. Dermatological: No rash, lesions/masses Respiratory: No cough, +++dyspnea Urologic: No hematuria, dysuria Abdominal:   No nausea, vomiting, diarrhea, bright red blood per rectum, melena, or hematemesis Neurologic:  No visual changes, wkns, changes in mental status. All other systems reviewed and are otherwise negative except as noted above.  Physical Exam  Blood pressure 145/58, pulse 61, temperature 98.2 F (36.8 C), temperature source Oral, resp. rate 17, SpO2 100.00%.  General: Pleasant, NAD Psych: Normal affect. Neuro: Alert and oriented X 3. Moves all extremities spontaneously. HEENT: Normal  Neck: Supple. She has a bruit versus radiated murmur noted on the left. Lungs:  Resp regular and unlabored, CTA. Heart: RRR no s3, s4, 2/6 systolic ejection murmur at the right upper sternal border. Abdomen: Soft, non-tender, non-distended, BS + x 4.  Extremities: No clubbing, cyanosis or edema. DP/PT/Radials 1+ and equal bilaterally.  Labs   Recent Labs  04/28/12 1315  TROPONINI <0.30   Lab Results  Component Value Date   WBC 4.7 04/28/2012   HGB 13.2 04/28/2012   HCT 36.8 04/28/2012   MCV 89.8 04/28/2012   PLT 213 04/28/2012     Recent Labs Lab 04/28/12 1314  NA 137  K 3.8  CL 97  CO2 31  BUN 16  CREATININE 0.92  CALCIUM 9.7  PROT 8.2  BILITOT 0.5  ALKPHOS 74  ALT 21  AST 34  GLUCOSE 111*   Lab Results  Component Value Date   CHOL 138 04/09/2012   HDL 40.30 04/09/2012   LDLCALC 69 04/09/2012   TRIG 146.0 04/09/2012   Lab Results  Component Value Date   DDIMER 1.08* 04/28/2012    Radiology/Studies  Dg Chest 2 View  04/28/2012  *RADIOLOGY REPORT*  Clinical Data: Left lateral chest pain.  CHEST - 2 VIEW  Comparison: Chest x-ray 02/2010.  Findings: Lung volumes  are slightly low.  No consolidative airspace disease.  No pleural effusions.  No evidence of pulmonary edema. Mild cardiomegaly is unchanged.  Atherosclerosis in the thoracic aorta.  Right-sided pacemaker device in place with lead tips projecting over the expected location of the right atrium and right ventricular apex.  Status post median sternotomy for CABG.  IMPRESSION: 1.  No radiographic evidence of acute cardiopulmonary disease. 2.  Cardiomegaly. 3.  Atherosclerosis. 4.  Postoperative changes and support apparatus, as above.   Original Report Authenticated By: Trudie Reed, M.D.    ECG  A sense, V pace, 66.  ASSESSMENT AND PLAN  1. Unstable angina/coronary artery disease: Patient presents with a several month history of progressive exertional angina and also axillary pain occurs at rest. Exertional angina similar to prior angina. Initial enzymes are negative. We will plan to admit and cycle enzymes. Continue home medications. If she rules in would add heparin and plan on catheterization in the a.m. Otherwise defer to her primary cardiologist for ischemic workup (stress testing versus cath).  2. Chronic systolic congestive heart failure: EF 40% by echo in February 2013. She is euvolemic on exam. Continue beta blocker and ARB.   3. Hypertension: Continue home medications and follow and adjust as necessary.  4. Hyperlipidemia: Continue statin therapy. LDL was 69 last month.  5. Diabetes mellitus: Hold metformin. Add sliding scale insulin.  Signed, Nicolasa Ducking, NP 04/28/2012, 3:57 PM  I have personally seen and examined this patient with Ward Givens, NP. I agree with the assessment and plan as outlined above. Her chest pain has typical and atypical features. Will admit, rule out for MI with serial enzymes. Will discuss cardiac cath vs stress testing in am.   MCALHANY,CHRISTOPHER 5:07 PM 04/28/2012  Addendum: 04/28/12 at 5:22pm CT chest and abdomen ordered by ED staff:  No  evidence of acute abnormality - no evidence of pulmonary emboli  or thoracic aortic aneurysm/dissection.  Cardiomegaly, CABG changes  and moderate descending thoracic aortic  atherosclerotic plaque/calcification. Two separate short segment dissections, one within the suprarenal  abdominal aorta measuring 8 mm in length and one in the left common  iliac artery measuring 14 mm in length - both of uncertain  chronicity.  Moderate abdominal aortic atherosclerotic calcification without  evidence of abdominal aortic aneurysm.  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  The findings on the CT abdomen are non-specific. 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 Unrelated to her presentation of axillary pain. Discussed with radiology, Dr. Vanetta Shawl 5:33 PM 04/28/2012

## 2012-04-28 NOTE — ED Notes (Signed)
Dr. Rancour at bedside. 

## 2012-04-28 NOTE — ED Notes (Signed)
Pt unhooked from monitor to go to restroom. Pt self ambulated with standby assistance

## 2012-04-28 NOTE — ED Notes (Signed)
Dinner tray ordered for pt. Spoke with [employer] from service response.

## 2012-04-29 DIAGNOSIS — I200 Unstable angina: Secondary | ICD-10-CM

## 2012-04-29 LAB — GLUCOSE, CAPILLARY
Glucose-Capillary: 95 mg/dL (ref 70–99)
Glucose-Capillary: 94 mg/dL (ref 70–99)

## 2012-04-29 MED ORDER — LOSARTAN POTASSIUM 50 MG PO TABS
100.0000 mg | ORAL_TABLET | Freq: Every day | ORAL | Status: DC
  Administered 2012-04-29: 100 mg via ORAL
  Filled 2012-04-29: qty 2

## 2012-04-29 MED ORDER — LOSARTAN POTASSIUM 50 MG PO TABS: 100.0000 mg | ORAL_TABLET | Freq: Every day | ORAL | Status: DC

## 2012-04-29 MED ORDER — ISOSORBIDE MONONITRATE ER 60 MG PO TB24: 90.0000 mg | ORAL_TABLET | Freq: Every day | ORAL | Status: DC

## 2012-04-29 NOTE — Progress Notes (Signed)
Patient Name: Sydney Moore Date of Encounter: 04/29/2012   Principal Problem:   Unstable angina Active Problems:   CAROTID ARTERY DISEASE   DIABETES MELLITUS   HYPERTENSION, BENIGN ESSENTIAL   Chronic combined systolic and diastolic congestive heart failure   HYPERLIPIDEMIA   Atrioventricular block, complete   GERD (gastroesophageal reflux disease)   HYPOTHYROIDISM   SUBJECTIVE  1 brief episode of left axillary discomfort overnight, lasting a few seconds and resolving spontaneously.  No midsternal c/p or sob.  Feels well this AM.  Eager to go home.  CURRENT MEDS . amLODipine  5 mg Oral Daily  . aspirin  81 mg Oral [id number]  . atorvastatin  20 mg Oral Daily  . calcium-vitamin D  1 tablet Oral Daily  . clopidogrel  75 mg Oral Daily  . furosemide  40 mg Oral Daily  . heparin  5,000 Units Subcutaneous Q8H  . insulin aspart  0-15 Units Subcutaneous TID WC  . isosorbide mononitrate  60 mg Oral Daily  . levothyroxine  50 mcg Oral QAC breakfast  . losartan  50 mg Oral Daily  . metoprolol succinate  50 mg Oral Daily  . pantoprazole  80 mg Oral Q1200  . potassium chloride SA  20 mEq Oral QPM  . ranolazine  500 mg Oral BID   OBJECTIVE  Filed Vitals:   04/28/12 1630 04/28/12 1753 04/28/12 2051 04/29/12 0500  BP: 173/72 168/75 133/67 144/69  Pulse: 62 65 61 62  Temp:  97.9 F (36.6 C) 97.9 F (36.6 C) 98 F (36.7 C)  TempSrc:  Oral Oral Oral  Resp: 19 18 16 16   Height:  5\' 3"  (1.6 m)    Weight:  124 lb 4.8 oz (56.382 kg)  124 lb 14.4 oz (56.654 kg)  SpO2: 99% 95% 94% 97%   No intake or output data in the 24 hours ending 04/29/12 0752 Filed Weights   04/28/12 1753 04/29/12 0500  Weight: 124 lb 4.8 oz (56.382 kg) 124 lb 14.4 oz (56.654 kg)   PHYSICAL EXAM  General: Pleasant, NAD. Neuro: Alert and oriented X 3. Moves all extremities spontaneously. Psych: Normal affect. HEENT:  Normal  Neck: Supple without JVD.  R bruit. Lungs:  Resp regular and unlabored,  CTA. Heart: RRR no s3, s4, 2/5 sem rusb Abdomen: Soft, non-tender, non-distended, BS + x 4.  Extremities: No clubbing, cyanosis or edema. DP/PT/Radials 1+ and equal bilaterally.  Accessory Clinical Findings  CBC  Recent Labs  04/28/12 1314 04/28/12 1757  WBC 4.7 4.6  NEUTROABS 2.5  --   HGB 13.2 12.0  HCT 36.8 34.4*  MCV 89.8 89.1  PLT 213 196   Basic Metabolic Panel  Recent Labs  04/28/12 1314 04/28/12 1757  NA 137  --   K 3.8  --   CL 97  --   CO2 31  --   GLUCOSE 111*  --   BUN 16  --   CREATININE 0.92 0.93  CALCIUM 9.7  --    Liver Function Tests  Recent Labs  04/28/12 1314  AST 34  ALT 21  ALKPHOS 74  BILITOT 0.5  PROT 8.2  ALBUMIN 4.5   Cardiac Enzymes  Recent Labs  04/28/12 1757 04/28/12 2336 04/29/12 0600  TROPONINI <0.30 <0.30 <0.30   D-Dimer  Recent Labs  04/28/12 1314  DDIMER 1.08*   TELE  AV paced.  ECG  AV paced, 60, no acute st/t changes.  Radiology/Studies  Dg Chest 2 View  04/28/2012  *  RADIOLOGY REPORT*  Clinical Data: Left lateral chest pain.  CHEST - 2 VIEW    IMPRESSION: 1.  No radiographic evidence of acute cardiopulmonary disease. 2.  Cardiomegaly. 3.  Atherosclerosis. 4.  Postoperative changes and support apparatus, as above.   Original Report Authenticated By: Trudie Reed, M.D.    Ct Angio Abd/pel W/ And/or W/o  04/28/2012  *RADIOLOGY REPORT*  Clinical Data:  79 year old female with chest, abdominal and pelvic pain and shortness of breath.  CT ANGIOGRAPHY CHEST, ABDOMEN AND PELVIS  Technique:  Multidetector CT imaging through the chest, abdomen and pelvis was performed using the standard protocol during bolus administration of intravenous contrast.  Multiplanar reconstructed images including MIPs were obtained and reviewed to evaluate the vascular anatomy.  Contrast: OMNIPAQUE IOHEXOL 350 MG/ML SOLN  Comparison:  04/28/2012 chest radiograph  CTA CHEST  Findings:  IMPRESSION: No evidence of acute abnormality  - no evidence of pulmonary emboli or thoracic aortic aneurysm/dissection.  Cardiomegaly, CABG changes and moderate descending thoracic aortic atherosclerotic plaque/calcification.  CTA ABDOMEN AND PELVIS IMPRESSION: Two separate short segment dissections, one within the suprarenal abdominal aorta measuring 8 mm in length and one in the left common iliac artery measuring 14 mm in length - both of uncertain chronicity.  Moderate abdominal aortic atherosclerotic calcification without evidence of abdominal aortic aneurysm.  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   Original Report Authenticated By: Harmon Pier, M.D.    ASSESSMENT AND PLAN  1.  USA/CAD:  Pt presented to ED yesterday primarily 2/2 intermittent left axillary pain.  She also reported exertional angina over the past few months.  CE are negative this AM. ECG is paced.  Her last cath in 2009 showed multivessel dzs with patent LIMA->LAD and patent VG->RCA.  Attempt was made @ PCI of the D1 at that time and was unsuccessful.  She has been maintained on chronic nitrate and ranexa therapy.  She ate breakfast this AM.  Consider d/c with early f/u and stress testing as an outpt vs cath later this afternoon.  Will d/w Dr. Clifton James.  Cont bb, arb, asa, plavix, statin, imdur, ranexa.  2.  Chronic combined syst/diast chf:  Euvolemic.  Cont bb/arb.  3.  HTN:  BP elevated.  Increase cozaar to 100mg  daily.  4.  HL:  LDL 69 03/2012.  Cont statin therapy.  5.  DM:  Cont ssi.  Metformin on hold.  6.  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  Titrate ARB for BP control.  Cont bb.  Signed, Nicolasa Ducking NP  I have personally seen and examined this patient with Ward Givens, NP.  I agree with the assessment and plan as outlined above. Her axillary pain does not seem cardiac. Cardiac markers are negative. Pain resolved. Will d/c  home today. Close f/u with Dr. Elease Hashimoto in next 2 weeks. If she is still having pain, could consider outpatient stress myoview at that time.   Troy Hartzog 9:29 AM 04/29/2012

## 2012-04-29 NOTE — Discharge Summary (Signed)
Patient ID: Sydney Moore,  MRN: [id number], DOB/AGE: [birth year] 79 y.o.  Admit date: 04/28/2012 Discharge date: 04/29/2012  Primary Care Provider: Rene Paci Primary Cardiologist: Katherina Right, MD  Discharge Diagnoses Principal Problem:   Unstable angina Active Problems:   CAROTID ARTERY DISEASE   DIABETES MELLITUS   HYPERTENSION, BENIGN ESSENTIAL   Chronic combined systolic and diastolic congestive heart failure   HYPERLIPIDEMIA   Atrioventricular block, complete   GERD (gastroesophageal reflux disease)   HYPOTHYROIDISM  Allergies No Known Allergies  Procedures  CTA Chest, Abdomen, and Pelvis  IMPRESSION: No evidence of acute abnormality - no evidence of pulmonary emboli or thoracic aortic aneurysm/dissection.  Cardiomegaly, CABG changes and moderate descending thoracic aortic atherosclerotic plaque/calcification.  Two separate short segment dissections, one within the suprarenal abdominal aorta measuring 8 mm in length and one in the left common iliac artery measuring 14 mm in length - both of uncertain Chronicity.   **Reviewed with radiology, felt most likely to be chronic and not clinically significant**  Moderate abdominal aortic atherosclerotic calcification without evidence of abdominal aortic aneurysm.  Occluded proximal internal common iliac arteries bilaterally _____________  History of Present Illness  79 y/o female with h/o CAD s/p remote CABG, PVF, and CHB s/p PPM.  She was in her USOH until a few months prior to admission when she began to experience intermittent exertional chest pressure and tightness, similar to prior angina, that would relieve with rest.  Over a 1 week period prior to admission, she began to note left axillary pain occurring at rest and improved with belching.  She presented to the ED on 3/19 primarily related to the latter discomfort.  In the ED, CE were negative and ECG was paced/non-acute.  CTA of the chest, abdomen, and pelvis  was performed and revealed two separate short segment dissections as outlined above.  This finding was reviewed with radiology and felt most likely to be chronic and not clinically significant.  Hospital Course  Following admission, pt r/o for MI.  She did have one additional, brief episode of left axillary pain however in the absence of objective evidence of ischemia at this time, we plan to discharge her home with early outpatient follow-up for consideration of stress testing given several month history of exertional angina.  She remains on nitrate (increased to 90mg  daily) and ranexa therapy and her antihypertensive therapy has been titrated as well.  She will be discharged home today in good condition.  Discharge Vitals Blood pressure 123/49, pulse 69, temperature 98 F (36.7 C), temperature source Oral, resp. rate 16, height 5\' 3"  (1.6 m), weight 124 lb 14.4 oz (56.654 kg), SpO2 97.00%.  Filed Weights   04/28/12 1753 04/29/12 0500  Weight: 124 lb 4.8 oz (56.382 kg) 124 lb 14.4 oz (56.654 kg)    Labs  CBC  Recent Labs  04/28/12 1314 04/28/12 1757  WBC 4.7 4.6  NEUTROABS 2.5  --   HGB 13.2 12.0  HCT 36.8 34.4*  MCV 89.8 89.1  PLT 213 196   Basic Metabolic Panel  Recent Labs  04/28/12 1314 04/28/12 1757  NA 137  --   K 3.8  --   CL 97  --   CO2 31  --   GLUCOSE 111*  --   BUN 16  --   CREATININE 0.92 0.93  CALCIUM 9.7  --    Liver Function Tests  Recent Labs  04/28/12 1314  AST 34  ALT 21  ALKPHOS 74  BILITOT 0.5  PROT 8.2  ALBUMIN 4.5   Cardiac Enzymes  Recent Labs  04/28/12 1757 04/28/12 2336 04/29/12 0600  TROPONINI <0.30 <0.30 <0.30   D-Dimer  Recent Labs  04/28/12 1314  DDIMER 1.08*   Disposition  Pt is being discharged home today in good condition.  Follow-up Plans & Appointments      Follow-up Information   Follow up with [name], NP On 05/14/2012. (2:00 PM)    Contact information:   [address] [place] [address] [phone]      Discharge Medications    Medication List    STOP taking these medications       simvastatin 40 MG tablet  Commonly known as:  ZOCOR      TAKE these medications       amLODipine 5 MG tablet  Commonly known as:  NORVASC  Take 1 tablet (5 mg total) by mouth daily.     aspirin 81 MG tablet  Take 81 mg by mouth every morning.     atorvastatin 20 MG tablet  Commonly known as:  LIPITOR  Take 1 tablet (20 mg total) by mouth daily.     calcium-vitamin D 500-200 MG-UNIT per tablet  Commonly known as:  OSCAL WITH D  Take 1 tablet by mouth daily.     clobetasol cream 0.05 %  Commonly known as:  TEMOVATE  Apply 1 application topically as needed. Apply to affected on skin twice a day as needed for rash     clopidogrel 75 MG tablet  Commonly known as:  PLAVIX  Take 75 mg by mouth daily.     esomeprazole 40 MG capsule  Commonly known as:  NEXIUM  Take 40 mg by mouth 2 (two) times daily.     furosemide 40 MG tablet  Commonly known as:  LASIX  Take 1 tablet (40 mg total) by mouth daily.     isosorbide mononitrate 60 MG 24 hr tablet  Commonly known as:  IMDUR  Take 1.5 tablets (90 mg total) by mouth daily.     levothyroxine 50 MCG tablet  Commonly known as:  SYNTHROID, LEVOTHROID  Take 50 mcg by mouth daily.     losartan 50 MG tablet  Commonly known as:  COZAAR  Take 2 tablets (100 mg total) by mouth daily.     metFORMIN 500 MG tablet  Commonly known as:  GLUCOPHAGE  Take 250 mg by mouth at bedtime.     metoprolol succinate 50 MG 24 hr tablet  Commonly known as:  TOPROL-XL  Take 50 mg by mouth daily. Take with or immediately following a meal.     metroNIDAZOLE 1 % gel  Commonly known as:  METROGEL  Apply 1 application topically daily as needed (for vaginal).     NITROSTAT 0.4 MG SL tablet  Generic drug:  nitroGLYCERIN  PLACE 1 TABLET UNDER THE TONGUE EVERY 5 MINUTES AS NEEDED FOR CHEST PAIN     potassium chloride SA  20 MEQ tablet  Commonly known as:  K-DUR,KLOR-CON  Take 20 mEq by mouth every evening.     ranolazine 500 MG 12 hr tablet  Commonly known as:  RANEXA  Take 500 mg by mouth 2 (two) times daily.       Outstanding Labs/Studies  None  Duration of Discharge Encounter   Greater than 30 minutes including physician time.  Signed, Nicolasa Ducking NP 04/29/2012, 11:50 AM

## 2012-04-29 NOTE — Progress Notes (Signed)
Utilization review completed.  

## 2012-04-29 NOTE — Discharge Summary (Signed)
See full note this am. cdm 

## 2012-04-30 ENCOUNTER — Telehealth: Payer: Self-pay | Admitting: Family Medicine

## 2012-04-30 MED ORDER — PANTOPRAZOLE SODIUM 40 MG PO TBEC: 40.0000 mg | DELAYED_RELEASE_TABLET | Freq: Every day | ORAL | Status: DC

## 2012-04-30 NOTE — Telephone Encounter (Signed)
Pt d/c'd from [facility] on 04/29/2012.  Spoke with pt regarding d/c instructions and home care.  Pt states that she is "feeling much better" today.  Denies any chest pain or discomfort.  Understands d/c instructions and has stopped taking Zocor as recommended. RN reviewed medication list, no further questions. F/U appt on 05/14/12 with [name], NP.    Pt did have a question from last OV on 04/09/12.  She said Dr. Felicity Coyer was supposed to call in an alternative for Nexium but it was not called in.  She would like to know if she should start back on the Nexium.

## 2012-04-30 NOTE — Telephone Encounter (Signed)
Change nexium to protonix - erx done - thanks

## 2012-05-14 ENCOUNTER — Ambulatory Visit (INDEPENDENT_AMBULATORY_CARE_PROVIDER_SITE_OTHER): Payer: Medicare Other | Admitting: *Deleted

## 2012-05-14 ENCOUNTER — Encounter: Payer: Self-pay | Admitting: Internal Medicine

## 2012-05-14 ENCOUNTER — Ambulatory Visit (INDEPENDENT_AMBULATORY_CARE_PROVIDER_SITE_OTHER): Payer: Medicare Other | Admitting: Nurse Practitioner

## 2012-05-14 ENCOUNTER — Other Ambulatory Visit: Payer: Self-pay | Admitting: Internal Medicine

## 2012-05-14 ENCOUNTER — Encounter: Payer: Self-pay | Admitting: Nurse Practitioner

## 2012-05-14 VITALS — BP 130/60 | HR 72 | Ht 61.0 in | Wt 129.0 lb

## 2012-05-14 DIAGNOSIS — I442 Atrioventricular block, complete: Secondary | ICD-10-CM

## 2012-05-14 DIAGNOSIS — I2510 Atherosclerotic heart disease of native coronary artery without angina pectoris: Secondary | ICD-10-CM

## 2012-05-14 LAB — PACEMAKER DEVICE OBSERVATION
RV LEAD THRESHOLD: 0.75 V
VENTRICULAR PACING PM: 99

## 2012-05-14 NOTE — Progress Notes (Signed)
PPM check 

## 2012-05-14 NOTE — Patient Instructions (Addendum)
i think you are doing well  Stay on your current medicines  Stay active  See Dr. Elease Hashimoto in 3 months  Call the [facility] office at [phone] if you have any questions, problems or concerns.

## 2012-05-14 NOTE — Progress Notes (Signed)
Sydney Moore Date of Birth: [birth year] Medical Record #[id number]  History of Present Illness: Sydney Moore is seen back today for a post hospital visit. She is seen for Dr. Elease Hashimoto. She has a history of CAD, remote CABG, PVD (total occlusion of the SFA with collaterals present - managed conservatively) and CHB with PPM in place. Most recently admitted with worsening angina. No objective evidence of ischemia noted. Nitrates were increased and she was continued on her Ranexa with plans to consider outpatient stress testing. She did have a CT showing 2 separate short segment dissections which was felt to be chronic and not clinically significant.   She comes in today. She is here alone. She is doing great. She says she actually went to the hospital with her side hurting, fingers tingling and trouble sleeping. She says her chest pain is about the same. She will use NTG on occasion. This is no different and seems to be unchanged. Not short of breath. Ok on her medicines.   Current Outpatient Prescriptions on File Prior to Visit  Medication Sig Dispense Refill  . amLODipine (NORVASC) 5 MG tablet Take 1 tablet (5 mg total) by mouth daily.  30 tablet  5  . aspirin 81 MG tablet Take 81 mg by mouth every morning.        Marland Kitchen atorvastatin (LIPITOR) 20 MG tablet Take 1 tablet (20 mg total) by mouth daily.  30 tablet  11  . calcium-vitamin D (OSCAL WITH D) 500-200 MG-UNIT per tablet Take 1 tablet by mouth daily.        . clobetasol cream (TEMOVATE) 0.05 % Apply 1 application topically as needed. Apply to affected on skin twice a day as needed for rash      . clopidogrel (PLAVIX) 75 MG tablet Take 75 mg by mouth daily.       . furosemide (LASIX) 40 MG tablet Take 1 tablet (40 mg total) by mouth daily.  30 tablet  11  . isosorbide mononitrate (IMDUR) 60 MG 24 hr tablet Take 1.5 tablets (90 mg total) by mouth daily.  45 tablet  6  . levothyroxine (SYNTHROID, LEVOTHROID) 50 MCG tablet Take 50 mcg by mouth daily.         Marland Kitchen losartan (COZAAR) 50 MG tablet Take 2 tablets (100 mg total) by mouth daily.  30 tablet  6  . metFORMIN (GLUCOPHAGE) 500 MG tablet Take 250 mg by mouth at bedtime.      . metoprolol succinate (TOPROL-XL) 50 MG 24 hr tablet Take 50 mg by mouth daily. Take with or immediately following a meal.      . metroNIDAZOLE (METROGEL) 1 % gel Apply 1 application topically daily as needed (for vaginal).      Marland Kitchen NITROSTAT 0.4 MG SL tablet PLACE 1 TABLET UNDER THE TONGUE EVERY 5 MINUTES AS NEEDED FOR CHEST PAIN  90 each  3  . pantoprazole (PROTONIX) 40 MG tablet Take 1 tablet (40 mg total) by mouth daily.  30 tablet  3  . potassium chloride SA (K-DUR,KLOR-CON) 20 MEQ tablet Take 20 mEq by mouth every evening.      . ranolazine (RANEXA) 500 MG 12 hr tablet Take 500 mg by mouth 2 (two) times daily.        No current facility-administered medications on file prior to visit.    No Known Allergies  Past Medical History  Diagnosis Date  . Hypertension   . Hyperlipidemia   . Diabetes mellitus   . Chronic  cough     Seen by Dr. Sherene Sires  . Hypothyroid   . CAD (coronary artery disease) 1989    a. 1989: s/p CABGx3;  b. 06/2007 Cath/Attempted PCI: LM min irregs, LAD 90/100, D1 90 (attempted PCI), RI 90, LCX100p, OM1 100, RCA 60/70p, 90/14m, VG->RCA nl, VG->OM 100, LIMA->LAD nl, EF 50%.  . Complete heart block     a. s/p PPM (MDT) by Dr Deborah Chalk 2004; b. Gen change 09/2010 (MDT ADDRL1 Adapta DC PPM Ser #:[id number]  . Carotid artery occlusion 2011    60-79%right/ 40-59% left  . GERD (gastroesophageal reflux disease)   . Chronic systolic CHF (congestive heart failure)     a. 03/2011 Echo: EF 40% inflat HK, Gr1 DD, mod MR/TR    Past Surgical History  Procedure Laterality Date  . Coronary artery bypass graft  1989  . Pacemaker placement  2004    MDT by Dr Deborah Chalk  . Cataract extraction  2004    Both  . Cardiac catheterization  2004, 2007, 2009    History  Smoking status  . Former Smoker  . Quit date:  12/15/1973  Smokeless tobacco  . Never Used    History  Alcohol Use No    Family History  Problem Relation Age of Onset  . Hypertension Mother   . Liver cancer Brother   . Breast cancer Daughter   . Colon cancer Brother   . Cancer Brother     colon    Review of Systems: The review of systems is per the HPI.  All other systems were reviewed and are negative.  Physical Exam: There were no vitals taken for this visit. Patient is very pleasant and in no acute distress. Skin is warm and dry. Color is normal.  HEENT is unremarkable. Normocephalic/atraumatic. PERRL. Sclera are nonicteric. Neck is supple. No masses. No JVD. Lungs are clear. Cardiac exam shows a regular rate and rhythm. Abdomen is soft. Extremities are without edema. Gait and ROM are intact. No gross neurologic deficits noted.  LABORATORY DATA:  Lab Results  Component Value Date   WBC 4.6 04/28/2012   HGB 12.0 04/28/2012   HCT 34.4* 04/28/2012   PLT 196 04/28/2012   GLUCOSE 111* 04/28/2012   CHOL 138 04/09/2012   TRIG 146.0 04/09/2012   HDL 40.30 04/09/2012   LDLDIRECT 58 12/18/2008   LDLCALC 69 04/09/2012   ALT 21 04/28/2012   AST 34 04/28/2012   NA 137 04/28/2012   K 3.8 04/28/2012   CL 97 04/28/2012   CREATININE 0.93 04/28/2012   BUN 16 04/28/2012   CO2 31 04/28/2012   TSH 3.37 04/09/2012   INR 1.02 04/28/2012   HGBA1C 6.3 04/09/2012   Dg Chest 2 View  04/28/2012  IMPRESSION: 1.  No radiographic evidence of acute cardiopulmonary disease. 2.  Cardiomegaly. 3.  Atherosclerosis. 4.  Postoperative changes and support apparatus, as above.   Original Report Authenticated By: Trudie Reed, M.D.    Ct Angio Chest Aortic Dissect W &/or W/o  04/28/2012   IMPRESSION: Two separate short segment dissections, one within the suprarenal abdominal aorta measuring 8 mm in length and one in the left common iliac artery measuring 14 mm in length - both of uncertain chronicity.  Moderate abdominal aortic atherosclerotic calcification  without evidence of abdominal aortic aneurysm.  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   Original Report Authenticated By: Harmon Pier, M.D.     Assessment / Plan: 1. Unstable angina with known CAD - remote CABG -  managed medically - she is really at her baseline. I do not see a need to proceed with stress testing. Would continue with her current regimen. See her back in 3 months with fasting labs.   2. HTN - blood pressure looks good.  3. HLD - now on Lipitor - recheck labs on return  4. PVD - stable  5. PTVP - checked today here in the office.   Patient is agreeable to this plan and will call if any problems develop in the interim.   Rosalio Macadamia, RN, ANP-C [facility] [address] [place], [place]  [address]

## 2012-06-07 ENCOUNTER — Ambulatory Visit: Payer: Medicare Other | Admitting: Neurosurgery

## 2012-06-07 ENCOUNTER — Other Ambulatory Visit: Payer: Self-pay

## 2012-06-07 ENCOUNTER — Other Ambulatory Visit: Payer: Medicare Other

## 2012-06-07 DIAGNOSIS — Z1231 Encounter for screening mammogram for malignant neoplasm of breast: Secondary | ICD-10-CM

## 2012-07-01 ENCOUNTER — Ambulatory Visit
Admission: RE | Admit: 2012-07-01 | Discharge: 2012-07-01 | Disposition: A | Discharge status: Home / Self Care | Payer: Medicare Other | Source: Ambulatory Visit

## 2012-07-01 DIAGNOSIS — Z1231 Encounter for screening mammogram for malignant neoplasm of breast: Secondary | ICD-10-CM

## 2012-07-23 ENCOUNTER — Encounter: Payer: Self-pay | Admitting: Surgery

## 2012-07-26 ENCOUNTER — Other Ambulatory Visit (INDEPENDENT_AMBULATORY_CARE_PROVIDER_SITE_OTHER): Payer: Medicare Other | Admitting: *Deleted

## 2012-07-26 ENCOUNTER — Ambulatory Visit (INDEPENDENT_AMBULATORY_CARE_PROVIDER_SITE_OTHER): Payer: Medicare Other | Admitting: Surgery

## 2012-07-26 ENCOUNTER — Encounter: Payer: Self-pay | Admitting: Surgery

## 2012-07-26 DIAGNOSIS — Z48812 Encounter for surgical aftercare following surgery on the circulatory system: Secondary | ICD-10-CM

## 2012-07-26 DIAGNOSIS — I6529 Occlusion and stenosis of unspecified carotid artery: Secondary | ICD-10-CM

## 2012-07-26 NOTE — Addendum Note (Signed)
Addended by: Adria Dill L on: 07/26/2012 11:09 AM   Modules accepted: Orders

## 2012-07-26 NOTE — Progress Notes (Signed)
[facility]   Patient name: Sydney Moore MRN: [id number] DOB: May 26, 1923 Sex: female     Chief Complaint  Patient presents with  . Carotid    One year  carotid f/up with vascular lab study.- Pt. had chest pain March  2014 went to ED.    HISTORY OF PRESENT ILLNESS: The patient is back today for continued surveillance of her carotid artery occlusive disease. She remained asymptomatic. Specifically, she denies numbness or weakness in either extremity. She denies slurred speech. She denies amaurosis fugax.  The patient continues to to take a statin 4 hypercholesterolemia. She is on stool antiplatelet therapy for her coronary artery disease. She is on oral medication for diabetes management.  Past Medical History  Diagnosis Date  . Hypertension   . Hyperlipidemia   . Diabetes mellitus   . Chronic cough     Seen by Dr. Sherene Sires  . Hypothyroid   . CAD (coronary artery disease) 1989    a. 1989: s/p CABGx3;  b. 06/2007 Cath/Attempted PCI: LM min irregs, LAD 90/100, D1 90 (attempted PCI), RI 90, LCX100p, OM1 100, RCA 60/70p, 90/83m, VG->RCA nl, VG->OM 100, LIMA->LAD nl, EF 50%.  . Complete heart block     a. s/p PPM (MDT) by Dr Deborah Chalk 2004; b. Gen change 09/2010 (MDT ADDRL1 Adapta DC PPM Ser #:[id number]  . Carotid artery occlusion 2011    60-79%right/ 40-59% left  . GERD (gastroesophageal reflux disease)   . Chronic systolic CHF (congestive heart failure)     a. 03/2011 Echo: EF 40% inflat HK, Gr1 DD, mod MR/TR  . Chest pain March 2014    Past Surgical History  Procedure Laterality Date  . Coronary artery bypass graft  1989  . Pacemaker placement  2004    MDT by Dr Deborah Chalk  . Cataract extraction  2004    Both  . Cardiac catheterization  2004, 2007, 2009  . Eye surgery      History   Social History  . Marital Status: Widowed    Spouse Name: N/A    Number of Children: 2  . Years of Education: N/A   Occupational History  . 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 Smoking status: Former Smoker    Quit date: 12/15/1973  . Smokeless tobacco: Never Used  . Alcohol Use: No  . Drug Use: No  . Sexually Active: No   Other Topics Concern  . Not on file   Social History Narrative   Health Care POA: son, Tasheika Kitzmiller   Emergency Contact: son, Kenniyah Sasaki [phone]   End of Life Plan:    Who lives with you: Self   Any pets: none   Diet: Patient has a varied diet of protein, starch, vegetables, and fruit   Exercise: Patient does group exercises 2x week and walks 2x week.   Seatbelts: Patient reports wearing seatbelt when in vehicle.    Sun Exposure/Protection: Patient reports not using sun protection.   Hobbies: Church, watching tv, walking          Family History  Problem Relation Age of Onset  . Hypertension Mother   . Liver cancer Brother   . Breast cancer Daughter   . Colon cancer Brother   . Cancer Brother     colon    Allergies as of 07/26/2012  . (No Known Allergies)    Current Outpatient Prescriptions on File Prior to Visit  Medication Sig Dispense  Refill  . amLODipine (NORVASC) 5 MG tablet Take 1 tablet (5 mg total) by mouth daily.  30 tablet  5  . aspirin 81 MG tablet Take 81 mg by mouth every morning.        Marland Kitchen atorvastatin (LIPITOR) 20 MG tablet Take 1 tablet (20 mg total) by mouth daily.  30 tablet  11  . calcium-vitamin D (OSCAL WITH D) 500-200 MG-UNIT per tablet Take 1 tablet by mouth daily.        . clobetasol cream (TEMOVATE) 0.05 % Apply 1 application topically as needed. Apply to affected on skin twice a day as needed for rash      . clopidogrel (PLAVIX) 75 MG tablet Take 75 mg by mouth daily.       . furosemide (LASIX) 40 MG tablet Take 1 tablet (40 mg total) by mouth daily.  30 tablet  11  . isosorbide mononitrate (IMDUR) 60 MG 24 hr tablet Take 1.5 tablets (90 mg total) by mouth daily.  45 tablet  6  . levothyroxine (SYNTHROID, LEVOTHROID) 50 MCG tablet Take 50 mcg  by mouth daily.        Marland Kitchen losartan (COZAAR) 50 MG tablet Take 2 tablets (100 mg total) by mouth daily.  30 tablet  6  . metFORMIN (GLUCOPHAGE) 500 MG tablet Take 250 mg by mouth at bedtime.      . metoprolol succinate (TOPROL-XL) 50 MG 24 hr tablet Take 50 mg by mouth daily. Take with or immediately following a meal.      . metroNIDAZOLE (METROGEL) 1 % gel Apply 1 application topically daily as needed (for vaginal).      Marland Kitchen NITROSTAT 0.4 MG SL tablet PLACE 1 TABLET UNDER THE TONGUE EVERY 5 MINUTES AS NEEDED FOR CHEST PAIN  90 each  3  . potassium chloride SA (K-DUR,KLOR-CON) 20 MEQ tablet Take 20 mEq by mouth every evening.      . ranolazine (RANEXA) 500 MG 12 hr tablet Take 500 mg by mouth 2 (two) times daily.       . pantoprazole (PROTONIX) 40 MG tablet Take 1 tablet (40 mg total) by mouth daily.  30 tablet  3   No current facility-administered medications on file prior to visit.     REVIEW OF SYSTEMS: All systems are negative  PHYSICAL EXAMINATION:   Vital signs are BP 127/60  Pulse 69  Resp 16  Ht 5\' 1"  (1.549 m)  Wt 130 lb (58.968 kg)  BMI 24.58 kg/m2  SpO2 98% General: The patient appears their stated age. HEENT:  No gross abnormalities Pulmonary:  Non labored breathing Musculoskeletal: There are no major deformities. Neurologic: No focal weakness or paresthesias are detected, Skin: There are no ulcer or rashes noted. Psychiatric: The patient has normal affect. Cardiovascular: There is a regular rate and rhythm without significant murmur appreciated. Faint bilateral carotid bruits.   Diagnostic Studies Duplex ultrasound was ordered and reviewed today. This shows essentially a staple exam. The right-sided stenosis is in the 40-59% category. It was felt that this may be either represented due to calcified plaque. Her previous exam showed stenosis in the 60-79% range. There was evidence of less than 40% stenosis on the left which was likely either represented. Previously it had  been in the 40-59% category.  Assessment: Asymptomatic carotid stenosis Plan: The patient remains very stable with regards to her carotid artery occlusive disease. I recommended surveillance imaging in one year with an ultrasound.  Jorge Ny, M.D. [facility] [place] Office: [phone] Pager:  [phone]

## 2012-08-06 ENCOUNTER — Encounter: Payer: Self-pay | Admitting: Cardiovascular Disease

## 2012-08-06 ENCOUNTER — Ambulatory Visit (INDEPENDENT_AMBULATORY_CARE_PROVIDER_SITE_OTHER): Payer: Medicare Other | Admitting: Cardiovascular Disease

## 2012-08-06 VITALS — BP 120/68 | HR 86 | Ht 61.0 in | Wt 128.1 lb

## 2012-08-06 DIAGNOSIS — E785 Hyperlipidemia, unspecified: Secondary | ICD-10-CM

## 2012-08-06 LAB — HEPATIC FUNCTION PANEL
ALT: 20 U/L (ref 0–35)
Albumin: 4.2 g/dL (ref 3.5–5.2)
Total Bilirubin: 0.6 mg/dL (ref 0.3–1.2)

## 2012-08-06 LAB — BASIC METABOLIC PANEL
CO2: 26 mEq/L (ref 19–32)
GFR: 65.22 mL/min (ref >60.00)
Glucose, Bld: 97 mg/dL (ref 70–99)
Potassium: 3.7 mEq/L (ref 3.5–5.1)
Sodium: 138 mEq/L (ref 135–145)

## 2012-08-06 LAB — LIPID PANEL
HDL: 43.7 mg/dL (ref >39.00)
Triglycerides: 121 mg/dL (ref 0.0–149.0)
VLDL: 24.2 mg/dL (ref 0.0–40.0)

## 2012-08-06 MED ORDER — RANOLAZINE ER 1000 MG PO TB12: 1000.0000 mg | ORAL_TABLET | Freq: Two times a day (BID) | ORAL | Status: DC

## 2012-08-06 NOTE — Assessment & Plan Note (Signed)
Sydney Moore presents today for followup visit. She continues to have some occasional episodes of chest discomfort. She occasionally takes nitroglycerin. These episodes do not last very long.  We will increase her Ranexa to 1000 mg twice a day. We will have her return for an EKG/nurse visit in one month. We will check her fasting lipids, liver enzymes, and basic metabolic profile today. I'll see her again in 6 months for followup office visit with fasting labs.

## 2012-08-06 NOTE — Progress Notes (Signed)
Sydney Moore Date of Birth  [birth year] [facility] / [facility] [address]     [address] [place], [place]  [address] [phone]  Fax  [phone]   Problems: 1. CAD - s/p CABG 2. Pacer - for advanced AV block 3. PVD- left carotid bruit 4. CHF - EF of 40% 5. Diabetes Mellitus 6. Hypothyroidism   History of Present Illness:  79 year old female with a history of coronary artery disease and pacemaker implant.  She has a history of peripheral vascular disease and has a known left carotid bruit which we are following.   She's had a history of coronary artery bypass grafting. She denies any shortness of breath or chest pain. She does complain of some leg weakness particularly with walking.  Check pacer generator changed in October. She has developed some keloid formation over the pacer incision  A recent echocardiogram reveals mild to moderate left ventricular dysfunction with an ejection fraction of 40%.  There is moderate mitral regurgitation and moderate tricuspid regurgitation. Right ventricle appears to be dilated. Her pulmonary pressures are at the upper limits of normal at 36 mmHg. Her Lasix dose was increased by Dr. Ladona Ridgel and she's feeling a bit better.  Septeber 17, 2013 - she has done well.  She has occasional episodes of CP when walking - goes away very quickly with a SL NTG.  These episodes of chest pain are very rare. She's able to do most of her normal activities without any significant problems. She has known stenosis in her ramus intermediate branch that was not amenable to PCI. We were not able to put a wire down that vessel. At that point she was started on Ranexa and has done well since that time.  June 27 ,2014:  [name] is doing well.  She has occasional CP and takes NTG occasionally .    Current Outpatient Prescriptions on File Prior to Visit  Medication Sig Dispense Refill  . amLODipine (NORVASC) 5 MG tablet Take 1 tablet (5 mg  total) by mouth daily.  30 tablet  5  . aspirin 81 MG tablet Take 81 mg by mouth every morning.        Marland Kitchen atorvastatin (LIPITOR) 20 MG tablet Take 1 tablet (20 mg total) by mouth daily.  30 tablet  11  . calcium-vitamin D (OSCAL WITH D) 500-200 MG-UNIT per tablet Take 1 tablet by mouth daily.        . clobetasol cream (TEMOVATE) 0.05 % Apply 1 application topically as needed. Apply to affected on skin twice a day as needed for rash      . clopidogrel (PLAVIX) 75 MG tablet Take 75 mg by mouth daily.       Marland Kitchen esomeprazole (NEXIUM) 40 MG capsule Take 40 mg by mouth daily before breakfast.      . furosemide (LASIX) 40 MG tablet Take 1 tablet (40 mg total) by mouth daily.  30 tablet  11  . isosorbide mononitrate (IMDUR) 60 MG 24 hr tablet Take 1.5 tablets (90 mg total) by mouth daily.  45 tablet  6  . levothyroxine (SYNTHROID, LEVOTHROID) 50 MCG tablet Take 50 mcg by mouth daily.        Marland Kitchen losartan (COZAAR) 50 MG tablet Take 2 tablets (100 mg total) by mouth daily.  30 tablet  6  . metFORMIN (GLUCOPHAGE) 500 MG tablet Take 250 mg by mouth at bedtime.      . metoprolol succinate (TOPROL-XL) 50 MG 24  hr tablet Take 50 mg by mouth daily. Take with or immediately following a meal.      . metroNIDAZOLE (METROGEL) 1 % gel Apply 1 application topically daily as needed (for vaginal).      Marland Kitchen NITROSTAT 0.4 MG SL tablet PLACE 1 TABLET UNDER THE TONGUE EVERY 5 MINUTES AS NEEDED FOR CHEST PAIN  90 each  3  . pantoprazole (PROTONIX) 40 MG tablet Take 1 tablet (40 mg total) by mouth daily.  30 tablet  3  . potassium chloride SA (K-DUR,KLOR-CON) 20 MEQ tablet Take 20 mEq by mouth every evening.      . ranolazine (RANEXA) 500 MG 12 hr tablet Take 500 mg by mouth 2 (two) times daily.        No current facility-administered medications on file prior to visit.    No Known Allergies  Past Medical History  Diagnosis Date  . Hypertension   . Hyperlipidemia   . Diabetes mellitus   . Chronic cough     Seen by Dr. Sherene Sires   . Hypothyroid   . CAD (coronary artery disease) 1989    a. 1989: s/p CABGx3;  b. 06/2007 Cath/Attempted PCI: LM min irregs, LAD 90/100, D1 90 (attempted PCI), RI 90, LCX100p, OM1 100, RCA 60/70p, 90/26m, VG->RCA nl, VG->OM 100, LIMA->LAD nl, EF 50%.  . Complete heart block     a. s/p PPM (MDT) by Dr Deborah Chalk 2004; b. Gen change 09/2010 (MDT ADDRL1 Adapta DC PPM Ser #:[id number]  . Carotid artery occlusion 2011    60-79%right/ 40-59% left  . GERD (gastroesophageal reflux disease)   . Chronic systolic CHF (congestive heart failure)     a. 03/2011 Echo: EF 40% inflat HK, Gr1 DD, mod MR/TR  . Chest pain March 2014    Past Surgical History  Procedure Laterality Date  . Coronary artery bypass graft  1989  . Pacemaker placement  2004    MDT by Dr Deborah Chalk  . Cataract extraction  2004    Both  . Cardiac catheterization  2004, 2007, 2009  . Eye surgery      History  Smoking status  . Former Smoker  . Quit date: 12/15/1973  Smokeless tobacco  . Never Used    History  Alcohol Use No    Family History  Problem Relation Age of Onset  . Hypertension Mother   . Liver cancer Brother   . Breast cancer Daughter   . Colon cancer Brother   . Cancer Brother     colon    Reviw of Systems:  Reviewed in the HPI.  All other systems are negative.  Physical Exam: BP 120/68  Pulse 86  Ht 5\' 1"  (1.549 m)  Wt 128 lb 1.9 oz (58.115 kg)  BMI 24.22 kg/m2  SpO2 96% The patient is alert and oriented x 3.  The mood and affect are normal.   Skin: warm and dry.  Color is normal.    HEENT:   the sclera are nonicteric.  The mucous membranes are moist.  The carotids are 2+ with soft bruits. .  There is no thyromegaly.  Pulsatile next is inferior to her carotid arteries that are either do to a large V wave or perhaps do to her common carotid artery.  Lungs: clear.  The chest wall is non tender.    Heart: regular rate with a normal S1 and S2.  There is a soft systolic murmur The PMI is not  displaced.  Her pacer is in the  right subclavian and has a small amount of keloid.   Abdomen: good bowel sounds.  There is no guarding or rebound.  There is no hepatosplenomegaly or tenderness.  There are no masses.   Extremities:  no clubbing, cyanosis, or edema.  The legs are without rashes.  The distal pulses are diminished  Neuro:  Cranial nerves II - XII are intact.  Motor and sensory functions are intact.    The gait is normal.  ECG:     Assessment / Plan:

## 2012-08-06 NOTE — Patient Instructions (Addendum)
Your physician recommends that you return for lab work in: today bmet lipid hepatic  Your physician has recommended you make the following change in your medication:   Increase ranexa to 1000 mg twice daily 12 hours apart   Your physician recommends that you schedule a follow-up appointment in: 1 month for a nurse visit//ekg   Your physician wants you to follow-up in: 6 months  You will receive a reminder letter in the mail two months in advance. If you don't receive a letter, please call our office to schedule the follow-up appointment.

## 2012-08-06 NOTE — Assessment & Plan Note (Signed)
She seems to be very stable. Continue current medications.

## 2012-09-08 ENCOUNTER — Ambulatory Visit (INDEPENDENT_AMBULATORY_CARE_PROVIDER_SITE_OTHER): Payer: Medicare Other | Admitting: *Deleted

## 2012-09-08 ENCOUNTER — Encounter: Payer: Self-pay | Admitting: *Deleted

## 2012-09-08 VITALS — BP 104/52 | HR 71 | Ht 61.0 in | Wt 130.8 lb

## 2012-09-08 DIAGNOSIS — I25810 Atherosclerosis of coronary artery bypass graft(s) without angina pectoris: Secondary | ICD-10-CM

## 2012-09-10 LAB — HM DIABETES EYE EXAM

## 2012-10-08 ENCOUNTER — Encounter: Payer: Self-pay | Admitting: Internal Medicine

## 2012-10-08 ENCOUNTER — Ambulatory Visit (INDEPENDENT_AMBULATORY_CARE_PROVIDER_SITE_OTHER): Payer: Medicare Other | Admitting: Internal Medicine

## 2012-10-08 ENCOUNTER — Other Ambulatory Visit (INDEPENDENT_AMBULATORY_CARE_PROVIDER_SITE_OTHER): Payer: Medicare Other

## 2012-10-08 VITALS — BP 112/60 | HR 78 | Temp 97.1°F | Wt 128.4 lb

## 2012-10-08 DIAGNOSIS — I5042 Chronic combined systolic (congestive) and diastolic (congestive) heart failure: Secondary | ICD-10-CM

## 2012-10-08 DIAGNOSIS — I10 Essential (primary) hypertension: Secondary | ICD-10-CM

## 2012-10-08 DIAGNOSIS — E119 Type 2 diabetes mellitus without complications: Secondary | ICD-10-CM

## 2012-10-08 DIAGNOSIS — E039 Hypothyroidism, unspecified: Secondary | ICD-10-CM

## 2012-10-08 DIAGNOSIS — I509 Heart failure, unspecified: Secondary | ICD-10-CM

## 2012-10-08 DIAGNOSIS — Z23 Encounter for immunization: Secondary | ICD-10-CM

## 2012-10-08 LAB — HEMOGLOBIN A1C: Hgb A1c MFr Bld: 6 % (ref 4.6–6.5)

## 2012-10-08 LAB — MICROALBUMIN / CREATININE URINE RATIO: Microalb Creat Ratio: 1.9 mg/g (ref 0.0–30.0)

## 2012-10-08 MED ORDER — LOSARTAN POTASSIUM 50 MG PO TABS: 50.0000 mg | ORAL_TABLET | Freq: Every day | ORAL | Status: DC

## 2012-10-08 MED ORDER — CLOBETASOL PROPIONATE 0.05 % EX CREA: 1.0000 "application " | TOPICAL_CREAM | CUTANEOUS | Status: DC | PRN

## 2012-10-08 MED ORDER — METFORMIN HCL 500 MG PO TABS: 250.0000 mg | ORAL_TABLET | Freq: Every day | ORAL | Status: DC

## 2012-10-08 NOTE — Patient Instructions (Signed)
It was good to see you today. We have reviewed your prior records including labs and tests today Test(s) ordered today. Your results will be released to MyChart (or called to you) after review, usually within 72hours after test completion. If any changes need to be made, you will be notified at that same time. Medications reviewed and updated, reduce losartan to one pill (50mg ) once daily - no  otherchanges recommended at this time. Refill on medication(s) as discussed today. Please schedule followup in 6 months, call sooner if problems.  Your annual flu shot was given and/or updated today.

## 2012-10-08 NOTE — Assessment & Plan Note (Signed)
Lab Results  Component Value Date   TSH 3.37 04/09/2012   The current medical regimen is effective;  continue present plan and medications.

## 2012-10-08 NOTE — Progress Notes (Signed)
  Subjective:    Patient ID: Sydney Moore, female    DOB: 1923-11-16, 79 y.o.   MRN: [id number]  HPI Here for follow up - reviewed chronic medical issues   Past Medical History  Diagnosis Date  . Hypertension   . Hyperlipidemia   . Diabetes mellitus   . Chronic cough     Seen by Dr. Sherene Sires  . Hypothyroid   . CAD (coronary artery disease) 1989    a. 1989: s/p CABGx3;  b. 06/2007 Cath/Attempted PCI: LM min irregs, LAD 90/100, D1 90 (attempted PCI), RI 90, LCX100p, OM1 100, RCA 60/70p, 90/49m, VG->RCA nl, VG->OM 100, LIMA->LAD nl, EF 50%.  . Complete heart block     a. s/p PPM (MDT) by Dr Deborah Chalk 2004; b. Gen change 09/2010 (MDT ADDRL1 Adapta DC PPM Ser #:[id number]  . Carotid artery occlusion 2011    60-79%right/ 40-59% left  . GERD (gastroesophageal reflux disease)   . Chronic systolic CHF (congestive heart failure)     a. 03/2011 Echo: EF 40% inflat HK, Gr1 DD, mod MR/TR  . Chest pain March 2014    Review of Systems  Constitutional: Positive for fatigue. Negative for fever and unexpected weight change.  Eyes: Negative for visual disturbance.  Respiratory: Negative for cough and shortness of breath.   Cardiovascular: Negative for chest pain, palpitations and leg swelling.       Objective:   Physical Exam BP 112/60  Pulse 78  Temp(Src) 97.1 F (36.2 C) (Oral)  Wt 128 lb 6.4 oz (58.242 kg)  BMI 24.27 kg/m2  SpO2 97% Wt Readings from Last 3 Encounters:  10/08/12 128 lb 6.4 oz (58.242 kg)  09/08/12 130 lb 12.8 oz (59.33 kg)  08/06/12 128 lb 1.9 oz (58.115 kg)   Constitutional: She appears well-developed and well-nourished. No distress.   Neck: Normal range of motion. Neck supple. No JVD present. No thyromegaly present.  Cardiovascular: Normal rate, regular rhythm and normal heart sounds.  2/6 systolic murmur heard. No BLE edema. Pulmonary/Chest: Effort normal and breath sounds normal. No respiratory distress. She has no wheezes. Neurological: She is alert and oriented to  person, place, and time. No cranial nerve deficit. Coordination normal. Psychiatric: She has a normal mood and affect. Her behavior is normal. Judgment and thought content normal.     Lab Results  Component Value Date   WBC 4.6 04/28/2012   HGB 12.0 04/28/2012   HCT 34.4* 04/28/2012   PLT 196 04/28/2012   GLUCOSE 97 08/06/2012   CHOL 157 08/06/2012   TRIG 121.0 08/06/2012   HDL 43.70 08/06/2012   LDLDIRECT 58 12/18/2008   LDLCALC 89 08/06/2012   ALT 20 08/06/2012   AST 31 08/06/2012   NA 138 08/06/2012   K 3.7 08/06/2012   CL 103 08/06/2012   CREATININE 1.0 08/06/2012   BUN 17 08/06/2012   CO2 26 08/06/2012   TSH 3.37 04/09/2012   INR 1.02 04/28/2012   HGBA1C 6.3 04/09/2012       Assessment & Plan:   See problem list. Medications and labs reviewed today.

## 2012-10-08 NOTE — Assessment & Plan Note (Signed)
Lab Results  Component Value Date   HGBA1C 6.3 04/09/2012   On low dose metformin - intermittent use of same because of diarrhea side effects Consider change to Januvia or other class of medication if a1c greater than 7 On statin, aspirin and ARB

## 2012-10-08 NOTE — Assessment & Plan Note (Signed)
BP Readings from Last 3 Encounters:  10/08/12 112/60  09/08/12 104/52  08/06/12 120/68  will reduce ARB dose due to nocturnal symptoms of dizzy, ?presyncope Pt reports BP 90s at that time Will continue to monitor BP and cbgs with symptoms and call if either out of range

## 2012-10-08 NOTE — Assessment & Plan Note (Signed)
Euvolemic, no angina Follows with cards for same The current medical regimen is effective;  continue present plan and medications.  

## 2012-11-03 ENCOUNTER — Telehealth: Payer: Self-pay | Admitting: Cardiovascular Disease

## 2012-11-03 NOTE — Telephone Encounter (Signed)
New Problem:  Pt states she has been feeling dizzy on and off for about a week. Pt also states her BP has been running low. Pt states her BP has been running around 120/70 or 111/70. Please advise

## 2012-11-03 NOTE — Telephone Encounter (Signed)
C/o dizziness when turning head side to side, ears are a  bit plugged per pt, and dizzy sometimes when she gets up. bp 127/60, p 80's Pt reassured bp and pulse in good range,  Pt told to see pcp, pt agreed to plan.

## 2012-11-05 ENCOUNTER — Encounter: Payer: Self-pay | Admitting: Internal Medicine

## 2012-11-05 ENCOUNTER — Ambulatory Visit (INDEPENDENT_AMBULATORY_CARE_PROVIDER_SITE_OTHER): Payer: Medicare Other | Admitting: Internal Medicine

## 2012-11-05 VITALS — BP 120/60 | HR 80 | Temp 97.7°F | Wt 128.8 lb

## 2012-11-05 DIAGNOSIS — H8110 Benign paroxysmal vertigo, unspecified ear: Secondary | ICD-10-CM

## 2012-11-05 DIAGNOSIS — E119 Type 2 diabetes mellitus without complications: Secondary | ICD-10-CM

## 2012-11-05 MED ORDER — MECLIZINE HCL 12.5 MG PO TABS: 12.5000 mg | ORAL_TABLET | Freq: Three times a day (TID) | ORAL | Status: DC | PRN

## 2012-11-05 NOTE — Patient Instructions (Addendum)
It was good to see you today. We have reviewed your prior records including labs and tests today Medications reviewed and updated,  Use meclizine 3x/day as needed for dizzy symptoms Stop metformin - no other changes recommended at this time. Please schedule followup in 6 months, call sooner if problems.Vertigo Vertigo means you feel like you or your surroundings are moving when they are not. Vertigo can be dangerous if it occurs when you are at work, driving, or performing difficult activities.  CAUSES  Vertigo occurs when there is a conflict of signals sent to your brain from the visual and sensory systems in your body. There are many different causes of vertigo, including:  Infections, especially in the inner ear.  A bad reaction to a drug or misuse of alcohol and medicines.  Withdrawal from drugs or alcohol.  Rapidly changing positions, such as lying down or rolling over in bed.  A migraine headache.  Decreased blood flow to the brain.  Increased pressure in the brain from a head injury, infection, tumor, or bleeding. SYMPTOMS  You may feel as though the world is spinning around or you are falling to the ground. Because your balance is upset, vertigo can cause nausea and vomiting. You may have involuntary eye movements (nystagmus). DIAGNOSIS  Vertigo is usually diagnosed by physical exam. If the cause of your vertigo is unknown, your caregiver may perform imaging tests, such as an MRI scan (magnetic resonance imaging). TREATMENT  Most cases of vertigo resolve on their own, without treatment. Depending on the cause, your caregiver may prescribe certain medicines. If your vertigo is related to body position issues, your caregiver may recommend movements or procedures to correct the problem. In rare cases, if your vertigo is caused by certain inner ear problems, you may need surgery. HOME CARE INSTRUCTIONS   Follow your caregiver's instructions.  Avoid driving.  Avoid operating  heavy machinery.  Avoid performing any tasks that would be dangerous to you or others during a vertigo episode.  Tell your caregiver if you notice that certain medicines seem to be causing your vertigo. Some of the medicines used to treat vertigo episodes can actually make them worse in some people. SEEK IMMEDIATE MEDICAL CARE IF:   Your medicines do not relieve your vertigo or are making it worse.  You develop problems with talking, walking, weakness, or using your arms, hands, or legs.  You develop severe headaches.  Your nausea or vomiting continues or gets worse.  You develop visual changes.  A family member notices behavioral changes.  Your condition gets worse. MAKE SURE YOU:  Understand these instructions.  Will watch your condition.  Will get help right away if you are not doing well or get worse. Document Released: 11/06/2004 Document Revised: 04/21/2011 Document Reviewed: 08/15/2010 [facility] Patient Information 2014 [place], [place].

## 2012-11-05 NOTE — Progress Notes (Signed)
Subjective:    Patient ID: Sydney Moore, female    DOB: 03-10-1923, 79 y.o.   MRN: [id number]  HPI Here for follow up - reviewed chronic medical issues  Also complains of dizzy x 1 week - see phone call to cards re: same symptoms positional, esp bad getting out of bed Also worse with "low" sugar (cbg 80s)   Past Medical History  Diagnosis Date  . Hypertension   . Hyperlipidemia   . Diabetes mellitus   . Chronic cough     Seen by Dr. Sherene Sires  . Hypothyroid   . CAD (coronary artery disease) 1989    a. 1989: s/p CABGx3;  b. 06/2007 Cath/Attempted PCI: LM min irregs, LAD 90/100, D1 90 (attempted PCI), RI 90, LCX100p, OM1 100, RCA 60/70p, 90/72m, VG->RCA nl, VG->OM 100, LIMA->LAD nl, EF 50%.  . Complete heart block     a. s/p PPM (MDT) by Dr Deborah Chalk 2004; b. Gen change 09/2010 (MDT ADDRL1 Adapta DC PPM Ser #:[id number]  . Carotid artery occlusion 2011    60-79%right/ 40-59% left  . GERD (gastroesophageal reflux disease)   . Chronic systolic CHF (congestive heart failure)     a. 03/2011 Echo: EF 40% inflat HK, Gr1 DD, mod MR/TR  . Chest pain March 2014    Review of Systems  Constitutional: Positive for fatigue. Negative for fever and unexpected weight change.  Eyes: Negative for visual disturbance.  Respiratory: Negative for cough and shortness of breath.   Cardiovascular: Negative for chest pain, palpitations and leg swelling.  Neurological: Positive for dizziness and light-headedness. Negative for seizures, facial asymmetry, weakness, numbness and headaches.       Objective:   Physical Exam BP 120/60  Pulse 80  Temp(Src) 97.7 F (36.5 C) (Oral)  Wt 128 lb 12.8 oz (58.423 kg)  BMI 24.35 kg/m2  SpO2 97% Wt Readings from Last 3 Encounters:  11/05/12 128 lb 12.8 oz (58.423 kg)  10/08/12 128 lb 6.4 oz (58.242 kg)  09/08/12 130 lb 12.8 oz (59.33 kg)   Constitutional: She appears well-developed and well-nourished. No distress.  HENT: TMs clear without effusion or erythema -  no cerumen, nontender sinus Neck: Normal range of motion. Neck supple. No JVD present. No thyromegaly present.  Cardiovascular: Normal rate, regular rhythm and normal heart sounds.  2/6 systolic murmur heard. No BLE edema. Pulmonary/Chest: Effort normal and breath sounds normal. No respiratory distress. She has no wheezes. Neurological: She is alert and oriented to person, place, and time. No cranial nerve deficit. Coordination, balance, gait and speech are normal. Psychiatric: She has a normal mood and affect. Her behavior is normal. Judgment and thought content normal.    Lab Results  Component Value Date   WBC 4.6 04/28/2012   HGB 12.0 04/28/2012   HCT 34.4* 04/28/2012   PLT 196 04/28/2012   GLUCOSE 97 08/06/2012   CHOL 157 08/06/2012   TRIG 121.0 08/06/2012   HDL 43.70 08/06/2012   LDLDIRECT 58 12/18/2008   LDLCALC 89 08/06/2012   ALT 20 08/06/2012   AST 31 08/06/2012   NA 138 08/06/2012   K 3.7 08/06/2012   CL 103 08/06/2012   CREATININE 1.0 08/06/2012   BUN 17 08/06/2012   CO2 26 08/06/2012   TSH 3.37 04/09/2012   INR 1.02 04/28/2012   HGBA1C 6.0 10/08/2012   MICROALBUR 1.0 10/08/2012       Assessment & Plan:   BPPV - positional vertigo - ENT and neuro exam benign - HD stable and  no cardiac symptoms tx with low dose meclizine  Also ?"hypoglycemia" manifest with dizzy symptoms when cbgs 80s... Stop metformin - see DM below See problem list. Medications and labs reviewed today.

## 2012-11-06 NOTE — Assessment & Plan Note (Signed)
Lab Results  Component Value Date   HGBA1C 6.0 10/08/2012   On low dose metformin - intermittent use of same because of diarrhea side effects Stop same now due to ?hypoglycemia (cbg 80s) causing dizziness Consider Januvia or other class of medication if a1c > 7 in future On statin, aspirin and ARB

## 2012-11-24 ENCOUNTER — Encounter: Payer: Self-pay | Admitting: Internal Medicine

## 2012-11-24 ENCOUNTER — Ambulatory Visit (INDEPENDENT_AMBULATORY_CARE_PROVIDER_SITE_OTHER): Payer: Medicare Other | Admitting: Internal Medicine

## 2012-11-24 VITALS — BP 111/62 | HR 96 | Ht 63.0 in | Wt 129.4 lb

## 2012-11-24 DIAGNOSIS — I2510 Atherosclerotic heart disease of native coronary artery without angina pectoris: Secondary | ICD-10-CM

## 2012-11-24 DIAGNOSIS — I10 Essential (primary) hypertension: Secondary | ICD-10-CM

## 2012-11-24 DIAGNOSIS — I442 Atrioventricular block, complete: Secondary | ICD-10-CM

## 2012-11-24 LAB — PACEMAKER DEVICE OBSERVATION
AL IMPEDENCE PM: 455 Ohm
ATRIAL PACING PM: 92
BATTERY VOLTAGE: 2.79 V
VENTRICULAR PACING PM: 100

## 2012-11-24 NOTE — Progress Notes (Signed)
PCP: Rene Paci, MD Primary Cardiologist:  Dr Nona Dell is a 79 y.o. female who presents today for routine electrophysiology followup.  Since last being seen in our clinic, the patient reports doing very well.  She has stable canadian class II angina for which she is followed closely by Dr Elease Hashimoto.  She remains very active in her church.  She reports that she had difficulty with hypotension about 2 months ago and did have an episode of postural syncope.  Her Cozaar has been decreased by Dr Felicity Coyer and she has done much better since.  Today, she denies symptoms of palpitations, shortness of breath, or lower extremity edema.  The patient is otherwise without complaint today.   Past Medical History  Diagnosis Date  . Hypertension   . Hyperlipidemia   . Diabetes mellitus   . Chronic cough     Seen by Dr. Sherene Sires  . Hypothyroid   . CAD (coronary artery disease) 1989    a. 1989: s/p CABGx3;  b. 06/2007 Cath/Attempted PCI: LM min irregs, LAD 90/100, D1 90 (attempted PCI), RI 90, LCX100p, OM1 100, RCA 60/70p, 90/82m, VG->RCA nl, VG->OM 100, LIMA->LAD nl, EF 50%.  . Complete heart block     a. s/p PPM (MDT) by Dr Deborah Chalk 2004; b. Gen change 09/2010 (MDT ADDRL1 Adapta DC PPM Ser #:[id number]  . Carotid artery occlusion 2011    60-79%right/ 40-59% left  . GERD (gastroesophageal reflux disease)   . Chronic systolic CHF (congestive heart failure)     a. 03/2011 Echo: EF 40% inflat HK, Gr1 DD, mod MR/TR  . Chest pain March 2014   Past Surgical History  Procedure Laterality Date  . Coronary artery bypass graft  1989  . Pacemaker placement  2004    MDT by Dr Deborah Chalk  . Cataract extraction  2004    Both  . Cardiac catheterization  2004, 2007, 2009  . Eye surgery      Current Outpatient Prescriptions  Medication Sig Dispense Refill  . amLODipine (NORVASC) 5 MG tablet Take 1 tablet (5 mg total) by mouth daily.  30 tablet  5  . aspirin 81 MG tablet Take 81 mg by mouth every morning.         Marland Kitchen atorvastatin (LIPITOR) 20 MG tablet Take 1 tablet (20 mg total) by mouth daily.  30 tablet  11  . calcium-vitamin D (OSCAL WITH D) 500-200 MG-UNIT per tablet Take 1 tablet by mouth daily.        . clobetasol cream (TEMOVATE) 0.05 % Apply 1 application topically as needed. Apply to affected on skin twice a day as needed for rash  30 g  0  . clopidogrel (PLAVIX) 75 MG tablet Take 75 mg by mouth daily.       Marland Kitchen esomeprazole (NEXIUM) 40 MG capsule Take 40 mg by mouth daily before breakfast.      . furosemide (LASIX) 40 MG tablet Take 1 tablet (40 mg total) by mouth daily.  30 tablet  11  . isosorbide mononitrate (IMDUR) 60 MG 24 hr tablet Take 1.5 tablets (90 mg total) by mouth daily.  45 tablet  6  . levothyroxine (SYNTHROID, LEVOTHROID) 50 MCG tablet Take 50 mcg by mouth daily.        Marland Kitchen losartan (COZAAR) 50 MG tablet Take 1 tablet (50 mg total) by mouth daily.  30 tablet  6  . meclizine (ANTIVERT) 12.5 MG tablet Take 1 tablet (12.5 mg total) by mouth 3 (three)  times daily as needed for dizziness.  30 tablet  0  . metoprolol succinate (TOPROL-XL) 50 MG 24 hr tablet Take 50 mg by mouth daily. Take with or immediately following a meal.      . metroNIDAZOLE (METROGEL) 1 % gel Apply 1 application topically daily as needed (for vaginal).      Marland Kitchen NITROSTAT 0.4 MG SL tablet PLACE 1 TABLET UNDER THE TONGUE EVERY 5 MINUTES AS NEEDED FOR CHEST PAIN  90 each  3  . potassium chloride SA (K-DUR,KLOR-CON) 20 MEQ tablet Take 20 mEq by mouth every evening.      . ranolazine (RANEXA) 1000 MG SR tablet Take 1 tablet (1,000 mg total) by mouth 2 (two) times daily.  60 tablet  6   No current facility-administered medications for this visit.    Physical Exam: Filed Vitals:   11/24/12 1018  BP: 111/62  Pulse: 96  Height: 5\' 3"  (1.6 m)  Weight: 129 lb 6.4 oz (58.695 kg)    GEN- The patient is well appearing, alert and oriented x 3 today.   Head- normocephalic, atraumatic Eyes-  Sclera clear, conjunctiva  pink Ears- hearing intact Oropharynx- clear Lungs- Clear to ausculation bilaterally, normal work of breathing Chest- pacemaker pocket is well healed Heart- Regular rate and rhythm, no murmurs, rubs or gallops, PMI not laterally displaced GI- soft, NT, ND, + BS Extremities- no clubbing, cyanosis, or edema  Pacemaker interrogation- reviewed in detail today,  See PACEART report  Assessment and Plan:  Atrioventricular block, complete  Normal pacemaker function See Pace Art report No changes today  HYPERTENSION, BENIGN ESSENTIAL   At goal today No changes today   ATHEROSCLEROSIS, CORONARY, BYPASS GRAFT NOS   Stable  No change required today  carelink Return to see Nehemiah Settle in 1 year in the device clinic

## 2012-11-24 NOTE — Patient Instructions (Signed)
Your physician wants you to follow-up in: Sharrell Ku You will receive a reminder letter in the mail two months in advance. If you don't receive a letter, please call our office to schedule the follow-up appointment.  Remote monitoring is used to monitor your Pacemaker or ICD from home. This monitoring reduces the number of office visits required to check your device to one time per year. It allows [place] to keep an eye on the functioning of your device to ensure it is working properly. You are scheduled for a device check from home on 0119/15. You may send your transmission at any time that day. If you have a wireless device, the transmission will be sent automatically. After your physician reviews your transmission, you will receive a postcard with your next transmission date.

## 2012-11-25 ENCOUNTER — Other Ambulatory Visit: Payer: Self-pay | Admitting: Internal Medicine

## 2012-12-03 ENCOUNTER — Telehealth: Payer: Self-pay | Admitting: Internal Medicine

## 2012-12-03 NOTE — Telephone Encounter (Signed)
New Problem  Pt was advised to call in if she did not have the device to check her pacemaker remotely.. She states that she does not have one and requests to have one ordered.. Please call.

## 2012-12-03 NOTE — Telephone Encounter (Signed)
Ordered new Carelink for pt. Pt aware that this will be shipped in next 2 weeks.

## 2012-12-15 ENCOUNTER — Other Ambulatory Visit: Payer: Self-pay | Admitting: *Deleted

## 2012-12-15 MED ORDER — LOSARTAN POTASSIUM 50 MG PO TABS: 50.0000 mg | ORAL_TABLET | Freq: Every day | ORAL | Status: DC

## 2012-12-17 ENCOUNTER — Other Ambulatory Visit: Payer: Self-pay | Admitting: *Deleted

## 2012-12-17 MED ORDER — LOSARTAN POTASSIUM 50 MG PO TABS: 50.0000 mg | ORAL_TABLET | Freq: Every day | ORAL | Status: DC

## 2013-01-24 ENCOUNTER — Ambulatory Visit: Payer: Medicare Other | Admitting: Cardiovascular Disease

## 2013-02-28 ENCOUNTER — Encounter: Payer: Medicare Other | Admitting: *Deleted

## 2013-03-08 ENCOUNTER — Encounter: Payer: Self-pay | Admitting: *Deleted

## 2013-03-08 ENCOUNTER — Other Ambulatory Visit: Payer: Self-pay | Admitting: *Deleted

## 2013-03-08 DIAGNOSIS — E785 Hyperlipidemia, unspecified: Secondary | ICD-10-CM

## 2013-03-17 ENCOUNTER — Ambulatory Visit (INDEPENDENT_AMBULATORY_CARE_PROVIDER_SITE_OTHER): Payer: Medicare Other | Admitting: *Deleted

## 2013-03-17 DIAGNOSIS — I442 Atrioventricular block, complete: Secondary | ICD-10-CM

## 2013-03-21 ENCOUNTER — Other Ambulatory Visit: Payer: Medicare Other

## 2013-03-21 ENCOUNTER — Encounter: Payer: Self-pay | Admitting: Cardiovascular Disease

## 2013-03-21 ENCOUNTER — Encounter (INDEPENDENT_AMBULATORY_CARE_PROVIDER_SITE_OTHER): Payer: Self-pay

## 2013-03-21 ENCOUNTER — Ambulatory Visit (INDEPENDENT_AMBULATORY_CARE_PROVIDER_SITE_OTHER): Payer: Medicare Other | Admitting: Cardiovascular Disease

## 2013-03-21 VITALS — BP 150/70 | HR 58 | Ht 63.0 in | Wt 129.0 lb

## 2013-03-21 DIAGNOSIS — I2510 Atherosclerotic heart disease of native coronary artery without angina pectoris: Secondary | ICD-10-CM

## 2013-03-21 DIAGNOSIS — E785 Hyperlipidemia, unspecified: Secondary | ICD-10-CM

## 2013-03-21 DIAGNOSIS — I5042 Chronic combined systolic (congestive) and diastolic (congestive) heart failure: Secondary | ICD-10-CM

## 2013-03-21 DIAGNOSIS — I509 Heart failure, unspecified: Secondary | ICD-10-CM

## 2013-03-21 LAB — LIPID PANEL
CHOL/HDL RATIO: 3
CHOLESTEROL: 142 mg/dL (ref 0–200)
HDL: 53.1 mg/dL (ref >39.00)
LDL Cholesterol: 72 mg/dL (ref 0–99)
TRIGLYCERIDES: 84 mg/dL (ref 0.0–149.0)
VLDL: 16.8 mg/dL (ref 0.0–40.0)

## 2013-03-21 LAB — HEPATIC FUNCTION PANEL
ALT: 20 U/L (ref 0–35)
AST: 35 U/L (ref 0–37)
Albumin: 4.4 g/dL (ref 3.5–5.2)
Alkaline Phosphatase: 47 U/L (ref 39–117)
Bilirubin, Direct: 0.1 mg/dL (ref 0.0–0.3)
TOTAL PROTEIN: 7.7 g/dL (ref 6.0–8.3)
Total Bilirubin: 0.8 mg/dL (ref 0.3–1.2)

## 2013-03-21 LAB — BASIC METABOLIC PANEL
BUN: 15 mg/dL (ref 6–23)
CO2: 28 meq/L (ref 19–32)
Calcium: 9.3 mg/dL (ref 8.4–10.5)
Chloride: 103 mEq/L (ref 96–112)
Creatinine, Ser: 0.9 mg/dL (ref 0.4–1.2)
GFR: 74.19 mL/min (ref >60.00)
Glucose, Bld: 115 mg/dL — ABNORMAL HIGH (ref 70–99)
POTASSIUM: 3.9 meq/L (ref 3.5–5.1)
Sodium: 139 mEq/L (ref 135–145)

## 2013-03-21 MED ORDER — ISOSORBIDE MONONITRATE ER 60 MG PO TB24: 90.0000 mg | ORAL_TABLET | Freq: Every day | ORAL | Status: DC

## 2013-03-21 MED ORDER — LOSARTAN POTASSIUM 50 MG PO TABS: 50.0000 mg | ORAL_TABLET | Freq: Every day | ORAL | Status: DC

## 2013-03-21 MED ORDER — RANOLAZINE ER 1000 MG PO TB12: 1000.0000 mg | ORAL_TABLET | Freq: Two times a day (BID) | ORAL | Status: DC

## 2013-03-21 MED ORDER — METOPROLOL SUCCINATE ER 50 MG PO TB24: 50.0000 mg | ORAL_TABLET | Freq: Every day | ORAL | Status: DC

## 2013-03-21 MED ORDER — AMLODIPINE BESYLATE 5 MG PO TABS: 5.0000 mg | ORAL_TABLET | Freq: Every day | ORAL | Status: DC

## 2013-03-21 MED ORDER — ATORVASTATIN CALCIUM 20 MG PO TABS: 20.0000 mg | ORAL_TABLET | Freq: Every day | ORAL | Status: DC

## 2013-03-21 MED ORDER — NITROGLYCERIN 0.4 MG SL SUBL: SUBLINGUAL_TABLET | SUBLINGUAL | Status: DC

## 2013-03-21 MED ORDER — POTASSIUM CHLORIDE CRYS ER 20 MEQ PO TBCR: 20.0000 meq | EXTENDED_RELEASE_TABLET | Freq: Every evening | ORAL | Status: DC

## 2013-03-21 MED ORDER — CLOPIDOGREL BISULFATE 75 MG PO TABS: 75.0000 mg | ORAL_TABLET | Freq: Every day | ORAL | Status: DC

## 2013-03-21 MED ORDER — FUROSEMIDE 40 MG PO TABS: ORAL_TABLET | ORAL | Status: DC

## 2013-03-21 NOTE — Assessment & Plan Note (Addendum)
Sydney Moore is doing very well. She has stable angina. She has known disease in the ramus branch. She does okay as long she takes a nitroglycerin.  She will be getting her medication through [place] instead of [facility].  She'll just need to go down to [facility] to get her medications. We'll give her prescriptions printed out.

## 2013-03-21 NOTE — Patient Instructions (Signed)
Your physician wants you to follow-up in: 6 months  You will receive a reminder letter in the mail two months in advance. If you don't receive a letter, please call our office to schedule the follow-up appointment.  Your physician recommends that you return for lab work in: today    

## 2013-03-21 NOTE — Assessment & Plan Note (Signed)
Stable.  Sees Dr. Johney Frame.

## 2013-03-21 NOTE — Progress Notes (Signed)
Sydney Moore Date of Birth  02/15/1923 [name] [address]     [address] [place]  [id number] [phone]  Fax  [phone]   Problems: 1. CAD - s/p CABG 2. Pacer - for advanced AV block 3. PVD- left carotid bruit 4. CHF - EF of 40% 5. Diabetes Mellitus 6. Hypothyroidism   History of Present Illness:  80 year old female with a history of coronary artery disease and pacemaker implant.  She has a history of peripheral vascular disease and has a known left carotid bruit which we are following.   She's had a history of coronary artery bypass grafting. She denies any shortness of breath or chest pain. She does complain of some leg weakness particularly with walking.  Check pacer generator changed in October. She has developed some keloid formation over the pacer incision  A recent echocardiogram reveals mild to moderate left ventricular dysfunction with an ejection fraction of 40%.  There is moderate mitral regurgitation and moderate tricuspid regurgitation. Right ventricle appears to be dilated. Her pulmonary pressures are at the upper limits of normal at 36 mmHg. Her Lasix dose was increased by Sydney Moore and she's feeling a bit better.  Septeber 17, 2013 - she has done well.  She has occasional episodes of CP when walking - goes away very quickly with a SL NTG.  These episodes of chest pain are very rare. She's able to do most of her normal activities without any significant problems. She has known stenosis in her ramus intermediate branch that was not amenable to PCI. We were not able to put a wire down that vessel. At that point she was started on Ranexa and has done well since that time.  June 27 ,2014: Sydney Moore is doing well.  She has occasional CP and takes NTG occasionally.  Feb. 9, 2015: Sydney Moore is doing ok.  She still has some exertional angina relieved with SL NTG.   She is able to do all of her normal daily activities  without significant limitations as long as she takes the nitroglycerin.   She is on Ranexa 1000 BID.    Her BP is high this am - she has not taken her BP pills yet  Current Outpatient Prescriptions on File Prior to Visit  Medication Sig Dispense Refill  . amLODipine (NORVASC) 5 MG tablet Take 1 tablet (5 mg total) by mouth daily.  30 tablet  5  . aspirin 81 MG tablet Take 81 mg by mouth every morning.        Marland Kitchen. atorvastatin (LIPITOR) 20 MG tablet Take 1 tablet (20 mg total) by mouth daily.  30 tablet  11  . calcium-vitamin D (OSCAL WITH D) 500-200 MG-UNIT per tablet Take 1 tablet by mouth daily.        . clobetasol cream (TEMOVATE) 0.05 % Apply 1 application topically as needed. Apply to affected on skin twice a day as needed for rash  30 g  0  . clopidogrel (PLAVIX) 75 MG tablet Take 75 mg by mouth daily.       Marland Kitchen. esomeprazole (NEXIUM) 40 MG capsule Take 40 mg by mouth daily before breakfast.      . furosemide (LASIX) 40 MG tablet take 1 tablet by mouth once daily  30 tablet  5  . isosorbide mononitrate (IMDUR) 60 MG 24 hr tablet Take 1.5 tablets (90 mg total) by mouth daily.  45 tablet  6  . levothyroxine (  SYNTHROID, LEVOTHROID) 50 MCG tablet Take 50 mcg by mouth daily.        Marland Kitchen losartan (COZAAR) 50 MG tablet Take 1 tablet (50 mg total) by mouth daily.  30 tablet  6  . meclizine (ANTIVERT) 12.5 MG tablet Take 1 tablet (12.5 mg total) by mouth 3 (three) times daily as needed for dizziness.  30 tablet  0  . metoprolol succinate (TOPROL-XL) 50 MG 24 hr tablet Take 50 mg by mouth daily. Take with or immediately following a meal.      . metroNIDAZOLE (METROGEL) 1 % gel Apply 1 application topically daily as needed (for vaginal).      Marland Kitchen NITROSTAT 0.4 MG SL tablet PLACE 1 TABLET UNDER THE TONGUE EVERY 5 MINUTES AS NEEDED FOR CHEST PAIN  90 each  3  . potassium chloride SA (K-DUR,KLOR-CON) 20 MEQ tablet Take 20 mEq by mouth every evening.      . ranolazine (RANEXA) 1000 MG SR tablet Take 1 tablet (1,000  mg total) by mouth 2 (two) times daily.  60 tablet  6   No current facility-administered medications on file prior to visit.    No Known Allergies  Past Medical History  Diagnosis Date  . Hypertension   . Hyperlipidemia   . Diabetes mellitus   . Chronic cough     Seen by Dr. Sherene Sires  . Hypothyroid   . CAD (coronary artery disease) 1989    a. 1989: s/p CABGx3;  b. 06/2007 Cath/Attempted PCI: LM min irregs, LAD 90/100, D1 90 (attempted PCI), RI 90, LCX100p, OM1 100, RCA 60/70p, 90/73m, VG->RCA nl, VG->OM 100, LIMA->LAD nl, EF 50%.  . Complete heart block     a. s/p PPM (MDT) by Dr Deborah Chalk 2004; b. Gen change 09/2010 (MDT ADDRL1 Adapta DC PPM Ser #:[id number]  . Carotid artery occlusion 2011    60-79%right/ 40-59% left  . GERD (gastroesophageal reflux disease)   . Chronic systolic CHF (congestive heart failure)     a. 03/2011 Echo: EF 40% inflat HK, Gr1 DD, mod MR/TR  . Chest pain March 2014    Past Surgical History  Procedure Laterality Date  . Coronary artery bypass graft  1989  . Pacemaker placement  2004    MDT by Dr Deborah Chalk  . Cataract extraction  2004    Both  . Cardiac catheterization  2004, 2007, 2009  . Eye surgery      History  Smoking status  . Former Smoker  . Quit date: 12/15/1973  Smokeless tobacco  . Never Used    History  Alcohol Use No    Family History  Problem Relation Age of Onset  . Hypertension Mother   . Liver cancer Brother   . Breast cancer Daughter   . Colon cancer Brother   . Cancer Brother     colon    Reviw of Systems:  Reviewed in the HPI.  All other systems are negative.  Physical Exam: BP 150/70  Pulse 58  Ht 5\' 3"  (1.6 m)  Wt 129 lb (58.514 kg)  BMI 22.86 kg/m2  SpO2 93% The patient is alert and oriented x 3.  The mood and affect are normal.   Skin: warm and dry.  Color is normal.    HEENT:   the sclera are nonicteric.  The mucous membranes are moist.  The carotids are 2+ with soft bruits. .  There is no thyromegaly.   Pulsatile next is inferior to her carotid arteries that are either do  to a large V wave or perhaps do to her common carotid artery.  Lungs: clear.  The chest wall is non tender.    Heart: regular rate with a normal S1 and S2.  There is a soft systolic murmur The PMI is not displaced.  Her pacer is in the right subclavian and has a small amount of keloid.   Abdomen: good bowel sounds.  There is no guarding or rebound.  There is no hepatosplenomegaly or tenderness.  There are no masses.   Extremities:  no clubbing, cyanosis, or edema.  The legs are without rashes.  The distal pulses are diminished  Neuro:  Cranial nerves II - XII are intact.  Motor and sensory functions are intact.    The gait is normal.  ECG:     Assessment / Plan:

## 2013-03-21 NOTE — Assessment & Plan Note (Signed)
Stable

## 2013-03-24 ENCOUNTER — Other Ambulatory Visit: Payer: Self-pay | Admitting: Surgery

## 2013-03-24 DIAGNOSIS — I6529 Occlusion and stenosis of unspecified carotid artery: Secondary | ICD-10-CM

## 2013-03-24 DIAGNOSIS — Z48812 Encounter for surgical aftercare following surgery on the circulatory system: Secondary | ICD-10-CM

## 2013-03-28 ENCOUNTER — Encounter (HOSPITAL_COMMUNITY): Payer: Self-pay | Admitting: Emergency Medicine

## 2013-03-28 ENCOUNTER — Emergency Department (HOSPITAL_COMMUNITY): Payer: Medicare Other

## 2013-03-28 ENCOUNTER — Observation Stay (HOSPITAL_COMMUNITY)
Admission: EM | Admit: 2013-03-28 | Discharge: 2013-03-29 | Disposition: A | Discharge status: Home / Self Care | Payer: Medicare Other | Attending: Cardiology | Admitting: Cardiology

## 2013-03-28 DIAGNOSIS — I442 Atrioventricular block, complete: Secondary | ICD-10-CM | POA: Insufficient documentation

## 2013-03-28 DIAGNOSIS — I5042 Chronic combined systolic (congestive) and diastolic (congestive) heart failure: Secondary | ICD-10-CM | POA: Insufficient documentation

## 2013-03-28 DIAGNOSIS — Z950 Presence of cardiac pacemaker: Secondary | ICD-10-CM | POA: Insufficient documentation

## 2013-03-28 DIAGNOSIS — E039 Hypothyroidism, unspecified: Secondary | ICD-10-CM | POA: Insufficient documentation

## 2013-03-28 DIAGNOSIS — E119 Type 2 diabetes mellitus without complications: Secondary | ICD-10-CM | POA: Insufficient documentation

## 2013-03-28 DIAGNOSIS — Z7982 Long term (current) use of aspirin: Secondary | ICD-10-CM | POA: Insufficient documentation

## 2013-03-28 DIAGNOSIS — I10 Essential (primary) hypertension: Secondary | ICD-10-CM | POA: Insufficient documentation

## 2013-03-28 DIAGNOSIS — Z951 Presence of aortocoronary bypass graft: Secondary | ICD-10-CM | POA: Insufficient documentation

## 2013-03-28 DIAGNOSIS — I2510 Atherosclerotic heart disease of native coronary artery without angina pectoris: Secondary | ICD-10-CM | POA: Insufficient documentation

## 2013-03-28 DIAGNOSIS — R0789 Other chest pain: Principal | ICD-10-CM | POA: Insufficient documentation

## 2013-03-28 DIAGNOSIS — E785 Hyperlipidemia, unspecified: Secondary | ICD-10-CM | POA: Insufficient documentation

## 2013-03-28 DIAGNOSIS — Z87891 Personal history of nicotine dependence: Secondary | ICD-10-CM | POA: Insufficient documentation

## 2013-03-28 DIAGNOSIS — K219 Gastro-esophageal reflux disease without esophagitis: Secondary | ICD-10-CM | POA: Insufficient documentation

## 2013-03-28 DIAGNOSIS — I200 Unstable angina: Secondary | ICD-10-CM | POA: Insufficient documentation

## 2013-03-28 DIAGNOSIS — R079 Chest pain, unspecified: Secondary | ICD-10-CM

## 2013-03-28 DIAGNOSIS — I119 Hypertensive heart disease without heart failure: Secondary | ICD-10-CM | POA: Diagnosis present

## 2013-03-28 DIAGNOSIS — I509 Heart failure, unspecified: Secondary | ICD-10-CM

## 2013-03-28 DIAGNOSIS — Z9889 Other specified postprocedural states: Secondary | ICD-10-CM | POA: Insufficient documentation

## 2013-03-28 DIAGNOSIS — Z79899 Other long term (current) drug therapy: Secondary | ICD-10-CM | POA: Insufficient documentation

## 2013-03-28 DIAGNOSIS — R0602 Shortness of breath: Secondary | ICD-10-CM | POA: Insufficient documentation

## 2013-03-28 LAB — CBC WITH DIFFERENTIAL/PLATELET
Basophils Absolute: 0 10*3/uL (ref 0.0–0.1)
Basophils Relative: 0 % (ref 0–1)
EOS ABS: 0.1 10*3/uL (ref 0.0–0.7)
Eosinophils Relative: 2 % (ref 0–5)
HCT: 32.7 % — ABNORMAL LOW (ref 36.0–46.0)
Hemoglobin: 11.6 g/dL — ABNORMAL LOW (ref 12.0–15.0)
LYMPHS ABS: 1.7 10*3/uL (ref 0.7–4.0)
Lymphocytes Relative: 29 % (ref 12–46)
MCH: 33.5 pg (ref 26.0–34.0)
MCHC: 35.5 g/dL (ref 30.0–36.0)
MCV: 94.5 fL (ref 78.0–100.0)
Monocytes Absolute: 0.7 10*3/uL (ref 0.1–1.0)
Monocytes Relative: 11 % (ref 3–12)
Neutro Abs: 3.4 10*3/uL (ref 1.7–7.7)
Neutrophils Relative %: 58 % (ref 43–77)
Platelets: 224 10*3/uL (ref 150–400)
RBC: 3.46 MIL/uL — AB (ref 3.87–5.11)
RDW: 13.7 % (ref 11.5–15.5)
WBC: 5.9 10*3/uL (ref 4.0–10.5)

## 2013-03-28 LAB — BASIC METABOLIC PANEL
BUN: 15 mg/dL (ref 6–23)
CO2: 29 meq/L (ref 19–32)
Calcium: 9.9 mg/dL (ref 8.4–10.5)
Chloride: 96 mEq/L (ref 96–112)
Creatinine, Ser: 1.14 mg/dL — ABNORMAL HIGH (ref 0.50–1.10)
GFR calc Af Amer: 49 mL/min — ABNORMAL LOW (ref >90)
GFR calc non Af Amer: 42 mL/min — ABNORMAL LOW (ref >90)
GLUCOSE: 96 mg/dL (ref 70–99)
POTASSIUM: 4.3 meq/L (ref 3.7–5.3)
SODIUM: 138 meq/L (ref 137–147)

## 2013-03-28 LAB — CREATININE, SERUM
Creatinine, Ser: 1.11 mg/dL — ABNORMAL HIGH (ref 0.50–1.10)
GFR calc Af Amer: 50 mL/min — ABNORMAL LOW (ref >90)
GFR, EST NON AFRICAN AMERICAN: 43 mL/min — AB (ref >90)

## 2013-03-28 LAB — CBC
HCT: 30.7 % — ABNORMAL LOW (ref 36.0–46.0)
Hemoglobin: 10.9 g/dL — ABNORMAL LOW (ref 12.0–15.0)
MCH: 33.3 pg (ref 26.0–34.0)
MCHC: 35.5 g/dL (ref 30.0–36.0)
MCV: 93.9 fL (ref 78.0–100.0)
Platelets: 210 10*3/uL (ref 150–400)
RBC: 3.27 MIL/uL — ABNORMAL LOW (ref 3.87–5.11)
RDW: 13.6 % (ref 11.5–15.5)
WBC: 4.7 10*3/uL (ref 4.0–10.5)

## 2013-03-28 LAB — TROPONIN I: Troponin I: <0.3 ng/mL (ref <0.30)

## 2013-03-28 LAB — POCT I-STAT TROPONIN I: Troponin i, poc: 0.01 ng/mL (ref 0.00–0.08)

## 2013-03-28 LAB — PRO B NATRIURETIC PEPTIDE: PRO B NATRI PEPTIDE: 1015 pg/mL — AB (ref 0–450)

## 2013-03-28 LAB — MAGNESIUM: Magnesium: 2.1 mg/dL (ref 1.5–2.5)

## 2013-03-28 MED ORDER — ATORVASTATIN CALCIUM 20 MG PO TABS
20.0000 mg | ORAL_TABLET | Freq: Every day | ORAL | Status: DC
  Administered 2013-03-28 – 2013-03-29 (×2): 20 mg via ORAL
  Filled 2013-03-28 (×2): qty 1

## 2013-03-28 MED ORDER — NITROGLYCERIN 0.4 MG SL SUBL: 0.4000 mg | SUBLINGUAL_TABLET | SUBLINGUAL | Status: DC | PRN

## 2013-03-28 MED ORDER — PANTOPRAZOLE SODIUM 40 MG PO TBEC
80.0000 mg | DELAYED_RELEASE_TABLET | Freq: Every day | ORAL | Status: DC
  Administered 2013-03-29: 80 mg via ORAL

## 2013-03-28 MED ORDER — FUROSEMIDE 40 MG PO TABS
40.0000 mg | ORAL_TABLET | Freq: Every day | ORAL | Status: DC
  Administered 2013-03-29: 40 mg via ORAL
  Filled 2013-03-28: qty 1

## 2013-03-28 MED ORDER — SODIUM CHLORIDE 0.9 % IJ SOLN
3.0000 mL | Freq: Two times a day (BID) | INTRAMUSCULAR | Status: DC
  Administered 2013-03-28 – 2013-03-29 (×2): 3 mL via INTRAVENOUS

## 2013-03-28 MED ORDER — SODIUM CHLORIDE 0.9 % IV SOLN: 250.0000 mL | INTRAVENOUS | Status: DC | PRN

## 2013-03-28 MED ORDER — ISOSORBIDE MONONITRATE ER 60 MG PO TB24
120.0000 mg | ORAL_TABLET | Freq: Every day | ORAL | Status: DC
  Administered 2013-03-29: 120 mg via ORAL
  Filled 2013-03-28: qty 2

## 2013-03-28 MED ORDER — SODIUM CHLORIDE 0.9 % IJ SOLN: 3.0000 mL | INTRAMUSCULAR | Status: DC | PRN

## 2013-03-28 MED ORDER — POTASSIUM CHLORIDE CRYS ER 20 MEQ PO TBCR
20.0000 meq | EXTENDED_RELEASE_TABLET | Freq: Every evening | ORAL | Status: DC
  Administered 2013-03-28: 20 meq via ORAL
  Filled 2013-03-28 (×2): qty 1

## 2013-03-28 MED ORDER — ENOXAPARIN SODIUM 40 MG/0.4ML ~~LOC~~ SOLN
40.0000 mg | SUBCUTANEOUS | Status: DC
  Administered 2013-03-28: 40 mg via SUBCUTANEOUS
  Filled 2013-03-28 (×2): qty 0.4

## 2013-03-28 MED ORDER — CLOPIDOGREL BISULFATE 75 MG PO TABS
75.0000 mg | ORAL_TABLET | Freq: Every day | ORAL | Status: DC
  Administered 2013-03-29: 75 mg via ORAL
  Filled 2013-03-28: qty 1

## 2013-03-28 MED ORDER — LEVOTHYROXINE SODIUM 50 MCG PO TABS
50.0000 ug | ORAL_TABLET | Freq: Every day | ORAL | Status: DC
  Administered 2013-03-29: 50 ug via ORAL
  Filled 2013-03-28 (×2): qty 1

## 2013-03-28 MED ORDER — RANOLAZINE ER 500 MG PO TB12: 1000.0000 mg | ORAL_TABLET | Freq: Two times a day (BID) | ORAL | Status: DC

## 2013-03-28 MED ORDER — RANOLAZINE ER 500 MG PO TB12
1000.0000 mg | ORAL_TABLET | Freq: Two times a day (BID) | ORAL | Status: DC
  Administered 2013-03-28 – 2013-03-29 (×2): 1000 mg via ORAL
  Filled 2013-03-28 (×3): qty 2

## 2013-03-28 MED ORDER — NITROGLYCERIN 0.4 MG SL SUBL
0.4000 mg | SUBLINGUAL_TABLET | Freq: Once | SUBLINGUAL | Status: AC
  Administered 2013-03-28: 0.4 mg via SUBLINGUAL
  Filled 2013-03-28: qty 25

## 2013-03-28 MED ORDER — NITROGLYCERIN 2 % TD OINT
1.0000 [in_us] | TOPICAL_OINTMENT | Freq: Once | TRANSDERMAL | Status: AC
  Administered 2013-03-28: 1 [in_us] via TOPICAL
  Filled 2013-03-28 (×2): qty 1

## 2013-03-28 MED ORDER — ASPIRIN 300 MG RE SUPP
300.0000 mg | RECTAL | Status: AC
  Filled 2013-03-28: qty 1

## 2013-03-28 MED ORDER — ASPIRIN EC 81 MG PO TBEC: 81.0000 mg | DELAYED_RELEASE_TABLET | Freq: Every day | ORAL | Status: DC

## 2013-03-28 MED ORDER — NITROGLYCERIN 0.4 MG/SPRAY TL SOLN: 1.0000 | Status: DC | PRN

## 2013-03-28 MED ORDER — LOSARTAN POTASSIUM 50 MG PO TABS
50.0000 mg | ORAL_TABLET | Freq: Every day | ORAL | Status: DC
  Administered 2013-03-29: 50 mg via ORAL
  Filled 2013-03-28: qty 1

## 2013-03-28 MED ORDER — AMLODIPINE BESYLATE 5 MG PO TABS
5.0000 mg | ORAL_TABLET | Freq: Every day | ORAL | Status: DC
Start: 2013-03-29 — End: 2013-03-29
  Administered 2013-03-29: 5 mg via ORAL
  Filled 2013-03-28: qty 1

## 2013-03-28 MED ORDER — ASPIRIN EC 81 MG PO TBEC
81.0000 mg | DELAYED_RELEASE_TABLET | Freq: Every day | ORAL | Status: DC
  Administered 2013-03-29: 81 mg via ORAL
  Filled 2013-03-28: qty 1

## 2013-03-28 MED ORDER — MECLIZINE HCL 12.5 MG PO TABS
12.5000 mg | ORAL_TABLET | Freq: Three times a day (TID) | ORAL | Status: DC | PRN
  Filled 2013-03-28: qty 1

## 2013-03-28 MED ORDER — METOPROLOL SUCCINATE ER 50 MG PO TB24
50.0000 mg | ORAL_TABLET | Freq: Two times a day (BID) | ORAL | Status: DC
  Administered 2013-03-28 – 2013-03-29 (×2): 50 mg via ORAL
  Filled 2013-03-28 (×3): qty 1

## 2013-03-28 MED ORDER — ASPIRIN 81 MG PO CHEW
324.0000 mg | CHEWABLE_TABLET | ORAL | Status: AC
  Administered 2013-03-28: 324 mg via ORAL
  Filled 2013-03-28: qty 4

## 2013-03-28 MED ORDER — ASPIRIN 81 MG PO TABS: 81.0000 mg | ORAL_TABLET | ORAL | Status: DC

## 2013-03-28 NOTE — ED Notes (Signed)
Pt states chest tightness is better, but not gone. MD aware. Placed nitro patch. BP 161/59

## 2013-03-28 NOTE — H&P (Addendum)
Patient ID: Sydney Moore MRN: [id number], DOB/AGE: 78/22/1925   Admit date: 03/28/2013   Primary Physician: Rene Paci, MD Primary Cardiologist: Dr. Elease Hashimoto   Pt. Profile: Sydney Moore is a 80 y.o. female with a history of HTN, HLD, hypothyroidism, GERD, chronic systolic congestive heart failure (EF 40%- ECHO 2013), chronic chest pain on Ranexa, CAD s/p 1989 CABGx3; 2009 cath/attempted PCI, carotid artery disease being followed and PPM for complete heart block (2004) who presented to [facility] ED complaining of chest pain. She was recently seen by Dr. Elease Hashimoto on Feb 9th for a regular office follow up. She had reported stable exertional angina relieved with SL NTG at that time. She has known disease in the ramus branch and was able to do all of her normal daily activities without significant limitations with NTG. Patient has had a cough for a couple of days and not sleeping well. She started having off and on chest pain since yesterday which has become worse today. Today she had tightness and pressure in her chest that was not resolved by NTG, which prompted her to come to the ED. She has no SOB, diaphoresis, or n/v. She did notice that she felt gassy yesterday which is reminiscent of her previous cardiac pain before her CABG in 1989. No palpitations, orthopnea or lower extremity edema.    Problem List  Past Medical History  Diagnosis Date  . Hypertension   . Hyperlipidemia   . Diabetes mellitus   . Chronic cough     Seen by Dr. Sherene Sires  . Hypothyroid   . CAD (coronary artery disease) 1989    a. 1989: s/p CABGx3;  b. 06/2007 Cath/Attempted PCI: LM min irregs, LAD 90/100, D1 90 (attempted PCI), RI 90, LCX100p, OM1 100, RCA 60/70p, 90/11m, VG->RCA nl, VG->OM 100, LIMA->LAD nl, EF 50%.  . Complete heart block     a. s/p PPM (MDT) by Dr Deborah Chalk 2004; b. Gen change 09/2010 (MDT ADDRL1 Adapta DC PPM Ser #:[id number]  . Carotid artery occlusion 2011    60-79%right/ 40-59% left  . GERD  (gastroesophageal reflux disease)   . Chronic systolic CHF (congestive heart failure)     a. 03/2011 Echo: EF 40% inflat HK, Gr1 DD, mod MR/TR  . Chest pain March 2014    Past Surgical History  Procedure Laterality Date  . Coronary artery bypass graft  1989  . Pacemaker placement  2004    MDT by Dr Deborah Chalk  . Cataract extraction  2004    Both  . Cardiac catheterization  2004, 2007, 2009  . Eye surgery       Allergies  No Known Allergies  Home Medications  Prior to Admission medications   Medication Sig Start Date End Date Taking? Authorizing Provider  amLODipine (NORVASC) 5 MG tablet Take 1 tablet (5 mg total) by mouth daily. 03/21/13  Yes Vesta Mixer, MD  aspirin 81 MG tablet Take 81 mg by mouth every morning.     Yes Historical Provider, MD  atorvastatin (LIPITOR) 20 MG tablet Take 1 tablet (20 mg total) by mouth daily. 03/21/13  Yes Vesta Mixer, MD  calcium-vitamin D (OSCAL WITH D) 500-200 MG-UNIT per tablet Take 1 tablet by mouth daily.     Yes Historical Provider, MD  clobetasol cream (TEMOVATE) 0.05 % Apply 1 application topically as needed. Apply to affected on skin twice a day as needed for rash 10/08/12  Yes Newt Lukes, MD  clopidogrel (PLAVIX) 75 MG tablet  Take 1 tablet (75 mg total) by mouth daily. 03/21/13  Yes Vesta Mixer, MD  esomeprazole (NEXIUM) 40 MG capsule Take 40 mg by mouth daily before breakfast.   Yes Historical Provider, MD  furosemide (LASIX) 40 MG tablet take 1 tablet by mouth once daily 03/21/13  Yes Vesta Mixer, MD  isosorbide mononitrate (IMDUR) 60 MG 24 hr tablet Take 1.5 tablets (90 mg total) by mouth daily. 03/21/13  Yes Vesta Mixer, MD  levothyroxine (SYNTHROID, LEVOTHROID) 50 MCG tablet Take 50 mcg by mouth daily.     Yes Historical Provider, MD  losartan (COZAAR) 50 MG tablet Take 1 tablet (50 mg total) by mouth daily. 03/21/13  Yes Vesta Mixer, MD  meclizine (ANTIVERT) 12.5 MG tablet Take 1 tablet (12.5 mg total) by mouth 3  (three) times daily as needed for dizziness. 11/05/12  Yes Newt Lukes, MD  metoprolol succinate (TOPROL-XL) 50 MG 24 hr tablet Take 50 mg by mouth daily. Take with or immediately following a meal. 03/21/13  Yes Vesta Mixer, MD  metroNIDAZOLE (METROGEL) 1 % gel Apply 1 application topically daily as needed (for vaginal).   Yes Historical Provider, MD  nitroGLYCERIN (NITROLINGUAL) 0.4 MG/SPRAY spray Place 1 spray under the tongue every 5 (five) minutes x 3 doses as needed for chest pain.   Yes Historical Provider, MD  potassium chloride SA (K-DUR,KLOR-CON) 20 MEQ tablet Take 1 tablet (20 mEq total) by mouth every evening. 03/21/13  Yes Vesta Mixer, MD  ranolazine (RANEXA) 1000 MG SR tablet Take 1 tablet (1,000 mg total) by mouth 2 (two) times daily. 03/21/13  Yes Vesta Mixer, MD  nitroGLYCERIN (NITROSTAT) 0.4 MG SL tablet PLACE 1 TABLET UNDER THE TONGUE EVERY 5 MINUTES AS NEEDED FOR CHEST PAIN 03/21/13   Vesta Mixer, MD    Family History  Family History  Problem Relation Age of Onset  . Hypertension Mother   . Liver cancer Brother   . Breast cancer Daughter   . Colon cancer Brother   . Cancer Brother     colon    Social History  History   Social History  . Marital Status: Widowed    Spouse Name: N/A    Number of Children: 2  . Years of Education: N/A   Occupational History  . 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 Smoking status: Former Smoker    Quit date: 12/15/1973  . Smokeless tobacco: Never Used  . Alcohol Use: No  . Drug Use: No  . Sexual Activity: No   Other Topics Concern  . Not on file   Social History Narrative   Health Care POA: son, Sydney Moore   Emergency Contact: son, Sydney Moore [phone]   End of Life Plan:    Who lives with you: Self   Any pets: none   Diet: Patient has a varied diet of protein, starch, vegetables, and fruit   Exercise: Patient does group exercises 2x week and walks 2x week.    Seatbelts: Patient reports wearing seatbelt when in vehicle.    Sun Exposure/Protection: Patient reports not using sun protection.   Hobbies: Church, watching tv, walking         All other systems reviewed and are otherwise negative except as noted above.  Physical Exam  Blood pressure 153/53, pulse 61, temperature 98.1 F (36.7 C), temperature source Oral, resp. rate 19, height 5\' 3"  (1.6 m), weight 129 lb (58.514 kg),  SpO2 99.00%.  General: Pleasant, NAD Psych: Normal affect. Neuro: Alert and oriented X 3. Moves all extremities spontaneously. HEENT: Normal  Neck: Supple + bruit or JVD. Lungs:  Resp regular and unlabored, CTA. Heart: RRR no s3, s4. 1/6 early SEM RUSB  Abdomen: Soft, non-tender, non-distended, BS + x 4.  Extremities: No clubbing, cyanosis or edema. DP/PT/Radials 2+ and equal bilaterally.  Labs   Lab Results  Component Value Date   WBC 5.9 03/28/2013   HGB 11.6* 03/28/2013   HCT 32.7* 03/28/2013   MCV 94.5 03/28/2013   PLT 224 03/28/2013    Recent Labs Lab 03/28/13 1439  NA 138  K 4.3  CL 96  CO2 29  BUN 15  CREATININE 1.14*  CALCIUM 9.9  GLUCOSE 96   Lab Results  Component Value Date   CHOL 142 03/21/2013   HDL 53.10 03/21/2013   LDLCALC 72 03/21/2013   TRIG 84.0 03/21/2013     Radiology/Studies 2D ECHO Study Date: 03/25/2011 ------------------------------------------------------ LV EF: 40% ----------------------------------------------------------- Indications: Dyspnea 786.09. ------------------------------------------------------------ History: PMH: AV block. Acquired from the patient and from the patient's chart. Dyspnea. Coronary artery disease. Congestive heart failure. PMH: Myocardial infarction. Risk factors: Former tobacco use. Hypertension. Dyslipidemia. ------------------------------------------------------------ Study Conclusions - Left ventricle: There is septal dyssynergy from pacing. There is inferolateral hypokinesis. The cavity  size was normal. Wall thickness was increased in a pattern of moderate LVH. The estimated ejection fraction was 40%. Doppler parameters are consistent with abnormal left ventricular relaxation (grade 1 diastolic dysfunction). - Aortic valve: Sclerosis without stenosis. Trivial regurgitation. - Mitral valve: Moderate regurgitation. - Right ventricle: The RV is not seen well. I suspect some RV dilitation and RV dysfunction. I don't see the pacer well. - Right atrium: The atrium was mildly dilated. - Tricuspid valve: Moderate regurgitation. - Pulmonary arteries: PA peak pressure: 70mm Hg (S). ------------------------------------------------------------ Labs, prior tests, procedures, and surgery: Permanent pacemaker system implantation. Coronary artery bypass grafting (1989). Transthoracic echocardiography. M-mode, complete 2D, spectral Doppler, and color Doppler. Height: Height: 160cm. Height: 63in. Weight: Weight: 61.2kg. Weight: 134.7lb. Body mass index: BMI: 23.9kg/m^2. Body surface area: BSA: 1.65m^2. Blood pressure: 134/72. Patient status: Outpatient. Location: [facility] ------------------------------------------------------------ ------------------------------------------------------------ Left ventricle: There is septal dyssynergy from pacing. There is inferolateral hypokinesis. The cavity size was normal. Wall thickness was increased in a pattern of moderate LVH. The estimated ejection fraction was 40%. Doppler parameters are consistent with abnormal left ventricular relaxation (grade 1 diastolic dysfunction). ------------------------------------------------------------ Aortic valve: Sclerosis without stenosis. Doppler: Trivial regurgitation. ------------------------------------------------------------ Aorta: Aortic root: The aortic root was normal in size. ------------------------------------------------------------ Mitral valve: The valve appears to be grossly  normal. Doppler: Moderate regurgitation. ------------------------------------------------------------ Left atrium: The atrium was at the upper limits of normal in size. ----------------------------------------------------------- Right ventricle: The RV is not seen well. I suspect some RV dilitation and RV dysfunction. I don't see the pacer well. ------------------------------------------------------------ Pulmonic valve: The valve appears to be grossly normal. Doppler: No significant regurgitation. ------------------------------------------------------------ Tricuspid valve: The valve appears to be grossly normal. Doppler: Moderate regurgitation. ------------------------------------------------------------ Right atrium: The atrium was mildly dilated. ------------------------------------------------------------ Pericardium: There was no pericardial effusion.   07/02/2007  CARDIAC CATHETERIZATION  Sydney Moore is an 80 year old female with a history of coronary artery  disease. She had CABG 19 years ago. She was referred for heart  catheterization after having episodes of angina.  The right femoral artery was easily cannulated using a modified  Seldinger technique.  HEMODYNAMIC RESULTS: LV pressure is 120/10 with an aortic pressure  of  118/54.  Angiography left main. The left main has minor luminal irregularities.  Left anterior descending artery has a 90% proximal stenosis and is then  occluded after giving off a small first diagonal vessel. The first  diagonal artery has a 90% stenosis at its origin.  There is a ramus intermediate branch appears to have a very complex  origin. There is a 90% stenosis in the origin of this vessel. There  also appears to be a small branch coming to the ramus intermediate  vessel that is severely and diffusely diseased.  The left circumflex artery has severe diffuse disease. It is occluded  after giving off a first obtuse marginal artery. The  obtuse marginal  artery is also occluded and fills very slowly. There is dye hang up in  the first OM.  The right coronary artery has severe diffuse disease between 60% and  70% throughout its course. There is a mid 90% stenosis followed by a  95% stenosis. Competitive flow can be seen filling from the graft.  The saphenous vein graft to the right coronary artery is normal. There  is brisk flow. The saphenous vein graft to the obtuse marginal artery  is occluded. The left internal mammary artery to LAD reveals a widely  patent left internal mammary artery. The LAD has minor luminal  irregularities.  The left ventriculogram was performed in a 30 RAO position. Reveals  overall well-preserved left ventricular systolic function. Ejection  fraction approximately 50%. There appears to be a mid left ventricular  obstruction (dynamic obstruction).  COMPLICATIONS: None.  CONCLUSION: Severe native coronary artery disease. She has a ramus  intermediate vessel with a 90% stenosis that may be the cause of some of  her angina. We will consider percutaneous transluminal coronary  angioplasty and stenting of this lesion. We also may need to consider  adding some beta-blockers and she appears to have left ventricular  outflow tract obstruction.   Dg Chest Portable 1 View  03/28/2013   CLINICAL DATA:  Chest pain.  EXAM: PORTABLE CHEST - 1 VIEW  COMPARISON:  04/28/2012  FINDINGS: Sternotomy wires a right-sided pacemaker are unchanged. Lungs are hypoinflated but otherwise clear. There is borderline cardiomegaly unchanged. There is calcified plaque over the thoracic aorta and mild degenerative change of the spine.  IMPRESSION: Hypoinflation without acute cardiopulmonary disease.     ECG HR 64, SR LVH with IVCD, LAD and secondary repol abnrm  ASSESSMENT AND PLAN  Chest pain- patient with known CAD, s/p CABG and PCI with anatomy not amendable to PCI  - Chronic chest pain historically well controlled on  Ranexa and NTG, now unstable - Troponin neg x1, ECG with no acute ST or TW changes - Admit for observation, cycle cardiac enzymes and serial EKG - Will increase her metoprolol to 50 BID and add 120mg  Imdur - Continue ASA/Plavix - If rules out will do Lexiscan myovue in morning, NPO after midnight.  Carotid artery disease- continue asa/plavix  Atrioventricular block, complete  - s/p PPM, Normal pacemaker function per Dr. Johney Frame visit in 11/2012  Chronic systolic congestive heart failure-  03/2011 Echo: EF 40% inferolat HK, Gr1 DD, mod MR/TR - euvolemic on exam  HLD- well controlled, continue statin  HTN- stable, continue amlodipine and losartan    Urban Gibson, PA-C 03/28/2013, 4:13 PM  Pager [phone]  Patient seen with PA, agree with the above note.  Patient has history of CAD s/p CABG as well as chronic stable angina.  History  is a bit difficult.  It sounds like she has chronic exertional chest pain.  Then Saturday, she had left lateral chest pain at rest that resolved after about an hour.  Sunday she was ok.  Today, around noon she developed left lateral chest pain and substernal chest heaviness.  This has been persistent since that time.  Now it is relatively mild (has NTG patch in place).  ECG v-paced.  TnI negative x 1.   1. CAD: s/p CABG, has history of chronic stable angina.  She has a known ramus lesion that is not amenable to PCI.  She presents with persistent chest pain, now low level, that is not relieved by NTG.  CEs negative x 1 and ECG v-paced.  I am uncertain if this is ischemic pain, her history is a bit difficult.  Will admit, continue to cycle cardiac enzymes.  Continue amlodipine and Ranexa, increase Toprol XL to 50 bid, increase Imdur to 120 daily. If she rules out for MI, would plan Lexiscan Cardiolite in the morning. Heparin gtt if cardiac enzymes trend positive.  2. Chronic systolic CHF: 2/13 echo with EF 40%.  She does not look volume overloaded on exam.    3. AV block s/p PCM.  She is v-pacing.   Marca AnconaDalton McLean 03/28/2013 5:26 PM

## 2013-03-28 NOTE — ED Notes (Signed)
Pt having off and on chest pain since yesterday- worse today. Hx of CABG and PPM. 134/63 BP. Pt took 324 mg ASA at home, EMS gave 1 nitro. Pt is now pain free. Pt denies N/V, diaphoresis, SOB.

## 2013-03-28 NOTE — ED Provider Notes (Signed)
CSN: [id number]     Arrival date & time 03/28/13  1341 History   First MD Initiated Contact with Patient 03/28/13 1504     Chief Complaint  Patient presents with  . Chest Pain    HPI Comments: 80 yo F hx of HTN, HLD, DMII, CAD s/p CABG X 3 (1989), Last Cath 2009, Complete heart block s/p PPM, carotid artery occlusion, chronic systolic CHF (Last EF 40% 2013), presents with CC of chest pain.  Pt states symptoms started Saturday.  She has had intermittent central chest tightness which has resolved on own.  Today while walking into her kitchen, she had central chest tightness, nonradiating, with associated lightheadedness, SOB.  Pt took 324 mg ASA, 1 Nitro spray, and received 1 NTG SL tablet by EMS, with improvement in pain from 10/10 to 5/10.  Pt denies fever, chills, cough, nausea, vomiting, abdominal pain, diarrhea, constipation, myalgias, rash, or any other symptoms.  Currently pt still has 5/10 central chest tightness, without any other symptoms.  She mentions some elevated BPs at home this week, into the 150's systolic, which is not controlled for her.  Pt's last cath 2009, with LM min irregs, LAD 90/100, D1 90 (attempted PCI), RI 90, LCX100p, OM1 100, RCA 60/70p, 90/58m, VG->RCA nl, VG->OM 100, LIMA->LAD nl, EF 50%.  Pt is stable on home medications, including ASA, Plavix, Ranexa.    The history is provided by the patient. No language interpreter was used.    Past Medical History  Diagnosis Date  . Hypertension   . Hyperlipidemia   . Diabetes mellitus   . Chronic cough     Seen by Dr. Sherene Sires  . Hypothyroid   . CAD (coronary artery disease) 1989    a. 1989: s/p CABGx3;  b. 06/2007 Cath/Attempted PCI: LM min irregs, LAD 90/100, D1 90 (attempted PCI), RI 90, LCX100p, OM1 100, RCA 60/70p, 90/81m, VG->RCA nl, VG->OM 100, LIMA->LAD nl, EF 50%.  . Complete heart block     a. s/p PPM (MDT) by Dr Deborah Chalk 2004; b. Gen change 09/2010 (MDT ADDRL1 Adapta DC PPM Ser #:[id number]  . Carotid artery occlusion  2011    60-79%right/ 40-59% left  . GERD (gastroesophageal reflux disease)   . Chronic systolic CHF (congestive heart failure)     a. 03/2011 Echo: EF 40% inflat HK, Gr1 DD, mod MR/TR  . Chest pain March 2014   Past Surgical History  Procedure Laterality Date  . Coronary artery bypass graft  1989  . Pacemaker placement  2004    MDT by Dr Deborah Chalk  . Cataract extraction  2004    Both  . Cardiac catheterization  2004, 2007, 2009  . Eye surgery     Family History  Problem Relation Age of Onset  . Hypertension Mother   . Liver cancer Brother   . Breast cancer Daughter   . Colon cancer Brother   . Cancer Brother     colon   History  Substance Use Topics  . Smoking status: Former Smoker    Quit date: 12/15/1973  . Smokeless tobacco: Never Used  . Alcohol Use: No   OB History   Grav Para Term Preterm Abortions TAB SAB Ect Mult Living                 Review of Systems  Constitutional: Negative for fever, chills and diaphoresis.  Respiratory: Positive for chest tightness and shortness of breath. Negative for cough.   Cardiovascular: Positive for chest pain. Negative for  palpitations and leg swelling.  Gastrointestinal: Negative for nausea, vomiting, abdominal pain, diarrhea and constipation.  Musculoskeletal: Negative for myalgias.  Skin: Negative for rash.  Neurological: Negative for dizziness, weakness, light-headedness, numbness and headaches.  Hematological: Negative for adenopathy. Does not bruise/bleed easily.  All other systems reviewed and are negative.      Allergies  Review of patient's allergies indicates no known allergies.  Home Medications   Current Outpatient Rx  Name  Route  Sig  Dispense  Refill  . amLODipine (NORVASC) 5 MG tablet   Oral   Take 1 tablet (5 mg total) by mouth daily.   90 tablet   3   . aspirin 81 MG tablet   Oral   Take 81 mg by mouth every morning.           Marland Kitchen atorvastatin (LIPITOR) 20 MG tablet   Oral   Take 1 tablet  (20 mg total) by mouth daily.   90 tablet   3   . calcium-vitamin D (OSCAL WITH D) 500-200 MG-UNIT per tablet   Oral   Take 1 tablet by mouth daily.           . clobetasol cream (TEMOVATE) 0.05 %   Topical   Apply 1 application topically as needed. Apply to affected on skin twice a day as needed for rash   30 g   0   . clopidogrel (PLAVIX) 75 MG tablet   Oral   Take 1 tablet (75 mg total) by mouth daily.   90 tablet   3   . esomeprazole (NEXIUM) 40 MG capsule   Oral   Take 40 mg by mouth daily before breakfast.         . furosemide (LASIX) 40 MG tablet      take 1 tablet by mouth once daily   90 tablet   3   . isosorbide mononitrate (IMDUR) 60 MG 24 hr tablet   Oral   Take 1.5 tablets (90 mg total) by mouth daily.   135 tablet   3   . levothyroxine (SYNTHROID, LEVOTHROID) 50 MCG tablet   Oral   Take 50 mcg by mouth daily.           Marland Kitchen losartan (COZAAR) 50 MG tablet   Oral   Take 1 tablet (50 mg total) by mouth daily.   90 tablet   3   . meclizine (ANTIVERT) 12.5 MG tablet   Oral   Take 1 tablet (12.5 mg total) by mouth 3 (three) times daily as needed for dizziness.   30 tablet   0   . metoprolol succinate (TOPROL-XL) 50 MG 24 hr tablet   Oral   Take 50 mg by mouth daily. Take with or immediately following a meal.         . metroNIDAZOLE (METROGEL) 1 % gel   Topical   Apply 1 application topically daily as needed (for vaginal).         . nitroGLYCERIN (NITROLINGUAL) 0.4 MG/SPRAY spray   Sublingual   Place 1 spray under the tongue every 5 (five) minutes x 3 doses as needed for chest pain.         . potassium chloride SA (K-DUR,KLOR-CON) 20 MEQ tablet   Oral   Take 1 tablet (20 mEq total) by mouth every evening.   90 tablet   3   . ranolazine (RANEXA) 1000 MG SR tablet   Oral   Take 1 tablet (1,000 mg total) by  mouth 2 (two) times daily.   180 tablet   3     Med increased   . nitroGLYCERIN (NITROSTAT) 0.4 MG SL tablet      PLACE  1 TABLET UNDER THE TONGUE EVERY 5 MINUTES AS NEEDED FOR CHEST PAIN   90 tablet   3    BP 142/57  Pulse 60  Temp(Src) 98.1 F (36.7 C) (Oral)  Resp 16  Ht 5\' 3"  (1.6 m)  Wt 129 lb (58.514 kg)  BMI 22.86 kg/m2  SpO2 98% Physical Exam  Nursing note and vitals reviewed. Constitutional: She is oriented to person, place, and time. She appears well-developed and well-nourished.  HENT:  Head: Normocephalic and atraumatic.  Right Ear: External ear normal.  Left Ear: External ear normal.  Mouth/Throat: Oropharynx is clear and moist.  Eyes: Conjunctivae and EOM are normal. Pupils are equal, round, and reactive to light.  Neck: Normal range of motion. Neck supple.  Cardiovascular: Normal rate, regular rhythm, normal heart sounds and intact distal pulses.   Pulmonary/Chest: Effort normal and breath sounds normal. No respiratory distress. She has no wheezes. She has no rales. She exhibits no tenderness.  Abdominal: Soft. Bowel sounds are normal. She exhibits no distension and no mass. There is no tenderness. There is no rebound and no guarding.  Musculoskeletal: Normal range of motion.  Neurological: She is alert and oriented to person, place, and time.  Skin: Skin is warm and dry.    ED Course  Procedures (including critical care time) Labs Review Labs Reviewed  CBC WITH DIFFERENTIAL - Abnormal; Notable for the following:    RBC 3.46 (*)    Hemoglobin 11.6 (*)    HCT 32.7 (*)    All other components within normal limits  BASIC METABOLIC PANEL  POCT I-STAT TROPONIN I   Imaging Review Dg Chest Portable 1 View  03/28/2013   CLINICAL DATA:  Chest pain.  EXAM: PORTABLE CHEST - 1 VIEW  COMPARISON:  04/28/2012  FINDINGS: Sternotomy wires a right-sided pacemaker are unchanged. Lungs are hypoinflated but otherwise clear. There is borderline cardiomegaly unchanged. There is calcified plaque over the thoracic aorta and mild degenerative change of the spine.  IMPRESSION: Hypoinflation without  acute cardiopulmonary disease.   Electronically Signed   By: Elberta Fortisaniel  Boyle M.D.   On: 03/28/2013 14:47    EKG Interpretation    Date/Time:  Monday March 28 2013 13:52:25 EST Ventricular Rate:  64 PR Interval:  236 QRS Duration: 171 QT Interval:  484 QTC Calculation: 499 R Axis:   -78 Text Interpretation:  Sinus rhythm Prolonged PR interval LVH with IVCD, LAD and secondary repol abnrm Borderline prolonged QT interval Confirmed by Bebe ShaggyWICKLINE  MD, DONALD [phone]([id number]) on 03/28/2013 2:03:00 PM            MDM   Final diagnoses:  None   80 yo F hx of HTN, HLD, DMII, CAD s/p CABG X 3 (1989), Last Cath 2009, Complete heart block s/p PPM, carotid artery occlusion, chronic systolic CHF (Last EF 40% 2013), presents with CC of chest pain.   Filed Vitals:   03/28/13 1901  BP: 146/52  Pulse: 63  Temp: 98.2 F (36.8 C)  Resp: 18   Physical exam as above.  Pt c/o of 5/10 chest tightness, no other complaints.  Hemodynamically stable.  EKG as above, NSR, LVH w/ IVCD.  CXR shows hypoinflation, without acute cardiopulmonary disease.  CBC and BMP WNL.  Initial troponin WNL.  Pt already has received full ASA  at home.  Pt given nitro SL tablet, and nitropaste, with improvement of chest pain but still slightly persistent.    Pt to be admitted to cardiology service for unstable angina.  Pt understands and agrees with plan.  Jon Gills     Jon Gills, MD 03/28/13 [phone]

## 2013-03-28 NOTE — ED Notes (Signed)
Pt states the nitro has alleviated her chest tightness

## 2013-03-28 NOTE — ED Notes (Signed)
Pt states she has no chest pain, however, does have chest tightness. MD aware. Gave one nitro.

## 2013-03-29 ENCOUNTER — Observation Stay (HOSPITAL_COMMUNITY): Payer: Medicare Other

## 2013-03-29 DIAGNOSIS — R079 Chest pain, unspecified: Secondary | ICD-10-CM

## 2013-03-29 DIAGNOSIS — Z950 Presence of cardiac pacemaker: Secondary | ICD-10-CM

## 2013-03-29 LAB — TROPONIN I

## 2013-03-29 LAB — CBC
HEMATOCRIT: 34.5 % — AB (ref 36.0–46.0)
HEMOGLOBIN: 12 g/dL (ref 12.0–15.0)
MCH: 33.1 pg (ref 26.0–34.0)
MCHC: 34.8 g/dL (ref 30.0–36.0)
MCV: 95 fL (ref 78.0–100.0)
Platelets: 236 10*3/uL (ref 150–400)
RBC: 3.63 MIL/uL — ABNORMAL LOW (ref 3.87–5.11)
RDW: 13.6 % (ref 11.5–15.5)
WBC: 5.9 10*3/uL (ref 4.0–10.5)

## 2013-03-29 LAB — BASIC METABOLIC PANEL
BUN: 22 mg/dL (ref 6–23)
CHLORIDE: 98 meq/L (ref 96–112)
CO2: 26 mEq/L (ref 19–32)
Calcium: 9.9 mg/dL (ref 8.4–10.5)
Creatinine, Ser: 1.15 mg/dL — ABNORMAL HIGH (ref 0.50–1.10)
GFR calc Af Amer: 48 mL/min — ABNORMAL LOW (ref >90)
GFR calc non Af Amer: 42 mL/min — ABNORMAL LOW (ref >90)
Glucose, Bld: 83 mg/dL (ref 70–99)
Potassium: 5.4 mEq/L — ABNORMAL HIGH (ref 3.7–5.3)
Sodium: 138 mEq/L (ref 137–147)

## 2013-03-29 LAB — LIPID PANEL
Cholesterol: 150 mg/dL (ref 0–200)
HDL: 56 mg/dL (ref >39)
LDL CALC: 76 mg/dL (ref 0–99)
TRIGLYCERIDES: 91 mg/dL (ref <150)
Total CHOL/HDL Ratio: 2.7 RATIO
VLDL: 18 mg/dL (ref 0–40)

## 2013-03-29 LAB — TSH: TSH: 5.734 u[IU]/mL — ABNORMAL HIGH (ref 0.350–4.500)

## 2013-03-29 MED ORDER — REGADENOSON 0.4 MG/5ML IV SOLN
INTRAVENOUS | Status: AC
  Administered 2013-03-29: 0.4 mg via INTRAVENOUS
  Filled 2013-03-29: qty 5

## 2013-03-29 MED ORDER — REGADENOSON 0.4 MG/5ML IV SOLN
0.4000 mg | Freq: Once | INTRAVENOUS | Status: AC
  Administered 2013-03-29: 0.4 mg via INTRAVENOUS
  Filled 2013-03-29: qty 5

## 2013-03-29 MED ORDER — ISOSORBIDE MONONITRATE ER 120 MG PO TB24: 120.0000 mg | ORAL_TABLET | Freq: Every day | ORAL | Status: DC

## 2013-03-29 MED ORDER — TECHNETIUM TC 99M SESTAMIBI GENERIC - CARDIOLITE
10.0000 | Freq: Once | INTRAVENOUS | Status: AC | PRN
  Administered 2013-03-29: 10 via INTRAVENOUS

## 2013-03-29 MED ORDER — TECHNETIUM TC 99M SESTAMIBI GENERIC - CARDIOLITE
30.0000 | Freq: Once | INTRAVENOUS | Status: AC | PRN
  Administered 2013-03-29: 30 via INTRAVENOUS

## 2013-03-29 NOTE — ED Provider Notes (Signed)
Medical screening examination/treatment/procedure(s) were conducted as a shared visit with non-physician practitioner(s) or resident and myself. I personally evaluated the patient during the encounter and agree with the findings and plan unless otherwise indicated.  I have personally reviewed any xrays and/ or EKG's with the provider and I agree with interpretation.  High risk cardiac patient with known cabg, CAD, stent in the past. Worsening chest tightness, exertional component, lasted longer than her normal. Pain free on exam, nitro had improved. Pt had asa PTA. Lungs clear, RRR, abd soft./ NT, no leg swelling/ pain, non toxic appearing. Concern for unstable angina, cardiology consult. Nitro in ED.  EKG Interpretation    Date/Time: Monday March 28 2013 13:52:25 EST  Ventricular Rate: 64  PR Interval: 236  QRS Duration: 171  QT Interval: 484  QTC Calculation: 499  R Axis: -78  Text Interpretation: Sinus rhythm Prolonged PR interval LVH with IVCD, LAD and secondary repol abnrm Borderline prolonged QT interval Confirmed by Bebe Shaggy MD, DONALD [phone]) on 03/28/2013 2:03:00 PM   Acute chest pain, unstable angina  Enid Skeens, MD 03/29/13 [phone]

## 2013-03-29 NOTE — Progress Notes (Signed)
Lexiscan myoview completed without complications.  Nuc studies to follow.  Nada Boozer, FNP-C [facility]  Pgr:[phone] or after 5pm and on weekends call [phone] 03/29/2013.now

## 2013-03-29 NOTE — Progress Notes (Signed)
Results of lexiscan called to [place], [place]

## 2013-03-29 NOTE — Discharge Summary (Signed)
Physician Discharge Summary     Patient ID: Sydney Moore MRN: [id number] DOB/AGE: 1923/04/18 80 y.o.  PCP: Rene PaciValerie Leschber, MD Cardiologist: Nahser  Admit date: 03/28/2013 Discharge date: 03/29/2013  Admission Diagnoses:  Chest pain  Discharge Diagnoses:  Principal Problem:   Chest pain Active Problems:   HYPOTHYROIDISM   DIABETES MELLITUS   HYPERLIPIDEMIA   HYPERTENSION, BENIGN ESSENTIAL   Coronary artery disease   Pacemaker-Medtronic   Discharged Condition: stable  Hospital Course:    Sydney Moore is a 80 y.o. female with a history of HTN, HLD, hypothyroidism, GERD, chronic systolic congestive heart failure (EF 40%- ECHO 2013), chronic chest pain on Ranexa, CAD s/p 1989 CABGx3; 2009 cath/attempted PCI, carotid artery disease being followed and PPM for complete heart block (2004) who [facility] complaining of chest pain. She was recently seen by Dr. Elease HashimotoNahser on Feb 9th for a regular office follow up. She had reported stable exertional angina relieved with SL NTG at that time. She has known disease in the ramus branch and was able to do all of her normal daily activities without significant limitations with NTG.   Patient has had a cough for a couple of days and not sleeping well. She started having off and on chest pain since yesterday which has become worse today. Today she had tightness and pressure in her chest that was not resolved by NTG, which prompted her to come to the ED. She has no SOB, diaphoresis, or n/v. She did notice that she felt gassy yesterday which is reminiscent of her previous cardiac pain before her CABG in 1989. No palpitations, orthopnea or lower extremity edema.   She was admitted for observation.  Imdur was increased to 120mg  and toprol XL increased.   Troponin was checked twice and negative both times.   Her CP resolved.   Lexiscan myovue revealed a fixed small, moderate-intensity apical septal and apical anterior perfusion defect.  No ischemia  noted.  EF 35% and by previous echo in Feb 2013 was 40%.  Toprol XL will be decreased back to previous home dosing.  The patient was seen by Dr. Royann Shiversroitoru who felt she was stable for DC home.   Consults: None  Significant Diagnostic Studies:   EXAM: Lexiscan Myovue  TECHNIQUE: The patient received IV Lexiscan .4mg  over 15 seconds. 33.0 mCi of Technetium 5556m Sestamibi injected at 30 seconds. Quantitative SPECT images were obtained in the vertical, horizontal and short axis planes after a 45 minute delay. Rest images were obtained with similar planes and delay using 10.2 mCi of Technetium 7656m Sestamibi.  FINDINGS: ECG: V-paced rhythm  Symptoms: None reported.  Quantitiative Gated SPECT EF: 35% with peri-apical severe hypokinesis noted.  Perfusion Images: There was a small, moderate-intensity apical septal and apical anterior perfusion defect noted both at rest and with Lexiscan stress.  IMPRESSION: 1. EF 35% with peri-apical severe hypokinesis.  2. Fixed small, moderate-intensity apical septal and apical anterior perfusion defect. Given wall motion abnormalities in the same area, concerned for prior infarction. No definite ischemia noted. Intermediate risk study with low EF. Would consider echo to confirm EF.  Dalton Mclean    Treatments:   Discharge Exam: Blood pressure 141/34, pulse 60, temperature 97.7 F (36.5 C), temperature source Oral, resp. rate 18, height 5\' 3"  (1.6 m), weight 134 lb (60.782 kg), SpO2 98.00%.   Disposition: 01-Home or Self Care      Discharge Orders   Future Appointments Provider Department Dept Phone   03/30/2013  10:30 AM Newt Lukes, MD [facility] [phone]   04/06/2013 1:30 PM Newt Lukes, MD [facility] Primary [facility] [phone]   06/20/2013 10:45 AM [facility] [place] Office [phone]   08/01/2013 11:00 AM [facility]  IMAGING [address] [phone]   08/01/2013 12:00 PM Carma Lair Nickel, NP [facility] [phone]   Future Orders Complete By Expires   Diet - low sodium heart healthy  As directed    Increase activity slowly  As directed        Medication List         amLODipine 5 MG tablet  Commonly known as:  NORVASC  Take 1 tablet (5 mg total) by mouth daily.     aspirin 81 MG tablet  Take 81 mg by mouth every morning.     atorvastatin 20 MG tablet  Commonly known as:  LIPITOR  Take 1 tablet (20 mg total) by mouth daily.     calcium-vitamin D 500-200 MG-UNIT per tablet  Commonly known as:  OSCAL WITH D  Take 1 tablet by mouth daily.     clobetasol cream 0.05 %  Commonly known as:  TEMOVATE  Apply 1 application topically as needed. Apply to affected on skin twice a day as needed for rash     clopidogrel 75 MG tablet  Commonly known as:  PLAVIX  Take 1 tablet (75 mg total) by mouth daily.     esomeprazole 40 MG capsule  Commonly known as:  NEXIUM  Take 40 mg by mouth daily before breakfast.     furosemide 40 MG tablet  Commonly known as:  LASIX  take 1 tablet by mouth once daily     isosorbide mononitrate 120 MG 24 hr tablet  Commonly known as:  IMDUR  Take 1 tablet (120 mg total) by mouth daily.     levothyroxine 50 MCG tablet  Commonly known as:  SYNTHROID, LEVOTHROID  Take 50 mcg by mouth daily.     losartan 50 MG tablet  Commonly known as:  COZAAR  Take 1 tablet (50 mg total) by mouth daily.     meclizine 12.5 MG tablet  Commonly known as:  ANTIVERT  Take 1 tablet (12.5 mg total) by mouth 3 (three) times daily as needed for dizziness.     metoprolol succinate 50 MG 24 hr tablet  Commonly known as:  TOPROL-XL  Take 50 mg by mouth daily. Take with or immediately following a meal.     metroNIDAZOLE 1 % gel  Commonly known as:  METROGEL  Apply 1 application topically daily as needed (for vaginal).     nitroGLYCERIN 0.4 MG/SPRAY spray    Commonly known as:  NITROLINGUAL  Place 1 spray under the tongue every 5 (five) minutes x 3 doses as needed for chest pain.     nitroGLYCERIN 0.4 MG SL tablet  Commonly known as:  NITROSTAT  PLACE 1 TABLET UNDER THE TONGUE EVERY 5 MINUTES AS NEEDED FOR CHEST PAIN     potassium chloride SA 20 MEQ tablet  Commonly known as:  K-DUR,KLOR-CON  Take 1 tablet (20 mEq total) by mouth every evening.     ranolazine 1000 MG SR tablet  Commonly known as:  RANEXA  Take 1 tablet (1,000 mg total) by mouth 2 (two) times daily.       Follow-up Information   Follow up with Elyn Aquas., MD. (The office  will call you with appt date and time.)    Specialty:  Cardiology   Contact information:   [address] [place] [address] [phone]       Signed: Wilburt Finlay 03/29/2013, 4:04 PM

## 2013-03-29 NOTE — Progress Notes (Signed)
UR completed 

## 2013-03-29 NOTE — Progress Notes (Signed)
Subjective: No further chest tightness.  Objective: Vital signs in last 24 hours: Temp:  [98.1 F (36.7 C)-98.8 F (37.1 C)] 98.8 F (37.1 C) (02/17 0435) Pulse Rate:  [60-67] 67 (02/17 0435) Resp:  [16-20] 16 (02/17 0435) BP: (136-153)/(52-62) 139/62 mmHg (02/17 0435) SpO2:  [95 %-100 %] 97 % (02/17 0435) Weight:  [129 lb (58.514 kg)-134 lb (60.782 kg)] 134 lb (60.782 kg) (02/17 0435) Last BM Date: 03/28/13  Intake/Output from previous day: 02/16 0701 - 02/17 0700 In: 240 [P.O.:240] Out: 300 [Urine:300] Intake/Output this shift:    Medications Current Facility-Administered Medications  Medication Dose Route Frequency Provider Last Rate Last Dose  . 0.9 %  sodium chloride infusion  250 mL Intravenous PRN Sydney Parkin, PA-C      . amLODipine (NORVASC) tablet 5 mg  5 mg Oral Daily Sydney Parkin, PA-C      . aspirin EC tablet 81 mg  81 mg Oral Daily Sydney Morale, MD      . atorvastatin (LIPITOR) tablet 20 mg  20 mg Oral Daily Sydney Parkin, PA-C   20 mg at 03/28/13 2047  . clopidogrel (PLAVIX) tablet 75 mg  75 mg Oral Daily Sydney Parkin, PA-C      . enoxaparin (LOVENOX) injection 40 mg  40 mg Subcutaneous Q24H Sydney Parkin, PA-C   40 mg at 03/28/13 2048  . furosemide (LASIX) tablet 40 mg  40 mg Oral Daily Sydney Parkin, PA-C      . isosorbide mononitrate (IMDUR) 24 hr tablet 120 mg  120 mg Oral Daily Sydney Parkin, PA-C      . levothyroxine (SYNTHROID, LEVOTHROID) tablet 50 mcg  50 mcg Oral QAC breakfast Sydney Parkin, PA-C   50 mcg at 03/29/13 0349  . losartan (COZAAR) tablet 50 mg  50 mg Oral Daily Sydney Parkin, PA-C      . meclizine (ANTIVERT) tablet 12.5 mg  12.5 mg Oral TID PRN Sydney Parkin, PA-C      . metoprolol succinate (TOPROL-XL) 24 hr tablet 50 mg  50 mg Oral BID Sydney Parkin, PA-C   50 mg at 03/28/13 2133  . nitroGLYCERIN (NITROLINGUAL) 0.4 MG/SPRAY spray 1 spray  1 spray Sublingual Q5 Min x 3 PRN Sydney Parkin, PA-C      . pantoprazole (PROTONIX) EC  tablet 80 mg  80 mg Oral Q1200 Sydney Parkin, PA-C      . potassium chloride SA (K-DUR,KLOR-CON) CR tablet 20 mEq  20 mEq Oral QPM Sydney Parkin, PA-C   20 mEq at 03/28/13 2047  . ranolazine (RANEXA) 12 hr tablet 1,000 mg  1,000 mg Oral BID Sydney Morale, MD   1,000 mg at 03/28/13 2133  . sodium chloride 0.9 % injection 3 mL  3 mL Intravenous Q12H Sydney Parkin, PA-C   3 mL at 03/28/13 2133  . sodium chloride 0.9 % injection 3 mL  3 mL Intravenous PRN Sydney Parkin, PA-C        PE: General appearance: alert, cooperative and no distress Lungs: clear to auscultation bilaterally Heart: regular rate and rhythm, S1, S2 normal, no murmur, click, rub or gallop Extremities: No LEE Pulses: 2+ and symmetric Skin: Warm and dry Neurologic: Grossly normal  Lab Results:   Recent Labs  03/28/13 1439 03/28/13 2144 03/29/13 0427  WBC 5.9 4.7 5.9  HGB 11.6* 10.9* 12.0  HCT 32.7* 30.7* 34.5*  PLT 224 210 236   BMET  Recent Labs  03/28/13 1439 03/28/13 2144 03/29/13 0427  NA 138  --  138  K 4.3  --  5.4*  CL 96  --  98  CO2 29  --  26  GLUCOSE 96  --  83  BUN 15  --  22  CREATININE 1.14* 1.11* 1.15*  CALCIUM 9.9  --  9.9   PT/INR No results found for this basename: LABPROT, INR,  in the last 72 hours Cholesterol  Recent Labs  03/29/13 0427  CHOL 150   Lipid Panel     Component Value Date/Time   CHOL 150 03/29/2013 0427   TRIG 91 03/29/2013 0427   HDL 56 03/29/2013 0427   CHOLHDL 2.7 03/29/2013 0427   VLDL 18 03/29/2013 0427   LDLCALC 76 03/29/2013 0427   Cardiac Panel (last 3 results)  Recent Labs  03/28/13 1619  TROPONINI <0.30      Assessment/Plan  Principal Problem:   Chest pain Active Problems:   HYPOTHYROIDISM   DIABETES MELLITUS   HYPERLIPIDEMIA   HYPERTENSION, BENIGN ESSENTIAL   Coronary artery disease   Pacemaker-Medtronic   Plan:  80 y.o. female with a history of HTN, HLD, hypothyroidism, GERD, chronic systolic congestive heart failure (EF 40%-  ECHO 2013), chronic chest pain on Ranexa, CAD s/p 1989 CABGx3; 2009 cath/attempted PCI, carotid artery disease being followed and PPM for complete heart block (2004) who [facility] complaining of chest pain.  Troponin x1 negative.  No other troponin pending.  Will add this morning.  Metoprolol to 50 BID and 120mg  Imdur added yesterday.   Will order lexiscan per Dr. Alford Moore's note.     AV pacing on tele.  Glucose controlled.  Bp looks good.   LOS: 1 day    Moore, Sydney 03/29/2013 8:14 AM  I have seen and examined the patient along with Sydney FinlayBryan [name], PA.  I have reviewed the chart, notes and new data.  I agree with PA's note.  Key new complaints: now asymptomatic Key examination changes: no clinical signs of CHF Key new findings / data: nuclear study shows fixed perfusion defects in the apical anterior and apical septal distribution, without reversibility and with wall motion abnormalities in the same distribution. The EF is 35%. Normal cardiac enzymes. ECG nondiagnostic due to obligatory RV pacing  PLAN: The EF is similar to echo in 2014 (40%) and identical to an old nuclear study from 2007 [facility]). I suspect the perfusion abnormalities could well be RV pacing related artifact. More importantly, there is no reversibility to suggest possible revascularization benefit. Would not pursue cath in this 80 year old who is now asymptomatic. I did advise her to always seek attention for chest discomfort lasting >25-30 minutes, not relieved by SL NTG x 3. Despite low EF, she does not have CHF manifestations. If this were to occur, she would be an appropriate candidate for CRT upgrade. Follow up with Sydney Moore, who has been her Cardiologist for 20 years  Sydney FairMihai Brynlei Klausner, MD, [facility] [phone] 03/29/2013, 3:44 PM

## 2013-03-30 ENCOUNTER — Ambulatory Visit: Payer: Medicare Other | Admitting: Internal Medicine

## 2013-03-31 LAB — MDC_IDC_ENUM_SESS_TYPE_REMOTE
Brady Statistic AP VP Percent: 94.7 %
Brady Statistic AP VS Percent: 0.1 %
Brady Statistic AS VP Percent: 5.3 %
Lead Channel Impedance Value: 617 Ohm
Lead Channel Pacing Threshold Amplitude: 0.375 V
Lead Channel Pacing Threshold Pulse Width: 0.4 ms
Lead Channel Setting Pacing Amplitude: 2 V
Lead Channel Setting Sensing Sensitivity: 4 mV
MDC IDC MSMT LEADCHNL RA IMPEDANCE VALUE: 453 Ohm
MDC IDC MSMT LEADCHNL RA PACING THRESHOLD AMPLITUDE: 0.75 V
MDC IDC MSMT LEADCHNL RA PACING THRESHOLD PULSEWIDTH: 0.4 ms
MDC IDC SET LEADCHNL RV PACING AMPLITUDE: 2.5 V
MDC IDC SET LEADCHNL RV PACING PULSEWIDTH: 0.4 ms
MDC IDC STAT BRADY AS VS PERCENT: 0.1 %

## 2013-04-06 ENCOUNTER — Ambulatory Visit: Payer: Medicare Other | Admitting: Internal Medicine

## 2013-04-07 ENCOUNTER — Ambulatory Visit: Payer: Medicare Other | Admitting: Internal Medicine

## 2013-04-07 DIAGNOSIS — Z0289 Encounter for other administrative examinations: Secondary | ICD-10-CM

## 2013-04-08 ENCOUNTER — Ambulatory Visit: Payer: Medicare Other | Admitting: Internal Medicine

## 2013-04-11 ENCOUNTER — Encounter: Payer: Self-pay | Admitting: *Deleted

## 2013-04-11 ENCOUNTER — Other Ambulatory Visit (INDEPENDENT_AMBULATORY_CARE_PROVIDER_SITE_OTHER): Payer: Medicare Other

## 2013-04-11 ENCOUNTER — Encounter: Payer: Self-pay | Admitting: Internal Medicine

## 2013-04-11 ENCOUNTER — Ambulatory Visit (INDEPENDENT_AMBULATORY_CARE_PROVIDER_SITE_OTHER): Payer: Medicare Other | Admitting: Internal Medicine

## 2013-04-11 VITALS — BP 140/60 | HR 90 | Temp 98.1°F | Resp 14 | Wt 131.0 lb

## 2013-04-11 DIAGNOSIS — E039 Hypothyroidism, unspecified: Secondary | ICD-10-CM

## 2013-04-11 DIAGNOSIS — I10 Essential (primary) hypertension: Secondary | ICD-10-CM

## 2013-04-11 DIAGNOSIS — I200 Unstable angina: Secondary | ICD-10-CM

## 2013-04-11 DIAGNOSIS — D649 Anemia, unspecified: Secondary | ICD-10-CM

## 2013-04-11 DIAGNOSIS — I6529 Occlusion and stenosis of unspecified carotid artery: Secondary | ICD-10-CM

## 2013-04-11 DIAGNOSIS — Z862 Personal history of diseases of the blood and blood-forming organs and certain disorders involving the immune mechanism: Secondary | ICD-10-CM

## 2013-04-11 LAB — CBC WITH DIFFERENTIAL/PLATELET
BASOS PCT: 0.6 % (ref 0.0–3.0)
Basophils Absolute: 0 10*3/uL (ref 0.0–0.1)
Eosinophils Absolute: 0.1 10*3/uL (ref 0.0–0.7)
Eosinophils Relative: 1.4 % (ref 0.0–5.0)
HCT: 34.8 % — ABNORMAL LOW (ref 36.0–46.0)
HEMOGLOBIN: 11.6 g/dL — AB (ref 12.0–15.0)
Lymphocytes Relative: 31 % (ref 12.0–46.0)
Lymphs Abs: 1.7 10*3/uL (ref 0.7–4.0)
MCHC: 33.2 g/dL (ref 30.0–36.0)
MCV: 98.8 fl (ref 78.0–100.0)
MONOS PCT: 14.4 % — AB (ref 3.0–12.0)
Monocytes Absolute: 0.8 10*3/uL (ref 0.1–1.0)
NEUTROS ABS: 2.9 10*3/uL (ref 1.4–7.7)
Neutrophils Relative %: 52.6 % (ref 43.0–77.0)
Platelets: 275 10*3/uL (ref 150.0–400.0)
RBC: 3.53 Mil/uL — AB (ref 3.87–5.11)
RDW: 14.3 % (ref 11.5–14.6)
WBC: 5.4 10*3/uL (ref 4.5–10.5)

## 2013-04-11 LAB — VITAMIN B12: Vitamin B-12: 515 pg/mL (ref 211–911)

## 2013-04-11 LAB — TSH: TSH: 3.92 u[IU]/mL (ref 0.35–5.50)

## 2013-04-11 LAB — FERRITIN: Ferritin: 194.7 ng/mL (ref 10.0–291.0)

## 2013-04-11 NOTE — Assessment & Plan Note (Signed)
   Blood pressure elevated but of concern is demonstrable postural hypotension with drop in systolic pressure of almost 20 points.  Isometric exercises recommended. She may need to wear supportive is if symptoms persist .

## 2013-04-11 NOTE — Assessment & Plan Note (Signed)
No change as TSH minimally incereased ; recheck TSH in 10 weeks

## 2013-04-11 NOTE — Progress Notes (Signed)
Pre visit review using our clinic review tool, if applicable. No additional management support is needed unless otherwise documented below in the visit note. 

## 2013-04-11 NOTE — Progress Notes (Signed)
Subjective:    Patient ID: Sydney Moore, female    DOB: 05/21/1923, 80 y.o.   MRN: [id number]  HPI Hospital records 2/16-2/17/15 were reviewed. She was observed for angina. Serial troponins were normal x2.  She was found to have an anemia which was stable. There is minimal increase in her creatinine.  Her TSH was 5.734 on 50 mcg of levothyroxine daily. She is not on amiodarone.  On 2 /28/15 she was dizzy for a short period while standing after her ears popped.   Review of Systems  she denies epistaxis, hemoptysis, hematuria,or melena.rare rectal bleeding with straining.She has noprogressive weight loss, dysphagia, or abdominal pain. Chest pain, palpitations, edema, calf pain, PNDyspnea or claudication are denied. Cough, dyspnea, sputum production, or hemoptysis are absent. No associated weakness, numbness/tingling in arms. Absent are dyspepsia, significant reflux or dysphagia. No rash or skin lesions absent. No history of abnormal bruising or bleeding.      Objective:   Physical Exam Gen.: Healthy and well-nourished in appearance. Alert, appropriate and cooperative throughout exam. Appears much younger than stated age  Head: Normocephalic without obvious abnormalities  Eyes: No corneal or conjunctival inflammation noted. Pupils equal round reactive to light and accommodation. Extraocular motion intact w/o nystagmus. Arcus senilis Ears: External  ear exam reveals no significant lesions or deformities. Canals clear .TMs normal. Hearing is grossly normal bilaterally. Nose: External nasal exam reveals no deformity or inflammation. Nasal mucosa are pink and moist. No lesions or exudates noted.   Mouth: Oral mucosa and oropharynx reveal no lesions or exudates. Teeth in good repair. Neck: No deformities, masses, or tenderness noted. Thyroid normal. Lungs: Normal respiratory effort; chest expands symmetrically. Lungs are clear to auscultation without rales, wheezes, or increased work of  breathing. Heart: Normal rate and rhythm. Normal S1 and S2. No gallop, click, or rub. Grade 1/6 systolic murmur Abdomen: Bowel sounds normal; abdomen soft and nontender. No masses, organomegaly or hernias noted.                                Musculoskeletal/extremities: No deformity or scoliosis noted of  the thoracic or lumbar spine.  No clubbing, cyanosis, edema, or significant extremity  deformity noted. Range of motion normal .Tone & strength normal. Hand joints  reveal isolated DIP changes.  Fingernail / toenail health good. Able to lie down & sit up w/o help. Negative SLR bilaterally Vascular: Carotid, radial artery, dorsalis pedis and  posterior tibial pulses are  Equal; decreased pedal pulses. L & R  carotid  bruit present. Neurologic: Alert and oriented x3. Deep tendon reflexes symmetrical and normal.  Gait normal  . Rhomberg & finger to nose       Skin: Intact without suspicious lesions or rashes. Lymph: No cervical, axillary lymphadenopathy present. Psych: Mood and affect are normal. Normally interactive          Supine BP:170/72 Sitting BP:165/72 Standing BP: 150/62 No change in pulse  Assessment & Plan:  See Current Assessment & Plan in Problem List under specific Diagnosis

## 2013-04-11 NOTE — Assessment & Plan Note (Signed)
Quiescent @ present

## 2013-04-11 NOTE — Assessment & Plan Note (Signed)
B12 & ferritin

## 2013-04-11 NOTE — Patient Instructions (Signed)
Your next office appointment will be determined based upon review of your pending labs . Those instructions will be transmitted to you  by mail.  Please report any significant change in your symptoms. Repeat the isometric exercises discussed 4- 5 times prior to standing if you've been seated for a period of time.

## 2013-04-13 ENCOUNTER — Encounter: Payer: Self-pay | Admitting: Cardiovascular Disease

## 2013-04-13 ENCOUNTER — Ambulatory Visit (INDEPENDENT_AMBULATORY_CARE_PROVIDER_SITE_OTHER): Payer: Medicare Other | Admitting: Cardiovascular Disease

## 2013-04-13 VITALS — BP 140/70 | HR 90 | Ht 63.0 in | Wt 132.2 lb

## 2013-04-13 DIAGNOSIS — I2510 Atherosclerotic heart disease of native coronary artery without angina pectoris: Secondary | ICD-10-CM

## 2013-04-13 DIAGNOSIS — I6529 Occlusion and stenosis of unspecified carotid artery: Secondary | ICD-10-CM

## 2013-04-13 MED ORDER — ATORVASTATIN CALCIUM 20 MG PO TABS: 20.0000 mg | ORAL_TABLET | Freq: Every day | ORAL | Status: DC

## 2013-04-13 NOTE — Assessment & Plan Note (Signed)
She remains stable.  She continues angina on occasion. She takes sublingual nitroglycerin fairly frequently. Her nitroglycerin intake has decreased some sense her isosorbide was increased but she continues to have episodes of angina. She has a known tight ramus intermediate branch that we were unable to open several years ago.  At this point she sounds very stable. Will continue with her same medications. I'll see her again in 3 months for followup office visit.

## 2013-04-13 NOTE — Progress Notes (Signed)
Sydney ReachBertha L Moore Date of Birth  03/23/1923 [facility] [address]     [address] [place]  [id number] [phone]  Fax  [phone]   Problems: 1. CAD - s/p CABG 2. Pacer - for advanced AV block 3. PVD- left carotid bruit 4. CHF - EF of 40% 5. Diabetes Mellitus 6. Hypothyroidism   History of Present Illness:  80 year old female with a history of coronary artery disease and pacemaker implant.  She has a history of peripheral vascular disease and has a known left carotid bruit which we are following.   She's had a history of coronary artery bypass grafting. She denies any shortness of breath or chest pain. She does complain of some leg weakness particularly with walking.  Check pacer generator changed in October. She has developed some keloid formation over the pacer incision  A recent echocardiogram reveals mild to moderate left ventricular dysfunction with an ejection fraction of 40%.  There is moderate mitral regurgitation and moderate tricuspid regurgitation. Right ventricle appears to be dilated. Her pulmonary pressures are at the upper limits of normal at 36 mmHg. Her Lasix dose was increased by Dr. Ladona Ridgelaylor and she's feeling a bit better.  Septeber 17, 2013 - she has done well.  She has occasional episodes of CP when walking - goes away very quickly with a SL NTG.  These episodes of chest pain are very rare. She's able to do most of her normal activities without any significant problems. She has known stenosis in her ramus intermediate branch that was not amenable to PCI. We were not able to put a wire down that vessel. At that point she was started on Ranexa and has done well since that time.  June 27 ,2014: [name] is doing well.  She has occasional CP and takes NTG occasionally.  Feb. 9, 2015: [name] is doing ok.  She still has some exertional angina relieved with SL NTG.   She is able to do all of her normal daily activities  without significant limitations as long as she takes the nitroglycerin.   She is on Ranexa 1000 BID.    Her BP is high this am - she has not taken her BP pills yet.  April 13, 2013:  Sydney MccreedyBarbara has been admitted to the hospital since I last saw her. She had a stress Myoview study which revealed a fixed apical defect and no ischemia.  Ejection fraction remained moderately reduced at 35%.  The isosorbide was increased to 120 mg a day.  She continues to have some mild chest pain but thinks that the episodes are decreased from before the hospitalization. She does use sublingual nitroglycerin on a daily basis that several times a day intact  Current Outpatient Prescriptions on File Prior to Visit  Medication Sig Dispense Refill  . amLODipine (NORVASC) 5 MG tablet Take 1 tablet (5 mg total) by mouth daily.  90 tablet  3  . aspirin 81 MG tablet Take 81 mg by mouth every morning.        Marland Kitchen. atorvastatin (LIPITOR) 20 MG tablet Take 1 tablet (20 mg total) by mouth daily.  90 tablet  3  . calcium-vitamin D (OSCAL WITH D) 500-200 MG-UNIT per tablet Take 1 tablet by mouth daily.        . clobetasol cream (TEMOVATE) 0.05 % Apply 1 application topically as needed. Apply to affected on skin twice a day as needed for rash  30 g  0  . clopidogrel (PLAVIX) 75 MG tablet Take 1 tablet (75 mg total) by mouth daily.  90 tablet  3  . esomeprazole (NEXIUM) 40 MG capsule Take 40 mg by mouth daily before breakfast.      . furosemide (LASIX) 40 MG tablet take 1 tablet by mouth once daily  90 tablet  3  . isosorbide mononitrate (IMDUR) 120 MG 24 hr tablet Take 1 tablet (120 mg total) by mouth daily.  30 tablet  5  . levothyroxine (SYNTHROID, LEVOTHROID) 50 MCG tablet Take 50 mcg by mouth daily.        Marland Kitchen losartan (COZAAR) 50 MG tablet Take 1 tablet (50 mg total) by mouth daily.  90 tablet  3  . meclizine (ANTIVERT) 12.5 MG tablet Take 1 tablet (12.5 mg total) by mouth 3 (three) times daily as needed for dizziness.  30 tablet  0   . metoprolol succinate (TOPROL-XL) 50 MG 24 hr tablet Take 50 mg by mouth daily. Take with or immediately following a meal.      . metroNIDAZOLE (METROGEL) 1 % gel Apply 1 application topically daily as needed (for vaginal).      . nitroGLYCERIN (NITROLINGUAL) 0.4 MG/SPRAY spray Place 1 spray under the tongue every 5 (five) minutes x 3 doses as needed for chest pain.      . potassium chloride SA (K-DUR,KLOR-CON) 20 MEQ tablet Take 1 tablet (20 mEq total) by mouth every evening.  90 tablet  3  . ranolazine (RANEXA) 1000 MG SR tablet Take 1 tablet (1,000 mg total) by mouth 2 (two) times daily.  180 tablet  3   No current facility-administered medications on file prior to visit.    No Known Allergies  Past Medical History  Diagnosis Date  . Hypertension   . Hyperlipidemia   . Diabetes mellitus   . Chronic cough     Seen by Dr. Sherene Sires  . Hypothyroid   . CAD (coronary artery disease) 1989    a. 1989: s/p CABGx3;  b. 06/2007 Cath/Attempted PCI: LM min irregs, LAD 90/100, D1 90 (attempted PCI), RI 90, LCX100p, OM1 100, RCA 60/70p, 90/38m, VG->RCA nl, VG->OM 100, LIMA->LAD nl, EF 50%.  . Complete heart block     a. s/p PPM (MDT) by Dr Deborah Chalk 2004; b. Gen change 09/2010 (MDT ADDRL1 Adapta DC PPM Ser #:[id number]  . Carotid artery occlusion 2011    60-79%right/ 40-59% left  . GERD (gastroesophageal reflux disease)   . Chronic systolic CHF (congestive heart failure)     a. 03/2011 Echo: EF 40% inflat HK, Gr1 DD, mod MR/TR  . Chest pain March 2014    Past Surgical History  Procedure Laterality Date  . Coronary artery bypass graft  1989  . Pacemaker placement  2004    MDT by Dr Deborah Chalk  . Cataract extraction  2004    Both  . Cardiac catheterization  2004, 2007, 2009  . Eye surgery      History  Smoking status  . Former Smoker  . Quit date: 12/15/1973  Smokeless tobacco  . Never Used    History  Alcohol Use No    Family History  Problem Relation Age of Onset  . Hypertension  Mother   . Liver cancer Brother   . Breast cancer Daughter   . Colon cancer Brother   . Cancer Brother     colon    Reviw of Systems:  Reviewed in the HPI.  All other systems are negative.  Physical Exam: BP 140/70  Pulse 90  Ht 5\' 3"  (1.6 m)  Wt 132 lb 3.2 oz (59.966 kg)  BMI 23.42 kg/m2 The patient is alert and oriented x 3.  The mood and affect are normal.   Skin: warm and dry.  Color is normal.    HEENT:   the sclera are nonicteric.  The mucous membranes are moist.  The carotids are 2+ with soft bruits. .  There is no thyromegaly.  Pulsatile next is inferior to her carotid arteries that are either do to a large V wave or perhaps do to her common carotid artery.  Lungs: clear.  The chest wall is non tender.    Heart: regular rate with a normal S1 and S2.  There is a soft systolic murmur The PMI is not displaced.  Her pacer is in the right subclavian and has a small amount of keloid.   Abdomen: good bowel sounds.  There is no guarding or rebound.  There is no hepatosplenomegaly or tenderness.  There are no masses.   Extremities:  no clubbing, cyanosis, or edema.  The legs are without rashes.  The distal pulses are diminished  Neuro:  Cranial nerves II - XII are intact.  Motor and sensory functions are intact.    The gait is normal.  ECG:     Assessment / Plan:

## 2013-04-13 NOTE — Patient Instructions (Signed)
Your physician recommends that you schedule a follow-up appointment in: 3 MONTHS  Your physician recommends that you continue on your current medications as directed. Please refer to the Current Medication list given to you today.   

## 2013-04-14 ENCOUNTER — Encounter: Payer: Self-pay | Admitting: Internal Medicine

## 2013-04-18 ENCOUNTER — Telehealth: Payer: Self-pay

## 2013-04-18 NOTE — Telephone Encounter (Signed)
The patient called hoping to get a cough med called in for her.    Callback - [phone]

## 2013-04-19 MED ORDER — BENZONATATE 100 MG PO CAPS: 100.0000 mg | ORAL_CAPSULE | Freq: Two times a day (BID) | ORAL | Status: DC | PRN

## 2013-04-19 NOTE — Telephone Encounter (Signed)
Tessalon bid prn - ex done

## 2013-04-19 NOTE — Telephone Encounter (Signed)
Notified pt with md response.../lmb 

## 2013-05-06 ENCOUNTER — Ambulatory Visit (INDEPENDENT_AMBULATORY_CARE_PROVIDER_SITE_OTHER): Payer: Medicare Other | Admitting: Internal Medicine

## 2013-05-06 ENCOUNTER — Encounter: Payer: Self-pay | Admitting: Internal Medicine

## 2013-05-06 VITALS — BP 118/72 | HR 72 | Temp 97.8°F | Wt 130.1 lb

## 2013-05-06 DIAGNOSIS — R42 Dizziness and giddiness: Secondary | ICD-10-CM | POA: Insufficient documentation

## 2013-05-06 DIAGNOSIS — E039 Hypothyroidism, unspecified: Secondary | ICD-10-CM

## 2013-05-06 DIAGNOSIS — I10 Essential (primary) hypertension: Secondary | ICD-10-CM

## 2013-05-06 DIAGNOSIS — I5042 Chronic combined systolic (congestive) and diastolic (congestive) heart failure: Secondary | ICD-10-CM

## 2013-05-06 DIAGNOSIS — I6529 Occlusion and stenosis of unspecified carotid artery: Secondary | ICD-10-CM

## 2013-05-06 DIAGNOSIS — I509 Heart failure, unspecified: Secondary | ICD-10-CM

## 2013-05-06 MED ORDER — PROMETHAZINE-CODEINE 6.25-10 MG/5ML PO SYRP
5.0000 mL | ORAL_SOLUTION | ORAL | Status: DC | PRN
Start: 2013-05-06 — End: 2014-10-31

## 2013-05-06 NOTE — Progress Notes (Signed)
Subjective:    Patient ID: Sydney Moore, female    DOB: 1924/02/04, 80 y.o.   MRN: [id number]  HPI  Patient is here for follow up  Reviewed chronic medical issues and interval medical events  Past Medical History  Diagnosis Date  . Hypertension   . Hyperlipidemia   . Diabetes mellitus   . Chronic cough     Seen by Dr. Sherene SiresWert  . Hypothyroid   . CAD (coronary artery disease) 1989    a. 1989: s/p CABGx3;  b. 06/2007 Cath/Attempted PCI: LM min irregs, LAD 90/100, D1 90 (attempted PCI), RI 90, LCX100p, OM1 100, RCA 60/70p, 90/7984m, VG->RCA nl, VG->OM 100, LIMA->LAD nl, EF 50%.  . Complete heart block     a. s/p PPM (MDT) by Dr Deborah Chalkennant 2004; b. Gen change 09/2010 (MDT ADDRL1 Adapta DC PPM Ser #:[id number]  . Carotid artery occlusion 2011    60-79%right/ 40-59% left  . GERD (gastroesophageal reflux disease)   . Chronic systolic CHF (congestive heart failure)     a. 03/2011 Echo: EF 40% inflat HK, Gr1 DD, mod MR/TR  . Chest pain March 2014    Review of Systems  Constitutional: Negative for fever and fatigue.  Respiratory: Positive for cough (dry - nocturnal, chronic). Negative for shortness of breath.   Cardiovascular: Negative for chest pain and leg swelling.  Neurological: Positive for dizziness (occ). Negative for facial asymmetry, weakness, light-headedness and headaches.       Objective:   Physical Exam  BP 118/72  Pulse 72  Temp(Src) 97.8 F (36.6 C) (Oral)  Wt 130 lb 1.9 oz (59.022 kg)  SpO2 92% Wt Readings from Last 3 Encounters:  05/06/13 130 lb 1.9 oz (59.022 kg)  04/13/13 132 lb 3.2 oz (59.966 kg)  04/11/13 131 lb (59.421 kg)   Constitutional: She appears well-developed and well-nourished. No distress.  Neck: Normal range of motion. Neck supple. No JVD present. No thyromegaly present.  Cardiovascular: Normal rate, regular rhythm and normal heart sounds.  No murmur heard. No BLE edema. Pulmonary/Chest: Effort normal and breath sounds normal. No respiratory  distress. She has no wheezes.  Psychiatric: She has a normal mood and affect. Her behavior is normal. Judgment and thought content normal.   Lab Results  Component Value Date   WBC 5.4 04/11/2013   HGB 11.6* 04/11/2013   HCT 34.8* 04/11/2013   PLT 275.0 04/11/2013   GLUCOSE 83 03/29/2013   CHOL 150 03/29/2013   TRIG 91 03/29/2013   HDL 56 03/29/2013   LDLDIRECT 58 12/18/2008   LDLCALC 76 03/29/2013   ALT 20 03/21/2013   AST 35 03/21/2013   NA 138 03/29/2013   K 5.4* 03/29/2013   CL 98 03/29/2013   CREATININE 1.15* 03/29/2013   BUN 22 03/29/2013   CO2 26 03/29/2013   TSH 3.92 04/11/2013   INR 1.02 04/28/2012   HGBA1C 6.0 10/08/2012   MICROALBUR 1.0 10/08/2012    Nm Myocar Multi W/spect W/wall Motion / Ef  03/29/2013   CLINICAL DATA:  Chest pain  EXAM: Lexiscan Myovue  TECHNIQUE: The patient received IV Lexiscan .4mg  over 15 seconds. 33.0 mCi of Technetium 5921m Sestamibi injected at 30 seconds. Quantitative SPECT images were obtained in the vertical, horizontal and short axis planes after a 45 minute delay. Rest images were obtained with similar planes and delay using 10.2 mCi of Technetium 5321m Sestamibi.  FINDINGS: ECG:  V-paced rhythm  Symptoms:  None reported.  Quantitiative Gated SPECT EF: 35% with peri-apical  severe hypokinesis noted.  Perfusion Images: There was a small, moderate-intensity apical septal and apical anterior perfusion defect noted both at rest and with Lexiscan stress.  IMPRESSION: 1.  EF 35% with peri-apical severe hypokinesis.  2. Fixed small, moderate-intensity apical septal and apical anterior perfusion defect. Given wall motion abnormalities in the same area, concerned for prior infarction. No definite ischemia noted. Intermediate risk study with low EF. Would consider echo to confirm EF.  Dalton Mclean   Electronically Signed   By: Marca Ancona M.D.   On: 03/29/2013 14:30   Dg Chest Portable 1 View  03/28/2013   CLINICAL DATA:  Chest pain.  EXAM: PORTABLE CHEST - 1 VIEW  COMPARISON:   04/28/2012  FINDINGS: Sternotomy wires a right-sided pacemaker are unchanged. Lungs are hypoinflated but otherwise clear. There is borderline cardiomegaly unchanged. There is calcified plaque over the thoracic aorta and mild degenerative change of the spine.  IMPRESSION: Hypoinflation without acute cardiopulmonary disease.   Electronically Signed   By: Elberta Fortis M.D.   On: 03/28/2013 14:47       Assessment & Plan:   Problem List Items Addressed This Visit   Chronic combined systolic and diastolic congestive heart failure - Primary     Euvolemic, no angina Follows with cards for same The current medical regimen is effective;  continue present plan and medications.     HYPERTENSION, BENIGN ESSENTIAL      BP Readings from Last 3 Encounters:  05/06/13 118/72  04/13/13 140/70  04/11/13 140/60  09/2012 reduced ARB dose due to nocturnal symptoms of dizzy because pt reported BP 90s at that time Generator chage PPM 11/2012 but continued symptoms (see above) Current BP is stable - cont same Will continue to monitor BP and call if out of range    HYPOTHYROIDISM      Lab Results  Component Value Date   TSH 3.92 04/11/2013   The current medical regimen is effective;  continue present plan and medications.     Vertigo     Intermittent symptoms - some response to meclizine prn Eval by partner Hopp for same and recent cardiac review unremarkable Neuro exam intact Recent labs unremarkable reassurance provided nd interval hx reviewed      Time spent with pt today 25 minutes, greater than 50% time spent counseling patient on dizziness, HTN, CHF and medication review. Also review of prior records

## 2013-05-06 NOTE — Assessment & Plan Note (Signed)
Euvolemic, no angina Follows with cards for same The current medical regimen is effective;  continue present plan and medications.

## 2013-05-06 NOTE — Assessment & Plan Note (Signed)
Intermittent symptoms - some response to meclizine prn Eval by partner Hopp for same and recent cardiac review unremarkable Neuro exam intact Recent labs unremarkable reassurance provided nd interval hx reviewed

## 2013-05-06 NOTE — Assessment & Plan Note (Signed)
Lab Results  Component Value Date   TSH 3.92 04/11/2013   The current medical regimen is effective;  continue present plan and medications.

## 2013-05-06 NOTE — Assessment & Plan Note (Signed)
BP Readings from Last 3 Encounters:  05/06/13 118/72  04/13/13 140/70  04/11/13 140/60  09/2012 reduced ARB dose due to nocturnal symptoms of dizzy because pt reported BP 90s at that time Generator chage PPM 11/2012 but continued symptoms (see above) Current BP is stable - cont same Will continue to monitor BP and call if out of range

## 2013-05-06 NOTE — Patient Instructions (Signed)
It was good to see you today.  We have reviewed your prior records including labs and tests today  Medications reviewed and updated Ok for cough syrup as requested No other changes recommended  Please schedule followup in 3-4 months, call sooner if problems.

## 2013-05-06 NOTE — Progress Notes (Signed)
Pre visit review using our clinic review tool, if applicable. No additional management support is needed unless otherwise documented below in the visit note. 

## 2013-05-07 ENCOUNTER — Telehealth: Payer: Self-pay | Admitting: Internal Medicine

## 2013-05-07 NOTE — Telephone Encounter (Signed)
Relevant patient education mailed to patient.  

## 2013-05-26 ENCOUNTER — Other Ambulatory Visit: Payer: Self-pay

## 2013-05-26 DIAGNOSIS — Z1231 Encounter for screening mammogram for malignant neoplasm of breast: Secondary | ICD-10-CM

## 2013-06-20 ENCOUNTER — Encounter: Payer: Self-pay | Admitting: Internal Medicine

## 2013-06-20 ENCOUNTER — Ambulatory Visit (INDEPENDENT_AMBULATORY_CARE_PROVIDER_SITE_OTHER): Payer: Medicare Other | Admitting: *Deleted

## 2013-06-20 ENCOUNTER — Telehealth: Payer: Self-pay | Admitting: Cardiology

## 2013-06-20 DIAGNOSIS — I442 Atrioventricular block, complete: Secondary | ICD-10-CM

## 2013-06-20 NOTE — Telephone Encounter (Signed)
Spoke with pt and reminded her of remote transmission for her device that is due today. Pt verbalized understanding.

## 2013-06-20 NOTE — Progress Notes (Signed)
Remote pacemaker transmission.   

## 2013-06-23 LAB — MDC_IDC_ENUM_SESS_TYPE_REMOTE
Battery Remaining Longevity: 109 mo
Brady Statistic AP VP Percent: 92 %
Date Time Interrogation Session: 20150511202357
Lead Channel Impedance Value: 659 Ohm
Lead Channel Pacing Threshold Pulse Width: 0.4 ms
Lead Channel Pacing Threshold Pulse Width: 0.4 ms
Lead Channel Setting Pacing Amplitude: 2 V
Lead Channel Setting Pacing Amplitude: 2.5 V
Lead Channel Setting Sensing Sensitivity: 4 mV
MDC IDC MSMT BATTERY IMPEDANCE: 185 Ohm
MDC IDC MSMT BATTERY VOLTAGE: 2.79 V
MDC IDC MSMT LEADCHNL RA IMPEDANCE VALUE: 473 Ohm
MDC IDC MSMT LEADCHNL RA PACING THRESHOLD AMPLITUDE: 0.875 V
MDC IDC MSMT LEADCHNL RV PACING THRESHOLD AMPLITUDE: 0.5 V
MDC IDC SET LEADCHNL RV PACING PULSEWIDTH: 0.4 ms
MDC IDC STAT BRADY AP VS PERCENT: 0 %
MDC IDC STAT BRADY AS VP PERCENT: 8 %
MDC IDC STAT BRADY AS VS PERCENT: 0 %

## 2013-07-01 ENCOUNTER — Encounter: Payer: Self-pay | Admitting: Internal Medicine

## 2013-07-07 ENCOUNTER — Ambulatory Visit: Payer: Medicare Other

## 2013-07-15 ENCOUNTER — Ambulatory Visit: Payer: Medicare Other | Admitting: Cardiovascular Disease

## 2013-07-22 ENCOUNTER — Encounter: Payer: Self-pay | Admitting: Cardiology

## 2013-07-27 ENCOUNTER — Ambulatory Visit
Admission: RE | Admit: 2013-07-27 | Discharge: 2013-07-27 | Disposition: A | Discharge status: Home / Self Care | Payer: Medicare Other | Source: Ambulatory Visit

## 2013-07-27 ENCOUNTER — Encounter (INDEPENDENT_AMBULATORY_CARE_PROVIDER_SITE_OTHER): Payer: Self-pay

## 2013-07-27 DIAGNOSIS — Z1231 Encounter for screening mammogram for malignant neoplasm of breast: Secondary | ICD-10-CM

## 2013-07-29 ENCOUNTER — Encounter: Payer: Self-pay | Admitting: Family

## 2013-08-01 ENCOUNTER — Encounter: Payer: Self-pay | Admitting: Family

## 2013-08-01 ENCOUNTER — Ambulatory Visit (INDEPENDENT_AMBULATORY_CARE_PROVIDER_SITE_OTHER): Payer: Medicare Other | Admitting: Family

## 2013-08-01 ENCOUNTER — Ambulatory Visit (HOSPITAL_COMMUNITY)
Admission: RE | Admit: 2013-08-01 | Discharge: 2013-08-01 | Disposition: A | Discharge status: Home / Self Care | Payer: Medicare Other | Source: Ambulatory Visit | Attending: Family | Admitting: Family

## 2013-08-01 VITALS — BP 124/68 | HR 60 | Resp 16 | Ht 61.0 in | Wt 135.0 lb

## 2013-08-01 DIAGNOSIS — I6529 Occlusion and stenosis of unspecified carotid artery: Secondary | ICD-10-CM

## 2013-08-01 DIAGNOSIS — Z48812 Encounter for surgical aftercare following surgery on the circulatory system: Secondary | ICD-10-CM

## 2013-08-01 NOTE — Progress Notes (Signed)
Established Carotid Patient   History of Present Illness  Sydney Moore is a 80 y.o. female patient of Dr. Myra Gianotti. The patient is back today for continued surveillance of her carotid artery occlusive disease. She remained asymptomatic. Specifically, she denies numbness or weakness in either extremity. She denies slurred speech. She denies amaurosis fugax.  She has a history of CABG in 1989, had a silent MI, has a pacemaker for complete heart block. She denies claudication symptoms in her legs with walking, denies non healing wounds.  Patient has not had previous carotid artery intervention.   Pt denies New Medical or Surgical History.  Pt Diabetic: Yes, diet controlled Pt smoker: former smoker, quit in 1975  Pt meds include: Statin : Yes ASA: Yes Other anticoagulants/antiplatelets: Plavix   Past Medical History  Diagnosis Date  . Hypertension   . Hyperlipidemia   . Diabetes mellitus   . Chronic cough     Seen by Dr. Sherene Sires  . Hypothyroid   . CAD (coronary artery disease) 1989    a. 1989: s/p CABGx3;  b. 06/2007 Cath/Attempted PCI: LM min irregs, LAD 90/100, D1 90 (attempted PCI), RI 90, LCX100p, OM1 100, RCA 60/70p, 90/79m, VG->RCA nl, VG->OM 100, LIMA->LAD nl, EF 50%.  . Complete heart block     a. s/p PPM (MDT) by Dr Deborah Chalk 2004; b. Gen change 09/2010 (MDT ADDRL1 Adapta DC PPM Ser #:[id number]  . Carotid artery occlusion 2011    60-79%right/ 40-59% left  . GERD (gastroesophageal reflux disease)   . Chronic systolic CHF (congestive heart failure)     a. 03/2011 Echo: EF 40% inflat HK, Gr1 DD, mod MR/TR  . Chest pain March 2014    Social History History  Substance Use Topics  . Smoking status: Former Smoker    Quit date: 12/15/1973  . Smokeless tobacco: Never Used  . Alcohol Use: No    Family History Family History  Problem Relation Age of Onset  . Hypertension Mother   . Liver cancer Brother   . Breast cancer Daughter   . Colon cancer Brother   . Cancer  Brother     colon    Surgical History Past Surgical History  Procedure Laterality Date  . Coronary artery bypass graft  1989  . Pacemaker placement  2004    MDT by Dr Deborah Chalk  . Cataract extraction  2004    Both  . Cardiac catheterization  2004, 2007, 2009  . Eye surgery      No Known Allergies  Current Outpatient Prescriptions  Medication Sig Dispense Refill  . amLODipine (NORVASC) 5 MG tablet Take 1 tablet (5 mg total) by mouth daily.  90 tablet  3  . aspirin 81 MG tablet Take 81 mg by mouth every morning.        Marland Kitchen atorvastatin (LIPITOR) 20 MG tablet Take 1 tablet (20 mg total) by mouth daily.  90 tablet  3  . calcium-vitamin D (OSCAL WITH D) 500-200 MG-UNIT per tablet Take 1 tablet by mouth daily.        . clobetasol cream (TEMOVATE) 0.05 % Apply 1 application topically as needed. Apply to affected on skin twice a day as needed for rash  30 g  0  . clopidogrel (PLAVIX) 75 MG tablet Take 1 tablet (75 mg total) by mouth daily.  90 tablet  3  . esomeprazole (NEXIUM) 40 MG capsule Take 40 mg by mouth daily before breakfast.      . furosemide (LASIX) 40 MG  tablet take 1 tablet by mouth once daily  90 tablet  3  . isosorbide mononitrate (IMDUR) 120 MG 24 hr tablet Take 1 tablet (120 mg total) by mouth daily.  30 tablet  5  . levothyroxine (SYNTHROID, LEVOTHROID) 50 MCG tablet Take 50 mcg by mouth daily.        Marland Kitchen. losartan (COZAAR) 50 MG tablet Take 1 tablet (50 mg total) by mouth daily.  90 tablet  3  . meclizine (ANTIVERT) 12.5 MG tablet Take 1 tablet (12.5 mg total) by mouth 3 (three) times daily as needed for dizziness.  30 tablet  0  . metoprolol succinate (TOPROL-XL) 50 MG 24 hr tablet Take 50 mg by mouth daily. Take with or immediately following a meal.      . metroNIDAZOLE (METROGEL) 1 % gel Apply 1 application topically daily as needed (for vaginal).      . nitroGLYCERIN (NITROLINGUAL) 0.4 MG/SPRAY spray Place 1 spray under the tongue every 5 (five) minutes x 3 doses as needed  for chest pain.      . potassium chloride SA (K-DUR,KLOR-CON) 20 MEQ tablet Take 1 tablet (20 mEq total) by mouth every evening.  90 tablet  3  . promethazine-codeine (PHENERGAN WITH CODEINE) 6.25-10 MG/5ML syrup Take 5 mLs by mouth every 4 (four) hours as needed for cough.  180 mL  0  . ranolazine (RANEXA) 1000 MG SR tablet Take 1 tablet (1,000 mg total) by mouth 2 (two) times daily.  180 tablet  3   No current facility-administered medications for this visit.    Review of Systems : See HPI for pertinent positives and negatives.  Physical Examination  Filed Vitals:   08/01/13 1225  BP: 124/68  Pulse: 60  Resp: 16   Filed Vitals:   08/01/13 1222 08/01/13 1225  BP: 127/65 124/68  Pulse: 61 60  Resp:  16  Height:  5\' 1"  (1.549 m)  Weight:  135 lb (61.236 kg)  SpO2:  97%   Body mass index is 25.52 kg/(m^2).  General: WDWN female in NAD GAIT: normal Eyes: PERRLA Pulmonary:  Non-labored, CTAB, Negative  Rales, Negative rhonchi, & Negative wheezing.  Cardiac: regular Rhythm ,  Negative detected murmur.  VASCULAR EXAM Carotid Bruits Left Right   Positive Negative   Radial pulses are 2+ palpable and equal.                                                                                                                            LE Pulses LEFT RIGHT       POPLITEAL  not palpable   not palpable       POSTERIOR TIBIAL  not palpable   not palpable        DORSALIS PEDIS      ANTERIOR TIBIAL  palpable   palpable     Gastrointestinal: soft, nontender, BS WNL, no r/g,  negative masses.  Musculoskeletal: Negative muscle atrophy/wasting. M/S 5/5 throughout, Extremities  without ischemic changes.  Neurologic: A&O X 3; Appropriate Affect ; SENSATION ;normal;  Speech is normal CN 2-12 intact, Pain and light touch intact in extremities, Motor exam as listed above.   Non-Invasive Vascular Imaging CAROTID DUPLEX 08/01/2013   CEREBROVASCULAR DUPLEX EVALUATION    INDICATION:  Carotid disease    PREVIOUS INTERVENTION(S):     DUPLEX EXAM:     RIGHT  LEFT  Peak Systolic Velocities (cm/s) End Diastolic Velocities (cm/s) Plaque LOCATION Peak Systolic Velocities (cm/s) End Diastolic Velocities (cm/s) Plaque  53 5  CCA PROXIMAL 49 7   58 7  CCA MID 42 8   32 8 CP CCA DISTAL 38 8 CP  110 0 HT ECA 357 0 CP  242 49 CP ICA PROXIMAL 136 20 CP  80 13  ICA MID 37 9   36 9  ICA DISTAL 34 9     7.6 ICA / CCA Ratio (PSV) 3.6  Antegrade Vertebral Flow Antegrade  130 Brachial Systolic Pressure (mmHg) 120  Multiphasic (subclavian artery) Brachial Artery Waveforms Multiphasic (subclavian artery)    Plaque Morphology:  HM = Homogeneous, HT = Heterogeneous, CP = Calcific Plaque, SP = Smooth Plaque, IP = Irregular Plaque     ADDITIONAL FINDINGS:   No significant stenosis of the right external or bilateral common carotid arteries.   Left external carotid artery stenosis noted.    IMPRESSION: Doppler velocities suggest a high end 40-59% stenosis of the right proximal internal carotid artery and a less than 40% stenosis of the left proximal internal carotid artery however velocities may be underestimated due to acoustic plaque shadowing.    Compared to the previous exam:  No significant change in the maximum velocities of the bilateral internal carotid arteries when compared to the previous exam on 07/26/12.      Assessment: Sydney Moore is a 80 y.o. female who presents with asymptomatic high end 40-59% stenosis of the right proximal internal carotid artery and a less than 40% stenosis of the left proximal internal carotid artery however velocities may be underestimated due to acoustic plaque shadowing. No significant change in the maximum velocities of the bilateral internal carotid arteries when compared to the previous exam on 07/26/12.   Plan: Follow-up in 1 year with Carotid Duplex scan.   I discussed in depth with the patient the nature of atherosclerosis, and  emphasized the importance of maximal medical management including strict control of blood pressure, blood glucose, and lipid levels, obtaining regular exercise, and continued cessation of smoking.  The patient is aware that without maximal medical management the underlying atherosclerotic disease process will progress, limiting the benefit of any interventions. The patient was given information about stroke prevention and what symptoms should prompt the patient to seek immediate medical care. Thank you for allowing [place] to participate in this patient's care.  Charisse March, RN, MSN, FNP-C [facility] Office: [phone]  Clinic Physician: Myra Gianotti  08/01/2013 12:57 PM

## 2013-08-01 NOTE — Patient Instructions (Signed)
Stroke Prevention Some medical conditions and behaviors are associated with an increased chance of having a stroke. You may prevent a stroke by making healthy choices and managing medical conditions. HOW CAN I REDUCE MY RISK OF HAVING A STROKE?   Stay physically active. Get at least 30 minutes of activity on most or all days.  Do not smoke. It may also be helpful to avoid exposure to secondhand smoke.  Limit alcohol use. Moderate alcohol use is considered to be:  No more than 2 drinks per day for men.  No more than 1 drink per day for nonpregnant women.  Eat healthy foods. This involves  Eating 5 or more servings of fruits and vegetables a day.  Following a diet that addresses high blood pressure (hypertension), high cholesterol, diabetes, or obesity.  Manage your cholesterol levels.  A diet low in saturated fat, trans fat, and cholesterol and high in fiber may control cholesterol levels.  Take any prescribed medicines to control cholesterol as directed by your health care provider.  Manage your diabetes.  A controlled-carbohydrate, controlled-sugar diet is recommended to manage diabetes.  Take any prescribed medicines to control diabetes as directed by your health care provider.  Control your hypertension.  A low-salt (sodium), low-saturated fat, low-trans fat, and low-cholesterol diet is recommended to manage hypertension.  Take any prescribed medicines to control hypertension as directed by your health care provider.  Maintain a healthy weight.  A reduced-calorie, low-sodium, low-saturated fat, low-trans fat, low-cholesterol diet is recommended to manage weight.  Stop drug abuse.  Avoid taking birth control pills.  Talk to your health care provider about the risks of taking birth control pills if you are over 35 years old, smoke, get migraines, or have ever had a blood clot.  Get evaluated for sleep disorders (sleep apnea).  Talk to your health care provider about  getting a sleep evaluation if you snore a lot or have excessive sleepiness.  Take medicines as directed by your health care provider.  For some people, aspirin or blood thinners (anticoagulants) are helpful in reducing the risk of forming abnormal blood clots that can lead to stroke. If you have the irregular heart rhythm of atrial fibrillation, you should be on a blood thinner unless there is a good reason you cannot take them.  Understand all your medicine instructions.  Make sure that other other conditions (such as anemia or atherosclerosis) are addressed. SEEK IMMEDIATE MEDICAL CARE IF:   You have sudden weakness or numbness of the face, arm, or leg, especially on one side of the body.  Your face or eyelid droops to one side.  You have sudden confusion.  You have trouble speaking (aphasia) or understanding.  You have sudden trouble seeing in one or both eyes.  You have sudden trouble walking.  You have dizziness.  You have a loss of balance or coordination.  You have a sudden, severe headache with no known cause.  You have new chest pain or an irregular heartbeat. Any of these symptoms may represent a serious problem that is an emergency. Do not wait to see if the symptoms will go away. Get medical help at once. Call your local emergency services  (911 in [place] Do not drive yourself to the hospital. Document Released: 03/06/2004 Document Revised: 11/17/2012 Document Reviewed: 07/30/2012 ExitCare Patient Information 2015 [employer]. This information is not intended to replace advice given to you by your health care provider. Make sure you discuss any questions you have with your health   care provider.  

## 2013-08-05 ENCOUNTER — Telehealth: Payer: Self-pay | Admitting: Internal Medicine

## 2013-08-05 NOTE — Telephone Encounter (Signed)
Pt request new Rx for Synthroid 50 mg and Aspirine 81 mg to be send into [facility]. Please advise.

## 2013-08-08 MED ORDER — LEVOTHYROXINE SODIUM 50 MCG PO TABS: 50.0000 ug | ORAL_TABLET | Freq: Every day | ORAL | Status: DC

## 2013-08-08 MED ORDER — ASPIRIN 81 MG PO TABS: 81.0000 mg | ORAL_TABLET | ORAL | Status: AC

## 2013-08-08 NOTE — Telephone Encounter (Signed)
Notified pt rx's has been sent to [facility]...Sydney Moore

## 2013-08-09 ENCOUNTER — Encounter: Payer: Self-pay | Admitting: Cardiovascular Disease

## 2013-08-09 ENCOUNTER — Ambulatory Visit (INDEPENDENT_AMBULATORY_CARE_PROVIDER_SITE_OTHER): Payer: Medicare Other | Admitting: Cardiovascular Disease

## 2013-08-09 VITALS — BP 120/58 | HR 82 | Ht 61.0 in | Wt 135.6 lb

## 2013-08-09 DIAGNOSIS — I6529 Occlusion and stenosis of unspecified carotid artery: Secondary | ICD-10-CM

## 2013-08-09 DIAGNOSIS — I509 Heart failure, unspecified: Secondary | ICD-10-CM

## 2013-08-09 DIAGNOSIS — I5042 Chronic combined systolic (congestive) and diastolic (congestive) heart failure: Secondary | ICD-10-CM

## 2013-08-09 NOTE — Assessment & Plan Note (Signed)
She is doing well.   No significant angina.   Continue current meds.

## 2013-08-09 NOTE — Assessment & Plan Note (Signed)
She is not having any significant dyspnea.

## 2013-08-09 NOTE — Patient Instructions (Signed)
Your physician recommends that you continue on your current medications as directed. Please refer to the Current Medication list given to you today.  Your physician wants you to follow-up in: 6 months with Dr. Nahser.  You will receive a reminder letter in the mail two months in advance. If you don't receive a letter, please call our office to schedule the follow-up appointment.  

## 2013-08-09 NOTE — Progress Notes (Signed)
Sydney Moore Date of Birth  1923/08/18 [facility] [address]     [address] [place], [place]  [address] [phone]  Fax  [phone]   Problems: 1. CAD - s/p CABG 2. Pacer - for advanced AV block 3. PVD- left carotid bruit 4. CHF - EF of 40% 5. Diabetes Mellitus 6. Hypothyroidism 7. Bilateral carotid bruits:    History of Present Illness:  80 year old female with a history of coronary artery disease and pacemaker implant.  She has a history of peripheral vascular disease and has a known left carotid bruit which we are following.   She's had a history of coronary artery bypass grafting. She denies any shortness of breath or chest pain. She does complain of some leg weakness particularly with walking.  Check pacer generator changed in October. She has developed some keloid formation over the pacer incision  A recent echocardiogram reveals mild to moderate left ventricular dysfunction with an ejection fraction of 40%.  There is moderate mitral regurgitation and moderate tricuspid regurgitation. Right ventricle appears to be dilated. Her pulmonary pressures are at the upper limits of normal at 36 mmHg. Her Lasix dose was increased by Dr. Ladona Ridgel and she's feeling a bit better.  Septeber 17, 2013 - she has done well.  She has occasional episodes of CP when walking - goes away very quickly with a SL NTG.  These episodes of chest pain are very rare. She's able to do most of her normal activities without any significant problems. She has known stenosis in her ramus intermediate branch that was not amenable to PCI. We were not able to put a wire down that vessel. At that point she was started on Ranexa and has done well since that time.  June 27 ,2014: Sydney Moore is doing well.  She has occasional CP and takes NTG occasionally.  Feb. 9, 2015: Sydney Moore is doing ok.  She still has some exertional angina relieved with SL NTG.   She is able to do all  of her normal daily activities without significant limitations as long as she takes the nitroglycerin.   She is on Ranexa 1000 BID.    Her BP is high this am - she has not taken her BP pills yet.  April 13, 2013:  Sydney Mccreedy has been admitted to the hospital since I last saw her. She had a stress Myoview study which revealed a fixed apical defect and no ischemia.  Ejection fraction remained moderately reduced at 35%.  The isosorbide was increased to 120 mg a day.  She continues to have some mild chest pain but thinks that the episodes are decreased from before the hospitalization. She does use sublingual nitroglycerin on a daily basis that several times a day infact.   August 09, 2013:  She is doing well.  No significant angina.     Current Outpatient Prescriptions on File Prior to Visit  Medication Sig Dispense Refill  . amLODipine (NORVASC) 5 MG tablet Take 1 tablet (5 mg total) by mouth daily.  90 tablet  3  . aspirin 81 MG tablet Take 1 tablet (81 mg total) by mouth every morning.  90 tablet  1  . atorvastatin (LIPITOR) 20 MG tablet Take 1 tablet (20 mg total) by mouth daily.  90 tablet  3  . calcium-vitamin D (OSCAL WITH D) 500-200 MG-UNIT per tablet Take 1 tablet by mouth daily.        . clobetasol cream (  TEMOVATE) 0.05 % Apply 1 application topically as needed. Apply to affected on skin twice a day as needed for rash  30 g  0  . clopidogrel (PLAVIX) 75 MG tablet Take 1 tablet (75 mg total) by mouth daily.  90 tablet  3  . esomeprazole (NEXIUM) 40 MG capsule Take 40 mg by mouth daily before breakfast.      . furosemide (LASIX) 40 MG tablet take 1 tablet by mouth once daily  90 tablet  3  . isosorbide mononitrate (IMDUR) 120 MG 24 hr tablet Take 1 tablet (120 mg total) by mouth daily.  30 tablet  5  . levothyroxine (SYNTHROID, LEVOTHROID) 50 MCG tablet Take 1 tablet (50 mcg total) by mouth daily.  90 tablet  1  . losartan (COZAAR) 50 MG tablet Take 1 tablet (50 mg total) by mouth daily.  90  tablet  3  . meclizine (ANTIVERT) 12.5 MG tablet Take 1 tablet (12.5 mg total) by mouth 3 (three) times daily as needed for dizziness.  30 tablet  0  . metoprolol succinate (TOPROL-XL) 50 MG 24 hr tablet Take 50 mg by mouth daily. Take with or immediately following a meal.      . metroNIDAZOLE (METROGEL) 1 % gel Apply 1 application topically daily as needed (for vaginal).      . nitroGLYCERIN (NITROLINGUAL) 0.4 MG/SPRAY spray Place 1 spray under the tongue every 5 (five) minutes x 3 doses as needed for chest pain.      . potassium chloride SA (K-DUR,KLOR-CON) 20 MEQ tablet Take 1 tablet (20 mEq total) by mouth every evening.  90 tablet  3  . promethazine-codeine (PHENERGAN WITH CODEINE) 6.25-10 MG/5ML syrup Take 5 mLs by mouth every 4 (four) hours as needed for cough.  180 mL  0  . ranolazine (RANEXA) 1000 MG SR tablet Take 1 tablet (1,000 mg total) by mouth 2 (two) times daily.  180 tablet  3   No current facility-administered medications on file prior to visit.    No Known Allergies  Past Medical History  Diagnosis Date  . Hypertension   . Hyperlipidemia   . Diabetes mellitus   . Chronic cough     Seen by Dr. Sherene SiresWert  . Hypothyroid   . CAD (coronary artery disease) 1989    a. 1989: s/p CABGx3;  b. 06/2007 Cath/Attempted PCI: LM min irregs, LAD 90/100, D1 90 (attempted PCI), RI 90, LCX100p, OM1 100, RCA 60/70p, 90/4835m, VG->RCA nl, VG->OM 100, LIMA->LAD nl, EF 50%.  . Complete heart block     a. s/p PPM (MDT) by Dr Deborah Chalkennant 2004; b. Gen change 09/2010 (MDT ADDRL1 Adapta DC PPM Ser #:[id number]  . Carotid artery occlusion 2011    60-79%right/ 40-59% left  . GERD (gastroesophageal reflux disease)   . Chronic systolic CHF (congestive heart failure)     a. 03/2011 Echo: EF 40% inflat HK, Gr1 DD, mod MR/TR  . Chest pain March 2014    Past Surgical History  Procedure Laterality Date  . Coronary artery bypass graft  1989  . Pacemaker placement  2004    MDT by Dr Deborah Chalkennant  . Cataract  extraction  2004    Both  . Cardiac catheterization  2004, 2007, 2009  . Eye surgery      History  Smoking status  . Former Smoker  . Quit date: 12/15/1973  Smokeless tobacco  . Never Used    History  Alcohol Use No    Family History  Problem  Relation Age of Onset  . Hypertension Mother   . Liver cancer Brother   . Breast cancer Daughter   . Colon cancer Brother   . Cancer Brother     colon    Reviw of Systems:  Reviewed in the HPI.  All other systems are negative.  Physical Exam: BP 120/58  Pulse 82  Ht 5\' 1"  (1.549 m)  Wt 135 lb 9.6 oz (61.508 kg)  BMI 25.63 kg/m2 The patient is alert and oriented x 3.  The mood and affect are normal.   Skin: warm and dry.  Color is normal.    HEENT:   the sclera are nonicteric.  The mucous membranes are moist.  The carotids are 2+ with soft bruits. .  There is no thyromegaly.  Pulsatile next is inferior to her carotid arteries that are either do to a large V wave or perhaps do to her common carotid artery.  Lungs: clear.  The chest wall is non tender.    Heart: regular rate with a normal S1 and S2.  There is a soft systolic murmur The PMI is not displaced.  Her pacer is in the right subclavian and has a small amount of keloid.   Abdomen: good bowel sounds.  There is no guarding or rebound.  There is no hepatosplenomegaly or tenderness.  There are no masses.   Extremities:  no clubbing, cyanosis, or edema.  The legs are without rashes.  The distal pulses are diminished  Neuro:  Cranial nerves II - XII are intact.  Motor and sensory functions are intact.    The gait is normal.  ECG:     Assessment / Plan:

## 2013-09-22 ENCOUNTER — Telehealth: Payer: Self-pay | Admitting: Cardiology

## 2013-09-22 ENCOUNTER — Encounter: Payer: Medicare Other | Admitting: *Deleted

## 2013-09-22 NOTE — Telephone Encounter (Signed)
LMOVM reminding pt to send remote transmission.   

## 2013-09-23 ENCOUNTER — Encounter: Payer: Self-pay | Admitting: Cardiology

## 2013-09-29 ENCOUNTER — Telehealth: Payer: Self-pay | Admitting: Internal Medicine

## 2013-09-29 DIAGNOSIS — L608 Other nail disorders: Secondary | ICD-10-CM

## 2013-09-29 NOTE — Telephone Encounter (Signed)
Pt called request referral to dermatologist, pt stated she just notice white spot on her finger nails. Please advise.

## 2013-09-30 NOTE — Telephone Encounter (Signed)
Order done - thanks

## 2013-10-03 ENCOUNTER — Telehealth: Payer: Self-pay | Admitting: Internal Medicine

## 2013-10-03 NOTE — Telephone Encounter (Signed)
Patient Information:  Caller Name: Shaniesha  Phone: [phone]  Patient: Sydney Moore  Gender: Female  DOB: Sep 11, 1923  Age: 80 Years  PCP: Rene Paci (Adults only)  Office Follow Up:  Does the office need to follow up with this patient?: Yes  Instructions For The Office: Calling about white on some fingernails and two fingernails breaking and splitting. Has sli pain where fingernails have broken but no signs of infection and still able to use fingers well. Called last week to request a dermatology referral regarding fingernails and no one has called her back. PLEASE ADVISE.  RN Note:  Will send a message to the office to follow-up with pt. Home care advice given. Pt verbalized understanding.  Symptoms  Reason For Call & Symptoms: Calling about white on some fingernails and two fingernails breaking and splitting. Has sli pain where fingernails have broken but no signs of infection and still able to use fingers well. Called last week to request a dermatology referral regarding fingernails and no one has called her back.  Reviewed Health History In EMR: Yes  Reviewed Medications In EMR: Yes  Reviewed Allergies In EMR: Yes  Reviewed Surgeries / Procedures: Yes  Date of Onset of Symptoms: 09/05/2013  Guideline(s) Used:  Finger Pain  Disposition Per Guideline:   See Within 2 Weeks in Office  Reason For Disposition Reached:   Mild pain (e.g., does not interfere with normal activities) and present > 7 days  Advice Given:  Pain Medicines:  For pain relief, you can take either acetaminophen, ibuprofen, or naproxen.  Call Back If:  Fever occurs  Redness or swelling appears  You become worse.  Patient Will Follow Care Advice:  YES

## 2013-10-04 NOTE — Telephone Encounter (Signed)
Pt has been notified of her app on 10/12/13

## 2013-10-04 NOTE — Telephone Encounter (Signed)
Please give pt her info re: appt with Dr Margo Aye -derm (check with [facility] if needed)

## 2013-10-11 ENCOUNTER — Other Ambulatory Visit: Payer: Self-pay | Admitting: Internal Medicine

## 2013-10-13 ENCOUNTER — Other Ambulatory Visit (HOSPITAL_COMMUNITY): Payer: Self-pay | Admitting: Internal Medicine

## 2013-11-28 ENCOUNTER — Encounter: Payer: Self-pay | Admitting: Internal Medicine

## 2013-11-28 ENCOUNTER — Ambulatory Visit (INDEPENDENT_AMBULATORY_CARE_PROVIDER_SITE_OTHER): Payer: Medicare Other | Admitting: Internal Medicine

## 2013-11-28 VITALS — BP 142/68 | HR 95 | Ht 62.0 in | Wt 136.2 lb

## 2013-11-28 DIAGNOSIS — I6529 Occlusion and stenosis of unspecified carotid artery: Secondary | ICD-10-CM

## 2013-11-28 DIAGNOSIS — I119 Hypertensive heart disease without heart failure: Secondary | ICD-10-CM

## 2013-11-28 DIAGNOSIS — I209 Angina pectoris, unspecified: Secondary | ICD-10-CM

## 2013-11-28 DIAGNOSIS — I442 Atrioventricular block, complete: Secondary | ICD-10-CM

## 2013-11-28 DIAGNOSIS — I25708 Atherosclerosis of coronary artery bypass graft(s), unspecified, with other forms of angina pectoris: Secondary | ICD-10-CM

## 2013-11-28 DIAGNOSIS — Z23 Encounter for immunization: Secondary | ICD-10-CM

## 2013-11-28 LAB — MDC_IDC_ENUM_SESS_TYPE_INCLINIC
Battery Impedance: 185 Ohm
Brady Statistic AP VS Percent: 0 %
Brady Statistic AS VP Percent: 6 %
Brady Statistic AS VS Percent: 0 %
Date Time Interrogation Session: 20151019150304
Lead Channel Impedance Value: 459 Ohm
Lead Channel Impedance Value: 639 Ohm
Lead Channel Pacing Threshold Amplitude: 0.5 V
Lead Channel Pacing Threshold Amplitude: 0.75 V
Lead Channel Sensing Intrinsic Amplitude: 2 mV
Lead Channel Setting Pacing Amplitude: 2 V
Lead Channel Setting Pacing Amplitude: 2.5 V
MDC IDC MSMT BATTERY REMAINING LONGEVITY: 108 mo
MDC IDC MSMT BATTERY VOLTAGE: 2.79 V
MDC IDC MSMT LEADCHNL RA PACING THRESHOLD PULSEWIDTH: 0.4 ms
MDC IDC MSMT LEADCHNL RV PACING THRESHOLD PULSEWIDTH: 0.4 ms
MDC IDC SET LEADCHNL RV PACING PULSEWIDTH: 0.4 ms
MDC IDC SET LEADCHNL RV SENSING SENSITIVITY: 4 mV
MDC IDC STAT BRADY AP VP PERCENT: 94 %

## 2013-11-28 MED ORDER — METOPROLOL SUCCINATE ER 100 MG PO TB24: 100.0000 mg | ORAL_TABLET | Freq: Every day | ORAL | Status: DC

## 2013-11-28 NOTE — Progress Notes (Signed)
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 female who presents today for routine electrophysiology followup.  Since last being seen in our clinic, the patient reports doing reasonably well.  She has stable canadian class II angina for which she is followed closely by Dr Elease Hashimoto. She has exertional CP for which she takes nitroglycerine most days.  Her pain is easily resolved with a single slntg.  Today, she denies symptoms of palpitations, shortness of breath, or lower extremity edema.  The patient is otherwise without complaint today.   Past Medical History  Diagnosis Date  . Hypertension   . Hyperlipidemia   . Diabetes mellitus   . Chronic cough     Seen by Dr. Sherene Sires  . Hypothyroid   . CAD (coronary artery disease) 1989    a. 1989: s/p CABGx3;  b. 06/2007 Cath/Attempted PCI: LM min irregs, LAD 90/100, D1 90 (attempted PCI), RI 90, LCX100p, OM1 100, RCA 60/70p, 90/73m, VG->RCA nl, VG->OM 100, LIMA->LAD nl, EF 50%.  . Complete heart block     a. s/p PPM (MDT) by Dr Deborah Chalk 2004; b. Gen change 09/2010 (MDT ADDRL1 Adapta DC PPM Ser #:[id number]  . Carotid artery occlusion 2011    60-79%right/ 40-59% left  . GERD (gastroesophageal reflux disease)   . Chronic systolic CHF (congestive heart failure)     a. 03/2011 Echo: EF 40% inflat HK, Gr1 DD, mod MR/TR  . Chest pain March 2014   Past Surgical History  Procedure Laterality Date  . Coronary artery bypass graft  1989  . Pacemaker placement  2004    MDT by Dr Deborah Chalk  . Cataract extraction  2004    Both  . Cardiac catheterization  2004, 2007, 2009  . Eye surgery      Current Outpatient Prescriptions  Medication Sig Dispense Refill  . amLODipine (NORVASC) 5 MG tablet Take 1 tablet (5 mg total) by mouth daily.  90 tablet  3  . aspirin 81 MG tablet Take 1 tablet (81 mg total) by mouth every morning.  90 tablet  1  . atorvastatin (LIPITOR) 20 MG tablet Take 1 tablet (20 mg total) by mouth daily.  90  tablet  3  . calcium-vitamin D (OSCAL WITH D) 500-200 MG-UNIT per tablet Take 1 tablet by mouth daily.        . clobetasol cream (TEMOVATE) 0.05 % Apply 1 application topically as needed. Apply to affected on skin twice a day as needed for rash  30 g  0  . clopidogrel (PLAVIX) 75 MG tablet Take 1 tablet (75 mg total) by mouth daily.  90 tablet  3  . esomeprazole (NEXIUM) 40 MG capsule Take 40 mg by mouth daily before breakfast.      . furosemide (LASIX) 40 MG tablet take 1 tablet by mouth once daily  90 tablet  3  . isosorbide mononitrate (IMDUR) 120 MG 24 hr tablet take 1 tablet by mouth once daily  30 tablet  5  . levothyroxine (SYNTHROID, LEVOTHROID) 50 MCG tablet Take 1 tablet (50 mcg total) by mouth daily.  90 tablet  1  . losartan (COZAAR) 50 MG tablet Take 1 tablet (50 mg total) by mouth daily.  90 tablet  3  . meclizine (ANTIVERT) 12.5 MG tablet take 1 tablet by mouth three times a day if needed for dizziness  30 tablet  0  . metoprolol succinate (TOPROL-XL) 50 MG 24 hr tablet Take 50 mg  by mouth daily. Take with or immediately following a meal.      . metroNIDAZOLE (METROGEL) 1 % gel Apply 1 application topically daily as needed (for vaginal).      . nitroGLYCERIN (NITROLINGUAL) 0.4 MG/SPRAY spray Place 1 spray under the tongue every 5 (five) minutes x 3 doses as needed for chest pain.      . potassium chloride SA (K-DUR,KLOR-CON) 20 MEQ tablet Take 1 tablet (20 mEq total) by mouth every evening.  90 tablet  3  . promethazine-codeine (PHENERGAN WITH CODEINE) 6.25-10 MG/5ML syrup Take 5 mLs by mouth every 4 (four) hours as needed for cough.  180 mL  0  . ranolazine (RANEXA) 1000 MG SR tablet Take 1 tablet (1,000 mg total) by mouth 2 (two) times daily.  180 tablet  3   No current facility-administered medications for this visit.    Physical Exam: Filed Vitals:   11/28/13 1447  BP: 142/68  Pulse: 95  Height: 5\' 2"  (1.575 m)  Weight: 136 lb 3.2 oz (61.78 kg)    GEN- The patient is  well appearing, alert and oriented x 3 today.   Head- normocephalic, atraumatic Eyes-  Sclera clear, conjunctiva pink Ears- hearing intact Oropharynx- clear Lungs- Clear to ausculation bilaterally, normal work of breathing Chest- pacemaker pocket is well healed Heart- Regular rate and rhythm, no murmurs, rubs or gallops, PMI not laterally displaced GI- soft, NT, ND, + BS Extremities- no clubbing, cyanosis, or edema  Pacemaker interrogation- reviewed in detail today,  See PACEART report  Assessment and Plan:  Atrioventricular block, complete  Normal pacemaker function See Pace Art report No changes today  HYPERTENSIVE Cardiovascular disease At goal today Increase toprol  ATHEROSCLEROSIS, CORONARY, BYPASS GRAFT NOS   She has chronic difficulty with angina Increase toprol to 100mg  daily.  Given her advanced age, I would favor medical therapy over procedures long term for her angina.  carelink every 3 months Return to see me n 1 year in the device clinic Follow-up with Dr Elease HashimotoNahser as scheduled

## 2013-11-28 NOTE — Patient Instructions (Addendum)
Remote monitoring is used to monitor your pacemaker from home. This monitoring reduces the number of office visits required to check your device to one time per year. It allows [place] to keep an eye on the functioning of your device to ensure it is working properly. You are scheduled for a device check from home on 03/01/2014. You may send your transmission at any time that day. If you have a wireless device, the transmission will be sent automatically. After your physician reviews your transmission, you will receive a postcard with your next transmission date.  Your physician recommends that you schedule a follow-up appointment in: 12 months with Dr.Allred  Your physician has recommended you make the following change in your medication:  1) Increase Toprol to 100mg  daily

## 2014-01-10 ENCOUNTER — Encounter: Payer: Self-pay | Admitting: Cardiovascular Disease

## 2014-01-10 ENCOUNTER — Ambulatory Visit (INDEPENDENT_AMBULATORY_CARE_PROVIDER_SITE_OTHER): Payer: Medicare Other | Admitting: Cardiovascular Disease

## 2014-01-10 VITALS — BP 120/62 | HR 80 | Ht 61.0 in | Wt 133.6 lb

## 2014-01-10 DIAGNOSIS — I25810 Atherosclerosis of coronary artery bypass graft(s) without angina pectoris: Secondary | ICD-10-CM

## 2014-01-10 DIAGNOSIS — I6529 Occlusion and stenosis of unspecified carotid artery: Secondary | ICD-10-CM

## 2014-01-10 LAB — HEPATIC FUNCTION PANEL
ALT: 18 U/L (ref 0–35)
AST: 33 U/L (ref 0–37)
Albumin: 4.1 g/dL (ref 3.5–5.2)
Alkaline Phosphatase: 61 U/L (ref 39–117)
BILIRUBIN DIRECT: 0 mg/dL (ref 0.0–0.3)
BILIRUBIN TOTAL: 0.7 mg/dL (ref 0.2–1.2)
Total Protein: 7.2 g/dL (ref 6.0–8.3)

## 2014-01-10 LAB — BASIC METABOLIC PANEL
BUN: 15 mg/dL (ref 6–23)
CO2: 24 meq/L (ref 19–32)
CREATININE: 1.1 mg/dL (ref 0.4–1.2)
Calcium: 9 mg/dL (ref 8.4–10.5)
Chloride: 103 mEq/L (ref 96–112)
GFR: 58.41 mL/min — ABNORMAL LOW (ref >60.00)
GLUCOSE: 126 mg/dL — AB (ref 70–99)
POTASSIUM: 4.1 meq/L (ref 3.5–5.1)
Sodium: 138 mEq/L (ref 135–145)

## 2014-01-10 LAB — LIPID PANEL
CHOL/HDL RATIO: 3
CHOLESTEROL: 136 mg/dL (ref 0–200)
HDL: 40.5 mg/dL (ref >39.00)
LDL CALC: 74 mg/dL (ref 0–99)
NonHDL: 95.5
Triglycerides: 107 mg/dL (ref 0.0–149.0)
VLDL: 21.4 mg/dL (ref 0.0–40.0)

## 2014-01-10 NOTE — Progress Notes (Signed)
Sydney Moore Date of Birth  [birth year] [facility] / [facility] [address]     [address] [place], [place]  [address] [phone]  Fax  [phone]   Problems: 1. CAD - s/p CABG 2. Pacer - for advanced AV block 3. PVD- left carotid bruit 4. CHF - EF of 40% 5. Diabetes Mellitus 6. Hypothyroidism 7. Bilateral carotid bruits:    History of Present Illness:  80 year old female with a history of coronary artery disease and pacemaker implant.  She has a history of peripheral vascular disease and has a known left carotid bruit which we are following.   She's had a history of coronary artery bypass grafting. She denies any shortness of breath or chest pain. She does complain of some leg weakness particularly with walking.  Check pacer generator changed in October. She has developed some keloid formation over the pacer incision  A recent echocardiogram reveals mild to moderate left ventricular dysfunction with an ejection fraction of 40%.  There is moderate mitral regurgitation and moderate tricuspid regurgitation. Right ventricle appears to be dilated. Her pulmonary pressures are at the upper limits of normal at 36 mmHg. Her Lasix dose was increased by Dr. Ladona Ridgel and she's feeling a bit better.  Septeber 17, 2013 - she has done well.  She has occasional episodes of CP when walking - goes away very quickly with a SL NTG.  These episodes of chest pain are very rare. She's able to do most of her normal activities without any significant problems. She has known stenosis in her ramus intermediate branch that was not amenable to PCI. We were not able to put a wire down that vessel. At that point she was started on Ranexa and has done well since that time.  June 27 ,2014: Sydney Moore is doing well.  She has occasional CP and takes NTG occasionally.  Feb. 9, 2015: Sydney Moore is doing ok.  She still has some exertional angina relieved with SL NTG.   She is able to do all  of her normal daily activities without significant limitations as long as she takes the nitroglycerin.   She is on Ranexa 1000 BID.    Her BP is high this am - she has not taken her BP pills yet.  April 13, 2013:  Sydney Mccreedy has been admitted to the hospital since I last saw her. She had a stress Myoview study which revealed a fixed apical defect and no ischemia.  Ejection fraction remained moderately reduced at 35%.  The isosorbide was increased to 120 mg a day.  She continues to have some mild chest pain but thinks that the episodes are decreased from before the hospitalization. She does use sublingual nitroglycerin on a daily basis that several times a day infact.   August 09, 2013:  She is doing well.  No significant angina.     Dec. 1, 2015:  Sydney Moore is a 80 y.o. female who I see for CAD, pacer, HTN, and mild chronic systolic CHF. Has some generalized fatigue. Has rare episodes of angina and use NTG spray.   Symptoms seem to be stable . She has requested a handicap sticker.   Current Outpatient Prescriptions on File Prior to Visit  Medication Sig Dispense Refill  . amLODipine (NORVASC) 5 MG tablet Take 1 tablet (5 mg total) by mouth daily. 90 tablet 3  . aspirin 81 MG tablet Take 1 tablet (81 mg total) by mouth every morning. 90 tablet 1  .  atorvastatin (LIPITOR) 20 MG tablet Take 1 tablet (20 mg total) by mouth daily. 90 tablet 3  . calcium-vitamin D (OSCAL WITH D) 500-200 MG-UNIT per tablet Take 1 tablet by mouth daily.      . clobetasol cream (TEMOVATE) 0.05 % Apply 1 application topically as needed. Apply to affected on skin twice a day as needed for rash 30 g 0  . clopidogrel (PLAVIX) 75 MG tablet Take 1 tablet (75 mg total) by mouth daily. 90 tablet 3  . esomeprazole (NEXIUM) 40 MG capsule Take 40 mg by mouth daily before breakfast.    . furosemide (LASIX) 40 MG tablet take 1 tablet by mouth once daily 90 tablet 3  . isosorbide mononitrate (IMDUR) 120 MG 24 hr tablet take 1 tablet by  mouth once daily 30 tablet 5  . levothyroxine (SYNTHROID, LEVOTHROID) 50 MCG tablet Take 1 tablet (50 mcg total) by mouth daily. 90 tablet 1  . losartan (COZAAR) 50 MG tablet Take 1 tablet (50 mg total) by mouth daily. 90 tablet 3  . meclizine (ANTIVERT) 12.5 MG tablet take 1 tablet by mouth three times a day if needed for dizziness 30 tablet 0  . metoprolol succinate (TOPROL-XL) 100 MG 24 hr tablet Take 1 tablet (100 mg total) by mouth daily. Take with or immediately following a meal. 90 tablet 3  . metroNIDAZOLE (METROGEL) 1 % gel Apply 1 application topically daily as needed (for vaginal).    . nitroGLYCERIN (NITROLINGUAL) 0.4 MG/SPRAY spray Place 1 spray under the tongue every 5 (five) minutes x 3 doses as needed for chest pain.    . potassium chloride SA (K-DUR,KLOR-CON) 20 MEQ tablet Take 1 tablet (20 mEq total) by mouth every evening. 90 tablet 3  . promethazine-codeine (PHENERGAN WITH CODEINE) 6.25-10 MG/5ML syrup Take 5 mLs by mouth every 4 (four) hours as needed for cough. 180 mL 0  . ranolazine (RANEXA) 1000 MG SR tablet Take 1 tablet (1,000 mg total) by mouth 2 (two) times daily. 180 tablet 3   No current facility-administered medications on file prior to visit.    No Known Allergies  Past Medical History  Diagnosis Date  . Hypertension   . Hyperlipidemia   . Diabetes mellitus   . Chronic cough     Seen by Dr. Sherene Sires  . Hypothyroid   . CAD (coronary artery disease) 1989    a. 1989: s/p CABGx3;  b. 06/2007 Cath/Attempted PCI: LM min irregs, LAD 90/100, D1 90 (attempted PCI), RI 90, LCX100p, OM1 100, RCA 60/70p, 90/29m, VG->RCA nl, VG->OM 100, LIMA->LAD nl, EF 50%.  . Complete heart block     a. s/p PPM (MDT) by Dr Deborah Chalk 2004; b. Gen change 09/2010 (MDT ADDRL1 Adapta DC PPM Ser #:[id number]  . Carotid artery occlusion 2011    60-79%right/ 40-59% left  . GERD (gastroesophageal reflux disease)   . Chronic systolic CHF (congestive heart failure)     a. 03/2011 Echo: EF 40% inflat  HK, Gr1 DD, mod MR/TR  . Chest pain March 2014    Past Surgical History  Procedure Laterality Date  . Coronary artery bypass graft  1989  . Pacemaker placement  2004    MDT by Dr Deborah Chalk  . Cataract extraction  2004    Both  . Cardiac catheterization  2004, 2007, 2009  . Eye surgery      History  Smoking status  . Former Smoker  . Quit date: 12/15/1973  Smokeless tobacco  . Never Used  History  Alcohol Use No    Family History  Problem Relation Age of Onset  . Hypertension Mother   . Liver cancer Brother   . Breast cancer Daughter   . Colon cancer Brother   . Cancer Brother     colon    Reviw of Systems:  Reviewed in the HPI.  All other systems are negative.  Physical Exam: BP 120/62 mmHg  Pulse 80  Ht 5\' 1"  (1.549 m)  Wt 133 lb 9.6 oz (60.601 kg)  BMI 25.26 kg/m2  SpO2 98% The patient is alert and oriented x 3.  The mood and affect are normal.   Skin: warm and dry.  Color is normal.    HEENT:   the sclera are nonicteric.  The mucous membranes are moist.  The carotids are 2+ with soft bruits. .  There is no thyromegaly.  Pulsatile next is inferior to her carotid arteries that are either do to a large V wave or perhaps do to her common carotid artery.  Lungs: clear.  The chest wall is non tender.    Heart: regular rate with a normal S1 and S2.  There is a soft systolic murmur The PMI is not displaced.  Her pacer is in the right subclavian.  Abdomen: good bowel sounds.  There is no guarding or rebound.  There is no hepatosplenomegaly or tenderness.  There are no masses.   Extremities:  no clubbing, cyanosis, or edema.  The legs are without rashes.  The distal pulses are diminished  Neuro:  Cranial nerves II - XII are intact.  Motor and sensory functions are intact.    The gait is normal.  ECG:     Assessment / Plan:

## 2014-01-10 NOTE — Assessment & Plan Note (Signed)
She has a stable angina pattern.  Is on multiple medications.   Has angina and use NTG spray regularly.  We talked about stable angina vs. Unstable ( accelerating  angina)   I have advised her to call me if she has any increasing angina that is not controlled with the SL NTG.

## 2014-01-10 NOTE — Patient Instructions (Signed)
Your physician recommends that you continue on your current medications as directed. Please refer to the Current Medication list given to you today.  Your physician recommends that you have lab work:  TODAY - cholesterol, liver, bmet  Your physician wants you to follow-up in: 6 months with Dr. Elease Hashimoto.  You will receive a reminder letter in the mail two months in advance. If you don't receive a letter, please call our office to schedule the follow-up appointment. Your physician recommends that you return for lab work (cholesterol, liver, bmet) in: 6 months on the day of or a few days before your office visit with Dr. Elease Hashimoto.  You will need to FAST for this appointment - nothing to eat or drink after midnight the night before except water.

## 2014-01-13 ENCOUNTER — Telehealth: Payer: Self-pay | Admitting: Internal Medicine

## 2014-01-13 MED ORDER — MECLIZINE HCL 12.5 MG PO TABS: 12.5000 mg | ORAL_TABLET | ORAL | Status: AC | PRN

## 2014-01-13 NOTE — Telephone Encounter (Signed)
Patient is requesting a script for meclizine to be sent to [facility] [address].

## 2014-01-13 NOTE — Telephone Encounter (Signed)
Pt informed

## 2014-01-17 ENCOUNTER — Other Ambulatory Visit: Payer: Self-pay | Admitting: Internal Medicine

## 2014-03-01 ENCOUNTER — Telehealth: Payer: Self-pay | Admitting: Cardiology

## 2014-03-01 ENCOUNTER — Encounter: Payer: Medicare Other | Admitting: *Deleted

## 2014-03-01 NOTE — Telephone Encounter (Signed)
LMOVM reminding pt to send remote transmission.   

## 2014-03-02 ENCOUNTER — Encounter: Payer: Self-pay | Admitting: Cardiology

## 2014-03-13 ENCOUNTER — Ambulatory Visit (INDEPENDENT_AMBULATORY_CARE_PROVIDER_SITE_OTHER): Payer: Medicare Other | Admitting: *Deleted

## 2014-03-13 DIAGNOSIS — I442 Atrioventricular block, complete: Secondary | ICD-10-CM

## 2014-03-13 NOTE — Progress Notes (Signed)
Remote pacemaker transmission.   

## 2014-03-14 ENCOUNTER — Telehealth: Payer: Self-pay | Admitting: Internal Medicine

## 2014-03-14 LAB — MDC_IDC_ENUM_SESS_TYPE_REMOTE
Battery Voltage: 2.79 V
Brady Statistic AS VS Percent: 0 %
Date Time Interrogation Session: 20160201174439
Lead Channel Impedance Value: 467 Ohm
Lead Channel Pacing Threshold Amplitude: 0.75 V
Lead Channel Setting Pacing Amplitude: 2 V
Lead Channel Setting Pacing Amplitude: 2.5 V
Lead Channel Setting Pacing Pulse Width: 0.4 ms
Lead Channel Setting Sensing Sensitivity: 4 mV
MDC IDC MSMT BATTERY IMPEDANCE: 258 Ohm
MDC IDC MSMT BATTERY REMAINING LONGEVITY: 98 mo
MDC IDC MSMT LEADCHNL RA PACING THRESHOLD PULSEWIDTH: 0.4 ms
MDC IDC MSMT LEADCHNL RV IMPEDANCE VALUE: 671 Ohm
MDC IDC MSMT LEADCHNL RV PACING THRESHOLD AMPLITUDE: 0.5 V
MDC IDC MSMT LEADCHNL RV PACING THRESHOLD PULSEWIDTH: 0.4 ms
MDC IDC STAT BRADY AP VP PERCENT: 99 %
MDC IDC STAT BRADY AP VS PERCENT: 0 %
MDC IDC STAT BRADY AS VP PERCENT: 1 %

## 2014-03-14 NOTE — Telephone Encounter (Signed)
Informed pt that transmission was received.  

## 2014-03-14 NOTE — Telephone Encounter (Signed)
°  1. Has your device fired? no  2. Is you device beeping? no  3. Are you experiencing draining or swelling at device site? no  4. Are you calling to see if we received your device transmission? Yes  5. Have you passed out? No  Please call pt

## 2014-03-30 ENCOUNTER — Encounter: Payer: Self-pay | Admitting: Cardiology

## 2014-04-04 ENCOUNTER — Encounter: Payer: Self-pay | Admitting: Internal Medicine

## 2014-05-01 ENCOUNTER — Other Ambulatory Visit: Payer: Self-pay | Admitting: Internal Medicine

## 2014-05-02 ENCOUNTER — Other Ambulatory Visit: Payer: Self-pay | Admitting: Internal Medicine

## 2014-05-24 ENCOUNTER — Ambulatory Visit (INDEPENDENT_AMBULATORY_CARE_PROVIDER_SITE_OTHER): Payer: Medicare Other | Admitting: Podiatry

## 2014-05-24 DIAGNOSIS — L600 Ingrowing nail: Secondary | ICD-10-CM

## 2014-05-24 DIAGNOSIS — M79606 Pain in leg, unspecified: Secondary | ICD-10-CM

## 2014-05-24 DIAGNOSIS — B351 Tinea unguium: Secondary | ICD-10-CM | POA: Diagnosis not present

## 2014-05-24 NOTE — Progress Notes (Signed)
   Subjective:    Patient ID: Sydney Moore, female    DOB: 11/15/1923, 81 y.o.   MRN: [id number]  HPI Pt presents for foot exam, she is diabetic, has bilateral thickened and discolored nails, left great nail is splitting   Review of Systems  HENT: Positive for tinnitus.   Neurological: Positive for dizziness.  All other systems reviewed and are negative.      Objective:   Physical Exam        Assessment & Plan:

## 2014-06-12 ENCOUNTER — Telehealth: Payer: Self-pay | Admitting: Cardiology

## 2014-06-12 ENCOUNTER — Encounter: Payer: Medicare Other | Admitting: *Deleted

## 2014-06-12 NOTE — Telephone Encounter (Signed)
Spoke with pt and reminded pt of remote transmission that is due today. Pt verbalized understanding.   

## 2014-06-13 ENCOUNTER — Encounter: Payer: Self-pay | Admitting: Cardiology

## 2014-06-28 ENCOUNTER — Other Ambulatory Visit: Payer: Self-pay

## 2014-07-03 ENCOUNTER — Telehealth: Payer: Self-pay | Admitting: *Deleted

## 2014-07-03 DIAGNOSIS — I119 Hypertensive heart disease without heart failure: Secondary | ICD-10-CM

## 2014-07-03 DIAGNOSIS — I5042 Chronic combined systolic (congestive) and diastolic (congestive) heart failure: Secondary | ICD-10-CM

## 2014-07-03 DIAGNOSIS — I25708 Atherosclerosis of coronary artery bypass graft(s), unspecified, with other forms of angina pectoris: Secondary | ICD-10-CM

## 2014-07-03 MED ORDER — ATORVASTATIN CALCIUM 20 MG PO TABS: 20.0000 mg | ORAL_TABLET | Freq: Every day | ORAL | Status: DC

## 2014-07-03 MED ORDER — FUROSEMIDE 40 MG PO TABS: ORAL_TABLET | ORAL | Status: DC

## 2014-07-03 MED ORDER — CLOPIDOGREL BISULFATE 75 MG PO TABS
75.0000 mg | ORAL_TABLET | Freq: Every day | ORAL | Status: DC
Start: 2014-07-03 — End: 2015-06-01

## 2014-07-03 MED ORDER — METOPROLOL SUCCINATE ER 100 MG PO TB24: 100.0000 mg | ORAL_TABLET | Freq: Every day | ORAL | Status: DC

## 2014-07-03 MED ORDER — NITROGLYCERIN 0.4 MG/SPRAY TL SOLN: 1.0000 | Status: DC | PRN

## 2014-07-03 MED ORDER — LOSARTAN POTASSIUM 50 MG PO TABS: 50.0000 mg | ORAL_TABLET | Freq: Every day | ORAL | Status: DC

## 2014-07-03 MED ORDER — ISOSORBIDE MONONITRATE ER 120 MG PO TB24: 120.0000 mg | ORAL_TABLET | Freq: Every day | ORAL | Status: DC

## 2014-07-03 MED ORDER — RANOLAZINE ER 1000 MG PO TB12: 1000.0000 mg | ORAL_TABLET | Freq: Two times a day (BID) | ORAL | Status: DC

## 2014-07-03 MED ORDER — AMLODIPINE BESYLATE 5 MG PO TABS: 5.0000 mg | ORAL_TABLET | Freq: Every day | ORAL | Status: DC

## 2014-07-03 MED ORDER — POTASSIUM CHLORIDE CRYS ER 20 MEQ PO TBCR: 20.0000 meq | EXTENDED_RELEASE_TABLET | Freq: Every evening | ORAL | Status: DC

## 2014-07-03 NOTE — Telephone Encounter (Signed)
Patient called and stated that she needs an rx for all of her cardiac meds printed and placed at the front desk for pick up and take to [place]. She would like a call when they are ready. Thanks, MI

## 2014-07-03 NOTE — Telephone Encounter (Signed)
I called and advised patient that her printed prescriptions will be at the front desk today after 3 pm.  After filling prescriptions, I realized patient is taking Nexium with Plavix. I called and left a message for patient to call me back.

## 2014-07-03 NOTE — Telephone Encounter (Signed)
Spoke with patient and advised her of interaction between Nexium and Plavix.  She states she takes Nexium very infrequently and it is okay to d/c it.  I advised her to call back if she experiences symptoms of acid reflux and feels that she needs to be on another PPI; that Protonix would be prescribed in the future.  Patient verbalized understanding and agreement.

## 2014-07-04 ENCOUNTER — Telehealth: Payer: Self-pay | Admitting: Cardiovascular Disease

## 2014-07-04 MED ORDER — RANOLAZINE ER 1000 MG PO TB12: 1000.0000 mg | ORAL_TABLET | Freq: Two times a day (BID) | ORAL | Status: DC

## 2014-07-04 NOTE — Telephone Encounter (Signed)
New Message  Pt called states that RANEXA) 1000 MG SR tablet was left off of the medication list.

## 2014-07-04 NOTE — Telephone Encounter (Signed)
Spoke with patient and apologized for leaving out the Ranexa Rx for her printed prescriptions that she will take to [facility] next week.  I advised patient that the Rx will be signed by Dr. Elease Hashimoto tomorrow afternoon after 3 and she verbalized understanding.  She is aware that the 1 month supply of Ranexa is ready for her to pick up later today at [facility].

## 2014-07-07 ENCOUNTER — Other Ambulatory Visit: Payer: Medicare Other

## 2014-07-12 ENCOUNTER — Other Ambulatory Visit (INDEPENDENT_AMBULATORY_CARE_PROVIDER_SITE_OTHER): Payer: Medicare Other | Admitting: *Deleted

## 2014-07-12 DIAGNOSIS — I25810 Atherosclerosis of coronary artery bypass graft(s) without angina pectoris: Secondary | ICD-10-CM

## 2014-07-12 LAB — BASIC METABOLIC PANEL
BUN: 14 mg/dL (ref 6–23)
CALCIUM: 9.3 mg/dL (ref 8.4–10.5)
CO2: 32 mEq/L (ref 19–32)
Chloride: 102 mEq/L (ref 96–112)
Creatinine, Ser: 1 mg/dL (ref 0.40–1.20)
GFR: 67.18 mL/min (ref >60.00)
Glucose, Bld: 103 mg/dL — ABNORMAL HIGH (ref 70–99)
Potassium: 3.8 mEq/L (ref 3.5–5.1)
Sodium: 138 mEq/L (ref 135–145)

## 2014-07-12 LAB — LIPID PANEL
CHOL/HDL RATIO: 3
Cholesterol: 140 mg/dL (ref 0–200)
HDL: 42.2 mg/dL (ref >39.00)
LDL CALC: 78 mg/dL (ref 0–99)
NONHDL: 97.8
Triglycerides: 98 mg/dL (ref 0.0–149.0)
VLDL: 19.6 mg/dL (ref 0.0–40.0)

## 2014-07-12 LAB — HEPATIC FUNCTION PANEL
ALK PHOS: 68 U/L (ref 39–117)
ALT: 13 U/L (ref 0–35)
AST: 25 U/L (ref 0–37)
Albumin: 4.1 g/dL (ref 3.5–5.2)
BILIRUBIN DIRECT: 0.2 mg/dL (ref 0.0–0.3)
Total Bilirubin: 0.6 mg/dL (ref 0.2–1.2)
Total Protein: 7.5 g/dL (ref 6.0–8.3)

## 2014-07-13 ENCOUNTER — Ambulatory Visit: Payer: Medicare Other | Admitting: Cardiovascular Disease

## 2014-07-14 ENCOUNTER — Encounter: Payer: Self-pay | Admitting: *Deleted

## 2014-07-14 ENCOUNTER — Ambulatory Visit (INDEPENDENT_AMBULATORY_CARE_PROVIDER_SITE_OTHER): Payer: Medicare Other

## 2014-07-14 VITALS — BP 110/60 | HR 65 | Resp 18 | Ht 63.0 in | Wt 128.0 lb

## 2014-07-14 DIAGNOSIS — R0789 Other chest pain: Secondary | ICD-10-CM | POA: Diagnosis not present

## 2014-07-14 NOTE — Progress Notes (Signed)
1.) Reason for visit: BP low/ patient missed her appointment yesterday with Dr. Elease Hashimoto  2.) Name of MD requesting visit: Patient walked in thinking her appointment was today.  3.) H&P: Patient has a history of CAD-s/p CABG, pacer for advanced AV block, PVD, CHF, DM, Hypothyroidism, and bilateral carotid bruits.  From Dr. Harvie Bridge previous office note, patient has on going chest pain that is relieved with NTG.   Patient had a EKG that showed AV dual -paced rhythm with prolonged AV conduction. Vital signs:  BP 110/60, HR 80, Resp. 18 and O2 96% on room air.  4.) ROS related to problem: Patient c/o having chest pain yesterday. Patient took NTG and chest pain was relieved. Vital signs are stable. Patient appears not to be in any distress.   5.) Assessment and plan per MD: Consulted Dr. Elease Hashimoto, since vital signs and EKG are stable, and patient is on the max medication for her condition, patient will follow-up in August. Patient will continue with current plan of care.

## 2014-07-14 NOTE — Patient Instructions (Addendum)
Medication Instructions:  Your physician recommends that you continue on your current medications as directed. Please refer to the Current Medication list given to you today.  Labwork: NONE  Testing/Procedures: NONE  Follow-Up: Your physician recommends that you keep your already scheduled appointment.  Any Other Special Instructions Will Be Listed Below (If Applicable).

## 2014-07-21 ENCOUNTER — Ambulatory Visit (INDEPENDENT_AMBULATORY_CARE_PROVIDER_SITE_OTHER): Payer: Medicare Other | Admitting: *Deleted

## 2014-07-21 DIAGNOSIS — I442 Atrioventricular block, complete: Secondary | ICD-10-CM | POA: Diagnosis not present

## 2014-07-21 NOTE — Progress Notes (Signed)
Remote pacemaker transmission.   

## 2014-07-27 LAB — CUP PACEART REMOTE DEVICE CHECK
Battery Impedance: 282 Ohm
Battery Remaining Longevity: 95 mo
Battery Voltage: 2.79 V
Brady Statistic AP VS Percent: 0 %
Brady Statistic AS VP Percent: 1 %
Brady Statistic AS VS Percent: 0 %
Date Time Interrogation Session: 20160610133454
Lead Channel Impedance Value: 466 Ohm
Lead Channel Pacing Threshold Amplitude: 0.5 V
Lead Channel Pacing Threshold Amplitude: 0.875 V
Lead Channel Pacing Threshold Pulse Width: 0.4 ms
Lead Channel Setting Pacing Amplitude: 2 V
Lead Channel Setting Pacing Pulse Width: 0.4 ms
Lead Channel Setting Sensing Sensitivity: 4 mV
MDC IDC MSMT LEADCHNL RA PACING THRESHOLD PULSEWIDTH: 0.4 ms
MDC IDC MSMT LEADCHNL RV IMPEDANCE VALUE: 622 Ohm
MDC IDC SET LEADCHNL RV PACING AMPLITUDE: 2.5 V
MDC IDC STAT BRADY AP VP PERCENT: 99 %

## 2014-08-03 ENCOUNTER — Encounter: Payer: Self-pay | Admitting: Cardiology

## 2014-08-04 ENCOUNTER — Encounter: Payer: Self-pay | Admitting: Family

## 2014-08-07 ENCOUNTER — Other Ambulatory Visit (HOSPITAL_COMMUNITY): Payer: Federal, State, Local not specified - PPO

## 2014-08-07 ENCOUNTER — Ambulatory Visit: Payer: Federal, State, Local not specified - PPO | Admitting: Family

## 2014-08-08 ENCOUNTER — Encounter: Payer: Self-pay | Admitting: Vascular Surgery

## 2014-08-09 ENCOUNTER — Encounter: Payer: Self-pay | Admitting: Family

## 2014-08-09 ENCOUNTER — Ambulatory Visit (INDEPENDENT_AMBULATORY_CARE_PROVIDER_SITE_OTHER): Payer: Medicare Other | Admitting: Family

## 2014-08-09 ENCOUNTER — Ambulatory Visit (HOSPITAL_COMMUNITY)
Admission: RE | Admit: 2014-08-09 | Discharge: 2014-08-09 | Disposition: A | Discharge status: Home / Self Care | Payer: Medicare Other | Source: Ambulatory Visit | Attending: Family | Admitting: Family

## 2014-08-09 ENCOUNTER — Encounter: Payer: Self-pay | Admitting: Internal Medicine

## 2014-08-09 VITALS — BP 135/68 | HR 74 | Resp 16 | Ht 60.0 in | Wt 129.0 lb

## 2014-08-09 DIAGNOSIS — I6523 Occlusion and stenosis of bilateral carotid arteries: Secondary | ICD-10-CM

## 2014-08-09 DIAGNOSIS — Z87891 Personal history of nicotine dependence: Secondary | ICD-10-CM

## 2014-08-09 DIAGNOSIS — Z48812 Encounter for surgical aftercare following surgery on the circulatory system: Secondary | ICD-10-CM | POA: Diagnosis not present

## 2014-08-09 NOTE — Patient Instructions (Signed)
Stroke Prevention Some medical conditions and behaviors are associated with an increased chance of having a stroke. You may prevent a stroke by making healthy choices and managing medical conditions. HOW CAN I REDUCE MY RISK OF HAVING A STROKE?   Stay physically active. Get at least 30 minutes of activity on most or all days.  Do not smoke. It may also be helpful to avoid exposure to secondhand smoke.  Limit alcohol use. Moderate alcohol use is considered to be:  No more than 2 drinks per day for men.  No more than 1 drink per day for nonpregnant women.  Eat healthy foods. This involves:  Eating 5 or more servings of fruits and vegetables a day.  Making dietary changes that address high blood pressure (hypertension), high cholesterol, diabetes, or obesity.  Manage your cholesterol levels.  Making food choices that are high in fiber and low in saturated fat, trans fat, and cholesterol may control cholesterol levels.  Take any prescribed medicines to control cholesterol as directed by your health care provider.  Manage your diabetes.  Controlling your carbohydrate and sugar intake is recommended to manage diabetes.  Take any prescribed medicines to control diabetes as directed by your health care provider.  Control your hypertension.  Making food choices that are low in salt (sodium), saturated fat, trans fat, and cholesterol is recommended to manage hypertension.  Take any prescribed medicines to control hypertension as directed by your health care provider.  Maintain a healthy weight.  Reducing calorie intake and making food choices that are low in sodium, saturated fat, trans fat, and cholesterol are recommended to manage weight.  Stop drug abuse.  Avoid taking birth control pills.  Talk to your health care provider about the risks of taking birth control pills if you are over 35 years old, smoke, get migraines, or have ever had a blood clot.  Get evaluated for sleep  disorders (sleep apnea).  Talk to your health care provider about getting a sleep evaluation if you snore a lot or have excessive sleepiness.  Take medicines only as directed by your health care provider.  For some people, aspirin or blood thinners (anticoagulants) are helpful in reducing the risk of forming abnormal blood clots that can lead to stroke. If you have the irregular heart rhythm of atrial fibrillation, you should be on a blood thinner unless there is a good reason you cannot take them.  Understand all your medicine instructions.  Make sure that other conditions (such as anemia or atherosclerosis) are addressed. SEEK IMMEDIATE MEDICAL CARE IF:   You have sudden weakness or numbness of the face, arm, or leg, especially on one side of the body.  Your face or eyelid droops to one side.  You have sudden confusion.  You have trouble speaking (aphasia) or understanding.  You have sudden trouble seeing in one or both eyes.  You have sudden trouble walking.  You have dizziness.  You have a loss of balance or coordination.  You have a sudden, severe headache with no known cause.  You have new chest pain or an irregular heartbeat. Any of these symptoms may represent a serious problem that is an emergency. Do not wait to see if the symptoms will go away. Get medical help at once. Call your local emergency services (911 in [place] Do not drive yourself to the hospital. Document Released: 03/06/2004 Document Revised: 06/13/2013 Document Reviewed: 07/30/2012 Exit[employer] Patient Information 2015 [employer]. This information is not intended to replace advice given   to you by your health care provider. Make sure you discuss any questions you have with your health care provider.  

## 2014-08-09 NOTE — Progress Notes (Signed)
Established Carotid Patient   History of Present Illness  Sydney Moore is a 81 y.o. female patient of Dr. Myra Gianotti. The patient is back today for continued surveillance of her carotid artery occlusive disease. She remains asymptomatic. Specifically, she denies numbness or weakness in either extremity. She denies slurred speech. She denies amaurosis fugax.  She has a history of CABG in 1989, had a silent MI, has a pacemaker for complete heart block. She denies claudication symptoms in her legs with walking, denies non healing wounds. She attends an exercise class twice/week.  Patient has not had previous carotid artery intervention.  Pt denies New Medical or Surgical History.  Pt Diabetic: Yes, diet controlled Pt smoker: former smoker, quit in 1975  Pt meds include: Statin : Yes ASA: Yes Other anticoagulants/antiplatelets: Plavix  Past Medical History  Diagnosis Date  . Hypertension   . Hyperlipidemia   . Diabetes mellitus   . Chronic cough     Seen by Dr. Sherene Sires  . Hypothyroid   . CAD (coronary artery disease) 1989    a. 1989: s/p CABGx3;  b. 06/2007 Cath/Attempted PCI: LM min irregs, LAD 90/100, D1 90 (attempted PCI), RI 90, LCX100p, OM1 100, RCA 60/70p, 90/58m, VG->RCA nl, VG->OM 100, LIMA->LAD nl, EF 50%.  . Complete heart block     a. s/p PPM (MDT) by Dr Deborah Chalk 2004; b. Gen change 09/2010 (MDT ADDRL1 Adapta DC PPM Ser #:[id number]  . Carotid artery occlusion 2011    60-79%right/ 40-59% left  . GERD (gastroesophageal reflux disease)   . Chronic systolic CHF (congestive heart failure)     a. 03/2011 Echo: EF 40% inflat HK, Gr1 DD, mod MR/TR  . Chest pain March 2014    Social History History  Substance Use Topics  . Smoking status: Former Smoker    Quit date: 12/15/1973  . Smokeless tobacco: Never Used  . Alcohol Use: No    Family History Family History  Problem Relation Age of Onset  . Hypertension Mother   . Liver cancer Brother   . Breast cancer Daughter    . Colon cancer Brother   . Cancer Brother     colon    Surgical History Past Surgical History  Procedure Laterality Date  . Coronary artery bypass graft  1989  . Pacemaker placement  2004    MDT by Dr Deborah Chalk  . Cataract extraction  2004    Both  . Cardiac catheterization  2004, 2007, 2009  . Eye surgery      No Known Allergies  Current Outpatient Prescriptions  Medication Sig Dispense Refill  . levothyroxine (SYNTHROID, LEVOTHROID) 50 MCG tablet take 1 tablet by mouth once daily 90 tablet 0  . amLODipine (NORVASC) 5 MG tablet Take 1 tablet (5 mg total) by mouth daily. 90 tablet 3  . aspirin 81 MG tablet Take 1 tablet (81 mg total) by mouth every morning. 90 tablet 1  . atorvastatin (LIPITOR) 20 MG tablet Take 1 tablet (20 mg total) by mouth daily. 90 tablet 3  . calcium-vitamin D (OSCAL WITH D) 500-200 MG-UNIT per tablet Take 1 tablet by mouth daily.      . clopidogrel (PLAVIX) 75 MG tablet Take 1 tablet (75 mg total) by mouth daily. 90 tablet 3  . furosemide (LASIX) 40 MG tablet take 1 tablet by mouth once daily 90 tablet 3  . isosorbide mononitrate (IMDUR) 120 MG 24 hr tablet Take 1 tablet (120 mg total) by mouth daily. 90 tablet 3  .  levothyroxine (SYNTHROID, LEVOTHROID) 50 MCG tablet take 1 tablet by mouth once daily (Patient not taking: Reported on 08/09/2014) 90 tablet 0  . losartan (COZAAR) 50 MG tablet Take 1 tablet (50 mg total) by mouth daily. (Patient taking differently: Take 25 mg by mouth daily. ) 90 tablet 3  . meclizine (ANTIVERT) 12.5 MG tablet Take 1 tablet (12.5 mg total) by mouth as needed for dizziness (up to three times a day). 30 tablet 0  . metoprolol succinate (TOPROL-XL) 100 MG 24 hr tablet Take 1 tablet (100 mg total) by mouth daily. Take with or immediately following a meal. 90 tablet 3  . nitroGLYCERIN (NITROLINGUAL) 0.4 MG/SPRAY spray Place 1 spray under the tongue every 5 (five) minutes x 3 doses as needed for chest pain. 12 g 3  . potassium chloride  SA (K-DUR,KLOR-CON) 20 MEQ tablet Take 1 tablet (20 mEq total) by mouth every evening. 90 tablet 3  . promethazine-codeine (PHENERGAN WITH CODEINE) 6.25-10 MG/5ML syrup Take 5 mLs by mouth every 4 (four) hours as needed for cough. 180 mL 0  . ranolazine (RANEXA) 1000 MG SR tablet Take 1 tablet (1,000 mg total) by mouth 2 (two) times daily. 180 tablet 3   No current facility-administered medications for this visit.    Review of Systems : See HPI for pertinent positives and negatives.  Physical Examination  Filed Vitals:   08/09/14 1538 08/09/14 1541  BP: 144/72 135/68  Pulse: 78 74  Resp:  16  Height:  5' (1.524 m)  Weight:  129 lb (58.514 kg)  SpO2:  99%   Body mass index is 25.19 kg/(m^2).  General: WDWN female in NAD GAIT: normal Eyes: PERRLA Pulmonary: Non-labored, CTAB, Negative Rales, Negative rhonchi, & Negative wheezing.  Cardiac: regular Rhythm, no detected murmur.  VASCULAR EXAM Carotid Bruits Left Right   Positive Negative   Radial pulses are 2+ palpable and equal.      LE Pulses LEFT RIGHT   POPLITEAL not palpable  not palpable   POSTERIOR TIBIAL not palpable  not palpable    DORSALIS PEDIS  ANTERIOR TIBIAL palpable  palpable     Gastrointestinal: soft, nontender, BS WNL, no r/g, no palpable masses.  Musculoskeletal: Negative muscle atrophy/wasting. M/S 5/5 throughout, Extremities without ischemic changes.  Neurologic: A&O X 3; Appropriate Affect,  Speech is normal CN 2-12 intact, Pain and light touch intact in extremities, Motor exam as listed above.          Non-Invasive Vascular Imaging CAROTID DUPLEX 08/09/2014   CEREBROVASCULAR DUPLEX EVALUATION    INDICATION: Carotid artery disease    PREVIOUS INTERVENTION(S): None    DUPLEX EXAM: Carotid duplex    RIGHT   LEFT  Peak Systolic Velocities (cm/s) End Diastolic Velocities (cm/s) Plaque LOCATION Peak Systolic Velocities (cm/s) End Diastolic Velocities (cm/s) Plaque  79 9 HT CCA PROXIMAL 116 6 -  34 6 - CCA MID 49 9 -  33 10 CP CCA DISTAL 66 10 -  129 8 CP ECA 384 0 CP  413 94 CP ICA PROXIMAL 129 9 CP  166 28 - ICA MID 52 10 -  117 17 - ICA DISTAL 50 13 -    12.1 ICA / CCA Ratio (PSV) 2.6  Antegrade Vertebral Flow Antegrade  152 Brachial Systolic Pressure (mmHg) 152  Triphasic Brachial Artery Waveforms Triphasic    Plaque Morphology:  HM = Homogeneous, HT = Heterogeneous, CP = Calcific Plaque, SP = Smooth Plaque, IP = Irregular Plaque  ADDITIONAL FINDINGS:  IMPRESSION: 1. 60 - 79% right internal carotid artery stenosis, calcific plaque may obscure higher velocity 2. Less than 40% left internal carotid artery stenosis, calcific plaque may obscure higher velocity. 3. Greater than 50% left external carotid artery stenosis.    Compared to the previous exam:  Increased velocity      Assessment: STEPANIE GRAVER is a 81 y.o. female who has no history of stroke or TIA. Today's carotid Duplex suggests 60 - 79% right internal carotid artery stenosis and minimal left ICA stenosis Increased stenosis of the right ICA compared to a year ago.  Plan: Follow-up in 6 months with Carotid Duplex.   I discussed in depth with the patient the nature of atherosclerosis, and emphasized the importance of maximal medical management including strict control of blood pressure, blood glucose, and lipid levels, obtaining regular exercise, and continued cessation of smoking.  The patient is aware that without maximal medical management the underlying atherosclerotic disease process will progress, limiting the benefit of any interventions. The patient was given information about stroke prevention and what symptoms should prompt the patient to seek immediate medical care. Thank you for allowing [place] to participate in  this patient's care.  Charisse March, RN, MSN, FNP-C [facility] Office: [phone]  Clinic Physician: Edilia Bo  08/09/2014 3:45 PM

## 2014-08-10 NOTE — Addendum Note (Signed)
Addended by: Adria Dill L on: 08/10/2014 11:41 AM   Modules accepted: Orders

## 2014-09-12 LAB — HM DIABETES EYE EXAM

## 2014-09-14 ENCOUNTER — Ambulatory Visit (INDEPENDENT_AMBULATORY_CARE_PROVIDER_SITE_OTHER): Payer: Medicare Other | Admitting: Cardiovascular Disease

## 2014-09-14 ENCOUNTER — Encounter: Payer: Self-pay | Admitting: Cardiovascular Disease

## 2014-09-14 VITALS — BP 110/56 | HR 72 | Ht 60.0 in | Wt 129.4 lb

## 2014-09-14 DIAGNOSIS — I6523 Occlusion and stenosis of bilateral carotid arteries: Secondary | ICD-10-CM | POA: Diagnosis not present

## 2014-09-14 DIAGNOSIS — I739 Peripheral vascular disease, unspecified: Secondary | ICD-10-CM

## 2014-09-14 DIAGNOSIS — E785 Hyperlipidemia, unspecified: Secondary | ICD-10-CM

## 2014-09-14 DIAGNOSIS — I25810 Atherosclerosis of coronary artery bypass graft(s) without angina pectoris: Secondary | ICD-10-CM

## 2014-09-14 DIAGNOSIS — I779 Disorder of arteries and arterioles, unspecified: Secondary | ICD-10-CM

## 2014-09-14 NOTE — Patient Instructions (Signed)

## 2014-09-14 NOTE — Progress Notes (Signed)
Sydney Moore Date of Birth  1923-11-24 [facility] [address]     [address] [place], [place]  [address] [phone]  Fax  [phone]   Problems: 1. CAD - s/p CABG 1989  2. Pacer - for advanced AV block 3. PVD- left carotid bruit 4. CHF - EF of 40% 5. Diabetes Mellitus 6. Hypothyroidism 7. Bilateral carotid bruits:    History of Present Illness:  81 year old female with a history of coronary artery disease and pacemaker implant.  She has a history of peripheral vascular disease and has a known left carotid bruit which we are following.   She's had a history of coronary artery bypass grafting. She denies any shortness of breath or chest pain. She does complain of some leg weakness particularly with walking.  Check pacer generator changed in October. She has developed some keloid formation over the pacer incision  A recent echocardiogram reveals mild to moderate left ventricular dysfunction with an ejection fraction of 40%.  There is moderate mitral regurgitation and moderate tricuspid regurgitation. Right ventricle appears to be dilated. Her pulmonary pressures are at the upper limits of normal at 36 mmHg. Her Lasix dose was increased by Dr. Ladona Ridgel and she's feeling a bit better.  Septeber 17, 2013 - she has done well.  She has occasional episodes of CP when walking - goes away very quickly with a SL NTG.  These episodes of chest pain are very rare. She's able to do most of her normal activities without any significant problems. She has known stenosis in her ramus intermediate branch that was not amenable to PCI. We were not able to put a wire down that vessel. At that point she was started on Ranexa and has done well since that time.  June 27 ,2014: Sydney Moore is doing well.  She has occasional CP and takes NTG occasionally.  Feb. 9, 2015: Sydney Moore is doing ok.  She still has some exertional angina relieved with SL NTG.   She is able to  do all of her normal daily activities without significant limitations as long as she takes the nitroglycerin.   She is on Ranexa 1000 BID.    Her BP is high this am - she has not taken her BP pills yet.  April 13, 2013:  Sydney Moore has been admitted to the hospital since I last saw her. She had a stress Myoview study which revealed a fixed apical defect and no ischemia.  Ejection fraction remained moderately reduced at 35%.  The isosorbide was increased to 120 mg a day.  She continues to have some mild chest pain but thinks that the episodes are decreased from before the hospitalization. She does use sublingual nitroglycerin on a daily basis that several times a day infact.   August 09, 2013:  She is doing well.  No significant angina.     Dec. 1, 2015:  Sydney Moore is a 81 y.o. female who I see for CAD, pacer, HTN, and mild chronic systolic CHF. Has some generalized fatigue. Has rare episodes of angina and use NTG spray.   Symptoms seem to be stable . She has requested a handicap sticker.   Aug. 4, 2016:  Still having some angina. Resolves with 1 SL NTG.  No syncope    Current Outpatient Prescriptions on File Prior to Visit  Medication Sig Dispense Refill  . amLODipine (NORVASC) 5 MG tablet Take 1 tablet (5 mg total) by mouth daily. 90 tablet  3  . aspirin 81 MG tablet Take 1 tablet (81 mg total) by mouth every morning. 90 tablet 1  . atorvastatin (LIPITOR) 20 MG tablet Take 1 tablet (20 mg total) by mouth daily. 90 tablet 3  . calcium-vitamin D (OSCAL WITH D) 500-200 MG-UNIT per tablet Take 1 tablet by mouth daily.      . clopidogrel (PLAVIX) 75 MG tablet Take 1 tablet (75 mg total) by mouth daily. 90 tablet 3  . furosemide (LASIX) 40 MG tablet take 1 tablet by mouth once daily 90 tablet 3  . isosorbide mononitrate (IMDUR) 120 MG 24 hr tablet Take 1 tablet (120 mg total) by mouth daily. 90 tablet 3  . levothyroxine (SYNTHROID, LEVOTHROID) 50 MCG tablet take 1 tablet by mouth once daily 90  tablet 0  . losartan (COZAAR) 50 MG tablet Take 1 tablet (50 mg total) by mouth daily. (Patient taking differently: Take 25 mg by mouth daily. ) 90 tablet 3  . meclizine (ANTIVERT) 12.5 MG tablet Take 1 tablet (12.5 mg total) by mouth as needed for dizziness (up to three times a day). 30 tablet 0  . metoprolol succinate (TOPROL-XL) 100 MG 24 hr tablet Take 1 tablet (100 mg total) by mouth daily. Take with or immediately following a meal. 90 tablet 3  . nitroGLYCERIN (NITROLINGUAL) 0.4 MG/SPRAY spray Place 1 spray under the tongue every 5 (five) minutes x 3 doses as needed for chest pain. 12 g 3  . potassium chloride SA (K-DUR,KLOR-CON) 20 MEQ tablet Take 1 tablet (20 mEq total) by mouth every evening. 90 tablet 3  . promethazine-codeine (PHENERGAN WITH CODEINE) 6.25-10 MG/5ML syrup Take 5 mLs by mouth every 4 (four) hours as needed for cough. 180 mL 0  . ranolazine (RANEXA) 1000 MG SR tablet Take 1 tablet (1,000 mg total) by mouth 2 (two) times daily. 180 tablet 3  . MYRBETRIQ 25 MG TB24 tablet Take 25 mg by mouth daily. Pt not taking right now  1   No current facility-administered medications on file prior to visit.    No Known Allergies  Past Medical History  Diagnosis Date  . Hypertension   . Hyperlipidemia   . Diabetes mellitus   . Chronic cough     Seen by Dr. Sherene Sires  . Hypothyroid   . CAD (coronary artery disease) 1989    a. 1989: s/p CABGx3;  b. 06/2007 Cath/Attempted PCI: LM min irregs, LAD 90/100, D1 90 (attempted PCI), RI 90, LCX100p, OM1 100, RCA 60/70p, 90/92m, VG->RCA nl, VG->OM 100, LIMA->LAD nl, EF 50%.  . Complete heart block     a. s/p PPM (MDT) by Dr Deborah Chalk 2004; b. Gen change 09/2010 (MDT ADDRL1 Adapta DC PPM Ser #:[id number]  . Carotid artery occlusion 2011    60-79%right/ 40-59% left  . GERD (gastroesophageal reflux disease)   . Chronic systolic CHF (congestive heart failure)     a. 03/2011 Echo: EF 40% inflat HK, Gr1 DD, mod MR/TR  . Chest pain March 2014    Past  Surgical History  Procedure Laterality Date  . Coronary artery bypass graft  1989  . Pacemaker placement  2004    MDT by Dr Deborah Chalk  . Cataract extraction  2004    Both  . Cardiac catheterization  2004, 2007, 2009  . Eye surgery      History  Smoking status  . Former Smoker  . Quit date: 12/15/1973  Smokeless tobacco  . Never Used    History  Alcohol Use  No    Family History  Problem Relation Age of Onset  . Hypertension Mother   . Liver cancer Brother   . Breast cancer Daughter   . Colon cancer Brother   . Cancer Brother     colon    Reviw of Systems:  Reviewed in the HPI.  All other systems are negative.  Physical Exam: BP 110/56 mmHg  Pulse 72  Ht 5' (1.524 m)  Wt 58.695 kg (129 lb 6.4 oz)  BMI 25.27 kg/m2  SpO2 95% The patient is alert and oriented x 3.  The mood and affect are normal.   Skin: warm and dry.  Color is normal.    HEENT:   the sclera are nonicteric.  The mucous membranes are moist.  The carotids are 2+ with soft bruits. .  There is no thyromegaly.  Pulsatile next is inferior to her carotid arteries that are either do to a large V wave or perhaps do to her common carotid artery.  Lungs: clear.  The chest wall is non tender.    Heart: regular rate with a normal S1 and S2.  There is a soft systolic murmur The PMI is not displaced.  Her pacer is in the right subclavian.  Abdomen: good bowel sounds.  There is no guarding or rebound.  There is no hepatosplenomegaly or tenderness.  There are no masses.   Extremities:  no clubbing, cyanosis, or edema.  The legs are without rashes.  The distal pulses are diminished  Neuro:  Cranial nerves II - XII are intact.  Motor and sensory functions are intact.    The gait is normal.  ECG:     Assessment / Plan:   1. CAD - s/p CABG 1989  -  Doing well,  Occasional episodes of angina .  Relieved quickly with 1 SL NTG. Continue current meds.   2. Pacer - for advanced AV block 3. PVD- left carotid  bruit 4. CHF - EF of 40% 5. Diabetes Mellitus - managed by diet now.  6. Hypothyroidism 7. Bilateral carotid bruits:   Stable  8. Hyperlipidemia:  Stable , continue atorvastatin 20 , will check lipids in 6 months with her next office visit .     Nahser, Deloris Ping, MD  09/14/2014 8:09 AM    [facility] [address] [place],  [address] [place], [place]  [address] Pager [phone] Phone: [phone]; Fax: [phone]   [facility]  [address] [place], [place]  [address] [phone]   Fax [phone]

## 2014-09-18 ENCOUNTER — Encounter: Payer: Self-pay | Admitting: Internal Medicine

## 2014-09-18 ENCOUNTER — Other Ambulatory Visit (INDEPENDENT_AMBULATORY_CARE_PROVIDER_SITE_OTHER): Payer: Medicare Other

## 2014-09-18 ENCOUNTER — Ambulatory Visit (INDEPENDENT_AMBULATORY_CARE_PROVIDER_SITE_OTHER): Payer: Medicare Other | Admitting: Internal Medicine

## 2014-09-18 ENCOUNTER — Other Ambulatory Visit: Payer: Self-pay

## 2014-09-18 VITALS — BP 178/70 | HR 97 | Temp 97.6°F | Resp 20 | Wt 132.0 lb

## 2014-09-18 DIAGNOSIS — I6523 Occlusion and stenosis of bilateral carotid arteries: Secondary | ICD-10-CM | POA: Diagnosis not present

## 2014-09-18 DIAGNOSIS — R079 Chest pain, unspecified: Secondary | ICD-10-CM

## 2014-09-18 DIAGNOSIS — R059 Cough, unspecified: Secondary | ICD-10-CM

## 2014-09-18 DIAGNOSIS — Z1231 Encounter for screening mammogram for malignant neoplasm of breast: Secondary | ICD-10-CM

## 2014-09-18 DIAGNOSIS — I25708 Atherosclerosis of coronary artery bypass graft(s), unspecified, with other forms of angina pectoris: Secondary | ICD-10-CM | POA: Diagnosis not present

## 2014-09-18 DIAGNOSIS — R05 Cough: Secondary | ICD-10-CM

## 2014-09-18 LAB — TROPONIN I: TNIDX: 0.04 ug/l (ref 0.00–0.06)

## 2014-09-18 MED ORDER — BENZONATATE 100 MG PO CAPS: 100.0000 mg | ORAL_CAPSULE | Freq: Two times a day (BID) | ORAL | Status: DC | PRN

## 2014-09-18 NOTE — Patient Instructions (Addendum)
  Your next office appointment will be determined based upon review of your pending labs  and  xrays  Those written interpretation of the lab results and instructions will be transmitted to you by mail for your records.  Critical results will be called.   Followup as needed for any active or acute issue. Please report any significant change in your symptoms.  Reflux of gastric acid can cause cough mainly @ night; this may occur mainly during sleep.The triggers for reflux  include stress; the "aspirin family" ; alcohol; peppermint; and caffeine (coffee, tea, cola, and chocolate). The aspirin family would include aspirin and the nonsteroidal agents such as ibuprofen &  Naproxen. Tylenol would not cause reflux. If having night time cough ; food & drink should be avoided for @ least 2 hours before going to bed.

## 2014-09-18 NOTE — Progress Notes (Signed)
   Subjective:    Patient ID: Sydney Moore, female    DOB: [birth year], 81 y.o.   MRN: [id number]  HPI  She came in to be evaluated for a cough. She states that she did have chest pain on the way here and took a nitroglycerin with relief. The pain is described as substernal without associated radiation, nausea, or diaphoresis. She states this is exertional in nature.  She has significant history of coronary artery disease for which she's had bypass surgery in 1989. She's had cardiac catheterizations in 2004, 2007, 2009. She also has a pacemaker in place.  She saw her Cardiologist Dr Elease Hashimoto 09/14/14. He was made aware of her recurrent chest pain.  The cough is nonproductive and intermittent over the last week and mainly at night. There are no associated upper respiratory tract, extrinsic, or reflux symptoms. She is not on ACE inhibitor.  Labs 07/12/14 revealed  an LDL of 78 and HDL of 42.2. Renal function was normal. Glucose was 103.    Review of Systems  There is no sputum production,hemoptysis, wheezing,or  paroxysmal nocturnal dyspnea. Unexplained weight loss, abdominal pain, significant dyspepsia, dysphagia, melena, rectal bleeding, or persistently small caliber stools are not present. Frontal headache, facial pain , nasal purulence, dental pain, sore throat , otic pain or otic discharge denied. No fever or chills .Some intermittent  sweats.    Objective:   Physical Exam  Pertinent or positive findings include: She appears younger than her stated age. She has dense arcus senilis. Faint left carotid bruit is present. There is a well-healed keloid operative scar over the anterior chest. Grade 1/2 systolic murmur. Pedal pulses are decreased.  General appearance :adequately nourished; in no distress.  Eyes: No conjunctival inflammation or scleral icterus is present.  Oral exam:  Lips and gums are healthy appearing.There is no oropharyngeal erythema or exudate noted. Dental hygiene is  good.  Heart:  Normal rate and regular rhythm. S1 and S2 normal without gallop, click, rub or other extra sounds    Lungs:Chest clear to auscultation; no wheezes, rhonchi,rales ,or rubs present.No increased work of breathing.   Abdomen: bowel sounds normal, soft and non-tender without masses, organomegaly or hernias noted.  No guarding or rebound.   Vascular : all pulses equal ; no bruits present.  Skin:Warm & dry.  Intact without suspicious lesions or rashes ; no tenting   Lymphatic: No lymphadenopathy is noted about the head, neck, axilla.   Neuro: Strength, tone normal.         Assessment & Plan:  #1 chest pain EKG is unchanged versus 07/14/14. There is a paced rhythm rate of 73 with prolonged AV conduction.  #2 cough without symptoms or signs of upper respiratory tract infection, reactive airways disease, or reflux. Symptomatic treatment will be prescribed.  #3 history of significant coronary artery disease. I'll ask Dr.Nahser if she is a candidate for long-acting nitrate therapy.   Plan: See orders and recommendations

## 2014-09-18 NOTE — Progress Notes (Signed)
Pre visit review using our clinic review tool, if applicable. No additional management support is needed unless otherwise documented below in the visit note. 

## 2014-10-27 ENCOUNTER — Other Ambulatory Visit: Payer: Self-pay | Admitting: Internal Medicine

## 2014-10-31 ENCOUNTER — Ambulatory Visit: Payer: Medicare Other

## 2014-10-31 ENCOUNTER — Other Ambulatory Visit (INDEPENDENT_AMBULATORY_CARE_PROVIDER_SITE_OTHER): Payer: Medicare Other

## 2014-10-31 ENCOUNTER — Encounter: Payer: Self-pay | Admitting: Internal Medicine

## 2014-10-31 ENCOUNTER — Ambulatory Visit (INDEPENDENT_AMBULATORY_CARE_PROVIDER_SITE_OTHER): Payer: Medicare Other | Admitting: Internal Medicine

## 2014-10-31 ENCOUNTER — Telehealth: Payer: Self-pay | Admitting: *Deleted

## 2014-10-31 VITALS — BP 136/66 | HR 84 | Temp 97.8°F | Resp 14 | Ht 63.0 in | Wt 129.8 lb

## 2014-10-31 DIAGNOSIS — Z23 Encounter for immunization: Secondary | ICD-10-CM

## 2014-10-31 DIAGNOSIS — I119 Hypertensive heart disease without heart failure: Secondary | ICD-10-CM

## 2014-10-31 DIAGNOSIS — E785 Hyperlipidemia, unspecified: Secondary | ICD-10-CM

## 2014-10-31 DIAGNOSIS — I6523 Occlusion and stenosis of bilateral carotid arteries: Secondary | ICD-10-CM

## 2014-10-31 DIAGNOSIS — E119 Type 2 diabetes mellitus without complications: Secondary | ICD-10-CM

## 2014-10-31 DIAGNOSIS — Z8639 Personal history of other endocrine, nutritional and metabolic disease: Secondary | ICD-10-CM

## 2014-10-31 LAB — COMPREHENSIVE METABOLIC PANEL
ALK PHOS: 70 U/L (ref 39–117)
ALT: 14 U/L (ref 0–35)
AST: 29 U/L (ref 0–37)
Albumin: 4.2 g/dL (ref 3.5–5.2)
BILIRUBIN TOTAL: 0.6 mg/dL (ref 0.2–1.2)
BUN: 15 mg/dL (ref 6–23)
CALCIUM: 9.3 mg/dL (ref 8.4–10.5)
CO2: 28 meq/L (ref 19–32)
Chloride: 100 mEq/L (ref 96–112)
Creatinine, Ser: 1.12 mg/dL (ref 0.40–1.20)
GFR: 58.9 mL/min — AB (ref >60.00)
Glucose, Bld: 125 mg/dL — ABNORMAL HIGH (ref 70–99)
POTASSIUM: 3.9 meq/L (ref 3.5–5.1)
Sodium: 136 mEq/L (ref 135–145)
Total Protein: 7.9 g/dL (ref 6.0–8.3)

## 2014-10-31 LAB — LIPID PANEL
CHOL/HDL RATIO: 3
Cholesterol: 132 mg/dL (ref 0–200)
HDL: 45.2 mg/dL (ref >39.00)
LDL Cholesterol: 69 mg/dL (ref 0–99)
NONHDL: 87.17
TRIGLYCERIDES: 89 mg/dL (ref 0.0–149.0)
VLDL: 17.8 mg/dL (ref 0.0–40.0)

## 2014-10-31 LAB — HEMOGLOBIN A1C: Hgb A1c MFr Bld: 6 % (ref 4.6–6.5)

## 2014-10-31 MED ORDER — PROMETHAZINE-CODEINE 6.25-10 MG/5ML PO SYRP: 5.0000 mL | ORAL_SOLUTION | ORAL | Status: DC | PRN

## 2014-10-31 MED ORDER — LEVOTHYROXINE SODIUM 50 MCG PO TABS: 50.0000 ug | ORAL_TABLET | Freq: Every day | ORAL | Status: DC

## 2014-10-31 NOTE — Assessment & Plan Note (Signed)
Checking lipid panel today and forward to her cardiologist. On lipitor 20 mg daily and no side effects.

## 2014-10-31 NOTE — Progress Notes (Signed)
   Subjective:    Patient ID: Sydney Moore, female    DOB: Aug 19, 1923, 81 y.o.   MRN: [id number]  HPI The patient is an 81 YO female coming in for follow up of her many chronic medical problems. She is coming in for her diabetes (unclear if this is true diagnosis, per her reports metformin was stopped about 1 year ago, no HgA1c in the last 8 years that meet criteria for diabetes, no complications known, is on ARB and statin), her blood pressure (BP at goal on losartan, amlodipine, lasix, imdur, metoprolol), and her cholesterol (taking lipitor 20 mg without side effect, complicated by CAD). No new complaints today. Doing well overall. No falls, no medication changes. No side effects from medication. Mild constipation which she manages with diet.   Review of Systems  Constitutional: Negative for fever, chills, activity change, appetite change, fatigue and unexpected weight change.  HENT: Negative.   Eyes: Negative.   Respiratory: Negative for cough, chest tightness, shortness of breath and wheezing.   Cardiovascular: Negative for chest pain, palpitations and leg swelling.  Gastrointestinal: Positive for constipation. Negative for nausea, vomiting, abdominal pain, diarrhea and abdominal distention.       Mild, managed with diet  Musculoskeletal: Positive for arthralgias. Negative for back pain and gait problem.  Skin: Negative.   Neurological: Negative for dizziness, seizures, speech difficulty, weakness, numbness and headaches.  Psychiatric/Behavioral: Negative.       Objective:   Physical Exam  Constitutional: She is oriented to person, place, and time. She appears well-developed and well-nourished.  HENT:  Head: Normocephalic and atraumatic.  Eyes: EOM are normal.  Neck: Normal range of motion.  Cardiovascular: Normal rate and regular rhythm.   Pulmonary/Chest: Effort normal and breath sounds normal. No respiratory distress. She has no wheezes. She has no rales.  Abdominal: Soft. Bowel  sounds are normal. She exhibits no distension. There is no tenderness. There is no rebound.  Genitourinary: Guaiac negative stool.  Musculoskeletal: She exhibits no edema.  Neurological: She is alert and oriented to person, place, and time. Coordination normal.  Skin: Skin is warm and dry.  See foot exam  Psychiatric: She has a normal mood and affect.   Filed Vitals:   10/31/14 0814  BP: 136/66  Pulse: 84  Temp: 97.8 F (36.6 C)  TempSrc: Oral  Resp: 14  Height: 5\' 3"  (1.6 m)  Weight: 129 lb 12.8 oz (58.877 kg)  SpO2: 97%      Assessment & Plan:  Prevnar 13 and flu shot given at visit.

## 2014-10-31 NOTE — Telephone Encounter (Signed)
Receive call pt states she is needing refill on her levothyroxine. Verified pharmacy inform will send to [facility]...Raechel Chute

## 2014-10-31 NOTE — Assessment & Plan Note (Signed)
BP at goal on her amlodipine and metoprolol and lasix and imdur. Checking labs today and adjust as needed.

## 2014-10-31 NOTE — Patient Instructions (Addendum)
We have given you the pneumonia booster shot today and the flu shot.   We have refilled the cough syrup today and will check the blood work. We will forward those results to Dr. Acie Fredrickson so you do not have to go over there to do blood work.   Health Maintenance Adopting a healthy lifestyle and getting preventive care can go a long way to promote health and wellness. Talk with your health care provider about what schedule of regular examinations is right for you. This is a good chance for you to check in with your provider about disease prevention and staying healthy. In between checkups, there are plenty of things you can do on your own. Experts have done a lot of research about which lifestyle changes and preventive measures are most likely to keep you healthy. Ask your health care provider for more information. WEIGHT AND DIET  Eat a healthy diet  Be sure to include plenty of vegetables, fruits, low-fat dairy products, and lean protein.  Do not eat a lot of foods high in solid fats, added sugars, or salt.  Get regular exercise. This is one of the most important things you can do for your health.  Most adults should exercise for at least 150 minutes each week. The exercise should increase your heart rate and make you sweat (moderate-intensity exercise).  Most adults should also do strengthening exercises at least twice a week. This is in addition to the moderate-intensity exercise.  Maintain a healthy weight  Body mass index (BMI) is a measurement that can be used to identify possible weight problems. It estimates body fat based on height and weight. Your health care provider can help determine your BMI and help you achieve or maintain a healthy weight.  For females 33 years of age and older:   A BMI below 18.5 is considered underweight.  A BMI of 18.5 to 24.9 is normal.  A BMI of 25 to 29.9 is considered overweight.  A BMI of 30 and above is considered obese.  Watch levels of  cholesterol and blood lipids  You should start having your blood tested for lipids and cholesterol at 79 years of age, then have this test every 5 years.  You may need to have your cholesterol levels checked more often if:  Your lipid or cholesterol levels are high.  You are older than 79 years of age.  You are at high risk for heart disease.  CANCER SCREENING   Lung Cancer  Lung cancer screening is recommended for adults 62-78 years old who are at high risk for lung cancer because of a history of smoking.  A yearly low-dose CT scan of the lungs is recommended for people who:  Currently smoke.  Have quit within the past 15 years.  Have at least a 30-pack-year history of smoking. A pack year is smoking an average of one pack of cigarettes a day for 1 year.  Yearly screening should continue until it has been 15 years since you quit.  Yearly screening should stop if you develop a health problem that would prevent you from having lung cancer treatment.  Breast Cancer  Practice breast self-awareness. This means understanding how your breasts normally appear and feel.  It also means doing regular breast self-exams. Let your health care provider know about any changes, no matter how small.  If you are in your 20s or 30s, you should have a clinical breast exam (CBE) by a health care provider every 1-3 years  as part of a regular health exam.  If you are 65 or older, have a CBE every year. Also consider having a breast X-ray (mammogram) every year.  If you have a family history of breast cancer, talk to your health care provider about genetic screening.  If you are at high risk for breast cancer, talk to your health care provider about having an MRI and a mammogram every year.  Breast cancer gene (BRCA) assessment is recommended for women who have family members with BRCA-related cancers. BRCA-related cancers include:  Breast.  Ovarian.  Tubal.  Peritoneal  cancers.  Results of the assessment will determine the need for genetic counseling and BRCA1 and BRCA2 testing. Cervical Cancer Routine pelvic examinations to screen for cervical cancer are no longer recommended for nonpregnant women who are considered low risk for cancer of the pelvic organs (ovaries, uterus, and vagina) and who do not have symptoms. A pelvic examination may be necessary if you have symptoms including those associated with pelvic infections. Ask your health care provider if a screening pelvic exam is right for you.   The Pap test is the screening test for cervical cancer for women who are considered at risk.  If you had a hysterectomy for a problem that was not cancer or a condition that could lead to cancer, then you no longer need Pap tests.  If you are older than 65 years, and you have had normal Pap tests for the past 10 years, you no longer need to have Pap tests.  If you have had past treatment for cervical cancer or a condition that could lead to cancer, you need Pap tests and screening for cancer for at least 20 years after your treatment.  If you no longer get a Pap test, assess your risk factors if they change (such as having a new sexual partner). This can affect whether you should start being screened again.  Some women have medical problems that increase their chance of getting cervical cancer. If this is the case for you, your health care provider may recommend more frequent screening and Pap tests.  The human papillomavirus (HPV) test is another test that may be used for cervical cancer screening. The HPV test looks for the virus that can cause cell changes in the cervix. The cells collected during the Pap test can be tested for HPV.  The HPV test can be used to screen women 64 years of age and older. Getting tested for HPV can extend the interval between normal Pap tests from three to five years.  An HPV test also should be used to screen women of any age who  have unclear Pap test results.  After 79 years of age, women should have HPV testing as often as Pap tests.  Colorectal Cancer  This type of cancer can be detected and often prevented.  Routine colorectal cancer screening usually begins at 79 years of age and continues through 79 years of age.  Your health care provider may recommend screening at an earlier age if you have risk factors for colon cancer.  Your health care provider may also recommend using home test kits to check for hidden blood in the stool.  A small camera at the end of a tube can be used to examine your colon directly (sigmoidoscopy or colonoscopy). This is done to check for the earliest forms of colorectal cancer.  Routine screening usually begins at age 57.  Direct examination of the colon should be repeated every  5-10 years through 79 years of age. However, you may need to be screened more often if early forms of precancerous polyps or small growths are found. Skin Cancer  Check your skin from head to toe regularly.  Tell your health care provider about any new moles or changes in moles, especially if there is a change in a mole's shape or color.  Also tell your health care provider if you have a mole that is larger than the size of a pencil eraser.  Always use sunscreen. Apply sunscreen liberally and repeatedly throughout the day.  Protect yourself by wearing long sleeves, pants, a wide-brimmed hat, and sunglasses whenever you are outside. HEART DISEASE, DIABETES, AND HIGH BLOOD PRESSURE   Have your blood pressure checked at least every 1-2 years. High blood pressure causes heart disease and increases the risk of stroke.  If you are between 86 years and 74 years old, ask your health care provider if you should take aspirin to prevent strokes.  Have regular diabetes screenings. This involves taking a blood sample to check your fasting blood sugar level.  If you are at a normal weight and have a low risk for  diabetes, have this test once every three years after 79 years of age.  If you are overweight and have a high risk for diabetes, consider being tested at a younger age or more often. PREVENTING INFECTION  Hepatitis B  If you have a higher risk for hepatitis B, you should be screened for this virus. You are considered at high risk for hepatitis B if:  You were born in a country where hepatitis B is common. Ask your health care provider which countries are considered high risk.  Your parents were born in a high-risk country, and you have not been immunized against hepatitis B (hepatitis B vaccine).  You have HIV or AIDS.  You use needles to inject street drugs.  You live with someone who has hepatitis B.  You have had sex with someone who has hepatitis B.  You get hemodialysis treatment.  You take certain medicines for conditions, including cancer, organ transplantation, and autoimmune conditions. Hepatitis C  Blood testing is recommended for:  Everyone born from [birth year] through 1963.  Anyone with known risk factors for hepatitis C. Sexually transmitted infections (STIs)  You should be screened for sexually transmitted infections (STIs) including gonorrhea and chlamydia if:  You are sexually active and are younger than 79 years of age.  You are older than 79 years of age and your health care provider tells you that you are at risk for this type of infection.  Your sexual activity has changed since you were last screened and you are at an increased risk for chlamydia or gonorrhea. Ask your health care provider if you are at risk.  If you do not have HIV, but are at risk, it may be recommended that you take a prescription medicine daily to prevent HIV infection. This is called pre-exposure prophylaxis (PrEP). You are considered at risk if:  You are sexually active and do not regularly use condoms or know the HIV status of your partner(s).  You take drugs by injection.  You are  sexually active with a partner who has HIV. Talk with your health care provider about whether you are at high risk of being infected with HIV. If you choose to begin PrEP, you should first be tested for HIV. You should then be tested every 3 months for as long as you  are taking PrEP.  PREGNANCY   If you are premenopausal and you may become pregnant, ask your health care provider about preconception counseling.  If you may become pregnant, take 400 to 800 micrograms (mcg) of folic acid every day.  If you want to prevent pregnancy, talk to your health care provider about birth control (contraception). OSTEOPOROSIS AND MENOPAUSE   Osteoporosis is a disease in which the bones lose minerals and strength with aging. This can result in serious bone fractures. Your risk for osteoporosis can be identified using a bone density scan.  If you are 66 years of age or older, or if you are at risk for osteoporosis and fractures, ask your health care provider if you should be screened.  Ask your health care provider whether you should take a calcium or vitamin D supplement to lower your risk for osteoporosis.  Menopause may have certain physical symptoms and risks.  Hormone replacement therapy may reduce some of these symptoms and risks. Talk to your health care provider about whether hormone replacement therapy is right for you.  HOME CARE INSTRUCTIONS   Schedule regular health, dental, and eye exams.  Stay current with your immunizations.   Do not use any tobacco products including cigarettes, chewing tobacco, or electronic cigarettes.  If you are pregnant, do not drink alcohol.  If you are breastfeeding, limit how much and how often you drink alcohol.  Limit alcohol intake to no more than 1 drink per day for nonpregnant women. One drink equals 12 ounces of beer, 5 ounces of wine, or 1 ounces of hard liquor.  Do not use street drugs.  Do not share needles.  Ask your health care provider for  help if you need support or information about quitting drugs.  Tell your health care provider if you often feel depressed.  Tell your health care provider if you have ever been abused or do not feel safe at home. Document Released: 08/12/2010 Document Revised: 06/13/2013 Document Reviewed: 12/29/2012 [facility] Patient Information 2015 [place], [place]. This information is not intended to replace advice given to you by your health care provider. Make sure you discuss any questions you have with your health care provider.

## 2014-10-31 NOTE — Progress Notes (Signed)
Pre visit review using our clinic review tool, if applicable. No additional management support is needed unless otherwise documented below in the visit note. 

## 2014-10-31 NOTE — Assessment & Plan Note (Signed)
She has been on metformin in the past but no HgA1c values that are even qualified for diabetes in the last 8 years. Unclear if she still qualifies for diabetes. Foot exam done today. HgA1c checked and microalbumin to creatinine ratio done today. On losartan and lipitor and low dose aspirin. No complications.

## 2014-11-29 ENCOUNTER — Encounter: Payer: Self-pay | Admitting: *Deleted

## 2014-12-04 ENCOUNTER — Encounter: Payer: Self-pay | Admitting: Nurse Practitioner

## 2014-12-04 ENCOUNTER — Ambulatory Visit (INDEPENDENT_AMBULATORY_CARE_PROVIDER_SITE_OTHER): Payer: Medicare Other | Admitting: Nurse Practitioner

## 2014-12-04 VITALS — BP 148/60 | HR 83 | Ht 63.0 in | Wt 130.0 lb

## 2014-12-04 DIAGNOSIS — I10 Essential (primary) hypertension: Secondary | ICD-10-CM

## 2014-12-04 DIAGNOSIS — I442 Atrioventricular block, complete: Secondary | ICD-10-CM

## 2014-12-04 DIAGNOSIS — I208 Other forms of angina pectoris: Secondary | ICD-10-CM | POA: Diagnosis not present

## 2014-12-04 LAB — CUP PACEART INCLINIC DEVICE CHECK
Battery Voltage: 2.79 V
Brady Statistic AP VP Percent: 98.9 %
Brady Statistic AP VS Percent: 0.1 %
Brady Statistic AS VP Percent: 1.1 %
Brady Statistic AS VS Percent: 0.1 %
Date Time Interrogation Session: 20161024130143
Implantable Lead Implant Date: 20040716
Implantable Lead Implant Date: 20040716
Implantable Lead Location: 753859
Implantable Lead Serial Number: 431789
Lead Channel Impedance Value: 452 Ohm
Lead Channel Impedance Value: 637 Ohm
Lead Channel Sensing Intrinsic Amplitude: 1.4 mV
Lead Channel Setting Pacing Amplitude: 2 V
Lead Channel Setting Pacing Amplitude: 2.5 V
Lead Channel Setting Pacing Pulse Width: 0.4 ms
MDC IDC LEAD LOCATION: 753860
MDC IDC LEAD SERIAL: 439074
MDC IDC MSMT LEADCHNL RA PACING THRESHOLD AMPLITUDE: 1 V
MDC IDC MSMT LEADCHNL RA PACING THRESHOLD PULSEWIDTH: 0.4 ms
MDC IDC MSMT LEADCHNL RV PACING THRESHOLD AMPLITUDE: 0.75 V
MDC IDC MSMT LEADCHNL RV PACING THRESHOLD PULSEWIDTH: 0.4 ms
MDC IDC SET LEADCHNL RV SENSING SENSITIVITY: 4 mV

## 2014-12-04 NOTE — Patient Instructions (Addendum)
Medication Instructions: Your physician recommends that you continue on your current medications as directed. Please refer to the Current Medication list given to you today.    Labwork: NONE ORDER TODAY    Testing/Procedures: NONE ORDER TODAY   Follow-Up:  Remote monitoring is used to monitor your Pacemaker of ICD from home. This monitoring reduces the number of office visits required to check your device to one time per year. It allows [place] to keep an eye on the functioning of your device to ensure it is working properly. You are scheduled for a device check from home on . 1/ 24/16  You may send your transmission at any time that day. If you have a wireless device, the transmission will be sent automatically. After your physician reviews your transmission, you will receive a postcard with your next transmission date.  Your physician wants you to follow-up in: ONE YEAR WITH  AMBER SEILER You will receive a reminder letter in the mail two months in advance. If you don't receive a letter, please call our office to schedule the follow-up appointment.     Any Other Special Instructions Will Be Listed Below (If Applicable).

## 2014-12-04 NOTE — Progress Notes (Signed)
Electrophysiology Office Note Date: 12/04/2014  ID:  Sydney, Moore 13-Apr-1923, MRN [id number]  PCP: Rene Paci, MD Primary Cardiologist: Nahser Electrophysiologist: Allred  CC: Pacemaker follow-up  Sydney Moore is a 81 y.o. female seen today for Dr Johney Frame.  She presents today for routine electrophysiology followup.  Since last being seen in our clinic, the patient reports doing very well.  She has stable angina but denies palpitations, dyspnea, PND, orthopnea, nausea, vomiting, dizziness, syncope, edema, weight gain, or early satiety.  Device History: MDT dual chamber PPM implanted 2004 for CHB, gen change 2012   Past Medical History  Diagnosis Date  . Hypertension   . Hyperlipidemia   . Diabetes mellitus   . Chronic cough     Seen by Dr. Sherene Sires  . Hypothyroid   . CAD (coronary artery disease) 1989    a. 1989: s/p CABGx3;  b. 06/2007 Cath/Attempted PCI: LM min irregs, LAD 90/100, D1 90 (attempted PCI), RI 90, LCX100p, OM1 100, RCA 60/70p, 90/43m, VG->RCA nl, VG->OM 100, LIMA->LAD nl, EF 50%.  . Complete heart block (HCC)     a. s/p PPM (MDT) by Dr Deborah Chalk 2004; b. Gen change 09/2010 (MDT ADDRL1 Adapta DC PPM Ser #:[id number]  . Carotid artery occlusion 2011    60-79%right/ 40-59% left  . GERD (gastroesophageal reflux disease)   . Chronic systolic CHF (congestive heart failure) (HCC)     a. 03/2011 Echo: EF 40% inflat HK, Gr1 DD, mod MR/TR  . Chest pain March 2014   Past Surgical History  Procedure Laterality Date  . Coronary artery bypass graft  1989  . Pacemaker placement  2004    MDT by Dr Deborah Chalk  . Cataract extraction  2004    Both  . Cardiac catheterization  2004, 2007, 2009    Current Outpatient Prescriptions  Medication Sig Dispense Refill  . amLODipine (NORVASC) 5 MG tablet Take 1 tablet (5 mg total) by mouth daily. 90 tablet 3  . aspirin 81 MG tablet Take 1 tablet (81 mg total) by mouth every morning. 90 tablet 1  . atorvastatin (LIPITOR) 20 MG  tablet Take 1 tablet (20 mg total) by mouth daily. 90 tablet 3  . calcium-vitamin D (OSCAL WITH D) 500-200 MG-UNIT per tablet Take 1 tablet by mouth daily.      . clopidogrel (PLAVIX) 75 MG tablet Take 1 tablet (75 mg total) by mouth daily. 90 tablet 3  . furosemide (LASIX) 40 MG tablet take 1 tablet by mouth once daily 90 tablet 3  . isosorbide mononitrate (IMDUR) 120 MG 24 hr tablet Take 1 tablet (120 mg total) by mouth daily. 90 tablet 3  . levothyroxine (SYNTHROID, LEVOTHROID) 50 MCG tablet Take 1 tablet (50 mcg total) by mouth daily. 90 tablet 1  . losartan (COZAAR) 50 MG tablet Take half (25 mg total) to one (1) tablet (50 mg total) by mouth daily as needed depending on BP.    . meclizine (ANTIVERT) 12.5 MG tablet Take 1 tablet (12.5 mg total) by mouth as needed for dizziness (up to three times a day). 30 tablet 0  . metoprolol succinate (TOPROL-XL) 100 MG 24 hr tablet Take 1 tablet (100 mg total) by mouth daily. Take with or immediately following a meal. 90 tablet 3  . MYRBETRIQ 25 MG TB24 tablet Take 25 mg by mouth daily. Pt not taking right now  1  . nitroGLYCERIN (NITROLINGUAL) 0.4 MG/SPRAY spray Place 1 spray under the tongue every 5 (five) minutes  x 3 doses as needed for chest pain. 12 g 3  . potassium chloride SA (K-DUR,KLOR-CON) 20 MEQ tablet Take 1 tablet (20 mEq total) by mouth every evening. 90 tablet 3  . promethazine-codeine (PHENERGAN WITH CODEINE) 6.25-10 MG/5ML syrup Take 5 mLs by mouth every 4 (four) hours as needed for cough. 118 mL 0  . ranolazine (RANEXA) 1000 MG SR tablet Take 1 tablet (1,000 mg total) by mouth 2 (two) times daily. 180 tablet 3   No current facility-administered medications for this visit.    Allergies:   Review of patient's allergies indicates no known allergies.   Social History: Social History   Social History  . Marital Status: Widowed    Spouse Name: N/A  . Number of Children: 2  . Years of Education: N/A   Occupational History  .  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 Smoking status: Former Smoker    Quit date: 12/15/1973  . Smokeless tobacco: Never Used  . Alcohol Use: No  . Drug Use: No  . Sexual Activity: No   Other Topics Concern  . Not on file   Social History Narrative   Health Care POA: son, Sydney Moore   Emergency Contact: son, Sydney Moore [phone]   End of Life Plan:    Who lives with you: Self   Any pets: none   Diet: Patient has a varied diet of protein, starch, vegetables, and fruit   Exercise: Patient does group exercises 2x week and walks 2x week.   Seatbelts: Patient reports wearing seatbelt when in vehicle.    Sun Exposure/Protection: Patient reports not using sun protection.   Hobbies: Church, watching tv, walking          Family History: Family History  Problem Relation Age of Onset  . Hypertension Mother   . Liver cancer Brother   . Breast cancer Daughter   . Colon cancer Brother   . Cancer Brother     colon     Review of Systems: All other systems reviewed and are otherwise negative except as noted above.   Physical Exam: VS:  BP 148/60 mmHg  Pulse 83  Ht 5\' 3"  (1.6 m)  Wt 130 lb (58.968 kg)  BMI 23.03 kg/m2  SpO2 97% , BMI Body mass index is 23.03 kg/(m^2).  GEN- The patient is elderly appearing, alert and oriented x 3 today.   HEENT: normocephalic, atraumatic; sclera clear, conjunctiva pink; hearing intact; oropharynx clear; neck supple  Lungs- Clear to ausculation bilaterally, normal work of breathing.  No wheezes, rales, rhonchi Heart- Regular rate and rhythm (paced) GI- soft, non-tender, non-distended, bowel sounds present  Extremities- no clubbing, cyanosis, or edema; DP/PT/radial pulses 2+ bilaterally MS- no significant deformity or atrophy Skin- warm and dry, no rash or lesion; PPM pocket well healed Psych- euthymic mood, full affect Neuro- strength and sensation are intact  PPM Interrogation- reviewed in detail  today,  See PACEART report  EKG:  EKG is not ordered today.    Recent Labs: 10/31/2014: ALT 14; BUN 15; Creatinine, Ser 1.12; Potassium 3.9; Sodium 136   Wt Readings from Last 3 Encounters:  12/04/14 130 lb (58.968 kg)  10/31/14 129 lb 12.8 oz (58.877 kg)  09/18/14 132 lb (59.875 kg)     Other studies Reviewed: Additional studies/ records that were reviewed today include: Dr Johney Frame and Dr Harvie Bridge office notes  Assessment and Plan:  1.  Complete heart block Normal PPM function See Arita Miss  Art report No changes today  2.  Stable angina No change in symptoms over the last several months Continue medical therapy Follow up with Dr Elease Hashimoto as scheduled  3.  HTN Stable No change required today   Current medicines are reviewed at length with the patient today.   The patient does not have concerns regarding her medicines.  The following changes were made today:  none  Labs/ tests ordered today include: none  No orders of the defined types were placed in this encounter.     Disposition:   Follow up with [facility] transmissions, me in 1 year   Signed, Gypsy Balsam, NP 12/04/2014 11:46 AM  [facility] [address] [place] [address] [phone] (office) [phone] (fax)

## 2014-12-21 ENCOUNTER — Encounter: Payer: Self-pay | Admitting: Internal Medicine

## 2015-01-01 ENCOUNTER — Encounter: Payer: Self-pay | Admitting: Internal Medicine

## 2015-01-01 ENCOUNTER — Other Ambulatory Visit (INDEPENDENT_AMBULATORY_CARE_PROVIDER_SITE_OTHER): Payer: Medicare Other

## 2015-01-01 ENCOUNTER — Ambulatory Visit (INDEPENDENT_AMBULATORY_CARE_PROVIDER_SITE_OTHER): Payer: Medicare Other | Admitting: Internal Medicine

## 2015-01-01 VITALS — BP 130/60 | HR 69 | Temp 97.4°F | Ht 63.0 in | Wt 129.8 lb

## 2015-01-01 DIAGNOSIS — E039 Hypothyroidism, unspecified: Secondary | ICD-10-CM | POA: Diagnosis not present

## 2015-01-01 DIAGNOSIS — H9193 Unspecified hearing loss, bilateral: Secondary | ICD-10-CM

## 2015-01-01 DIAGNOSIS — I779 Disorder of arteries and arterioles, unspecified: Secondary | ICD-10-CM

## 2015-01-01 DIAGNOSIS — I200 Unstable angina: Secondary | ICD-10-CM | POA: Diagnosis not present

## 2015-01-01 DIAGNOSIS — E119 Type 2 diabetes mellitus without complications: Secondary | ICD-10-CM

## 2015-01-01 DIAGNOSIS — I739 Peripheral vascular disease, unspecified: Secondary | ICD-10-CM

## 2015-01-01 DIAGNOSIS — Z0000 Encounter for general adult medical examination without abnormal findings: Secondary | ICD-10-CM

## 2015-01-01 LAB — HEPATIC FUNCTION PANEL
ALBUMIN: 4.2 g/dL (ref 3.5–5.2)
ALK PHOS: 77 U/L (ref 39–117)
ALT: 11 U/L (ref 0–35)
AST: 23 U/L (ref 0–37)
BILIRUBIN DIRECT: 0.2 mg/dL (ref 0.0–0.3)
TOTAL PROTEIN: 7.9 g/dL (ref 6.0–8.3)
Total Bilirubin: 0.6 mg/dL (ref 0.2–1.2)

## 2015-01-01 LAB — MICROALBUMIN / CREATININE URINE RATIO
Creatinine,U: 30.9 mg/dL
MICROALB UR: 1.3 mg/dL (ref 0.0–1.9)
Microalb Creat Ratio: 4.2 mg/g (ref 0.0–30.0)

## 2015-01-01 LAB — TSH: TSH: 3.64 u[IU]/mL (ref 0.35–4.50)

## 2015-01-01 LAB — HEMOGLOBIN A1C: Hgb A1c MFr Bld: 6 % (ref 4.6–6.5)

## 2015-01-01 LAB — BASIC METABOLIC PANEL
BUN: 18 mg/dL (ref 6–23)
CHLORIDE: 100 meq/L (ref 96–112)
CO2: 30 meq/L (ref 19–32)
Calcium: 9.6 mg/dL (ref 8.4–10.5)
Creatinine, Ser: 1.15 mg/dL (ref 0.40–1.20)
GFR: 57.11 mL/min — ABNORMAL LOW (ref >60.00)
Glucose, Bld: 90 mg/dL (ref 70–99)
Potassium: 4.2 mEq/L (ref 3.5–5.1)
SODIUM: 137 meq/L (ref 135–145)

## 2015-01-01 MED ORDER — FUROSEMIDE 40 MG PO TABS: 40.0000 mg | ORAL_TABLET | Freq: Every day | ORAL | Status: DC

## 2015-01-01 NOTE — Assessment & Plan Note (Addendum)
Lab Results  Component Value Date   HGBA1C 6.0 10/31/2014   On low dose metformin - intermittent use of same because of diarrhea side effects Stopped same in 2015 due to ?hypoglycemia (cbg 80s) causing dizziness Consider Januvia or other class of medication if a1c > 7 in future On statin, aspirin and ARB

## 2015-01-01 NOTE — Progress Notes (Signed)
Pre visit review using our clinic review tool, if applicable. No additional management support is needed unless otherwise documented below in the visit note. 

## 2015-01-01 NOTE — Assessment & Plan Note (Signed)
Follows at [facility] for same - annual carotids reviewed as left carotid bruit unchanged no new neuro symptoms Also note PVD LE R>L s/p eval/ABI by vasc cards ([place]) - plan annual follow up ABI continue medical mgmt and call sooner if symptoms

## 2015-01-01 NOTE — Assessment & Plan Note (Signed)
CAD with bypass 1989, subsequent intervention 2009 at last cath New anginal symptoms daily with exertion for the past month On maximal medical management already Check labs, refer for stress test and cardiology follow-up Reminded patient if symptoms unrelieved with nitroglycerin to go to emergency room for further urgent evaluation and treatment

## 2015-01-01 NOTE — Assessment & Plan Note (Signed)
Lab Results  Component Value Date   TSH 3.92 04/11/2013  check TSH and adjust dose as needed The current medical regimen is effective;  continue present plan and medications.

## 2015-01-01 NOTE — Progress Notes (Signed)
Subjective:    Patient ID: Sydney Moore, female    DOB: Sep 13, 1923, 81 y.o.   MRN: [id number]  HPI   Here for medicare wellness  Diet: heart healthy and diabetic Physical activity: sedentary Depression/mood screen: negative Hearing: diminished bilaterally to whispered voice Visual acuity: grossly normal, performs annual eye exam  ADLs: capable Fall risk: none Home safety: good Cognitive evaluation: intact to orientation, naming, recall and repetition EOL planning: adv directives, full code/ I agree  I have personally reviewed and have noted 1. The patient's medical and social history 2. Their use of alcohol, tobacco or illicit drugs 3. Their current medications and supplements 4. The patient's functional ability including ADL's, fall risks, home safety risks and hearing or visual impairment. 5. Diet and physical activities 6. Evidence for depression or mood disorders  Also reviewed chronic medical conditions, interval events and current concerns - specifically new, daily exertional chest pain relieved with NTG spray. associated with fatigue, per dtr pt sleeps constantly    Past Medical History  Diagnosis Date  . Hypertension   . Hyperlipidemia   . Diabetes mellitus   . Chronic cough     Seen by Dr. Sherene Sires  . Hypothyroid   . CAD (coronary artery disease) 1989    a. 1989: s/p CABGx3;  b. 06/2007 Cath/Attempted PCI: LM min irregs, LAD 90/100, D1 90 (attempted PCI), RI 90, LCX100p, OM1 100, RCA 60/70p, 90/76m, VG->RCA nl, VG->OM 100, LIMA->LAD nl, EF 50%.  . Complete heart block (HCC)     a. s/p PPM (MDT) by Dr Deborah Chalk 2004; b. Gen change 09/2010 (MDT ADDRL1 Adapta DC PPM Ser #:[id number]  . Carotid artery occlusion 2011    60-79%right/ 40-59% left  . GERD (gastroesophageal reflux disease)   . Chronic systolic CHF (congestive heart failure) (HCC)     a. 03/2011 Echo: EF 40% inflat HK, Gr1 DD, mod MR/TR  . Chest pain March 2014   Family History  Problem Relation Age of  Onset  . Hypertension Mother   . Liver cancer Brother   . Breast cancer Daughter   . Colon cancer Brother   . Cancer Brother     colon   Social History  Substance Use Topics  . Smoking status: Former Smoker    Quit date: 12/15/1973  . Smokeless tobacco: Never Used  . Alcohol Use: No    Review of Systems  Constitutional: Positive for fatigue. Negative for unexpected weight change.  Respiratory: Negative for cough, shortness of breath and wheezing.   Cardiovascular: Positive for chest pain (with any exertion, releaved with NTG spray). Negative for palpitations and leg swelling.  Gastrointestinal: Negative for nausea, abdominal pain and diarrhea.  Neurological: Negative for dizziness, weakness, light-headedness and headaches.  Psychiatric/Behavioral: Positive for sleep disturbance (hypersomnia per dtr). Negative for dysphoric mood. The patient is not nervous/anxious.   All other systems reviewed and are negative.   Patient Care Team: Newt Lukes, MD as PCP - General (Internal Medicine) Vesta Mixer, MD (Cardiology) Tonny Bollman, MD as Consulting Physician (Cardiology) Nyoka Cowden, MD as Consulting Physician (Pulmonary Disease) Marinus Maw, MD (Cardiology)     Objective:    Physical Exam  Constitutional: She appears well-developed. No distress.  Underweight Dtr at side  HENT:  Head: Normocephalic and atraumatic.  Right Ear: External ear normal.  Left Ear: External ear normal.  Mouth/Throat: No oropharyngeal exudate.  No cerumen  Cardiovascular: Normal rate, regular rhythm and normal heart sounds.   No murmur  heard. Pulmonary/Chest: Effort normal and breath sounds normal. No respiratory distress.  Musculoskeletal: She exhibits no edema.    BP 130/60 mmHg  Pulse 69  Temp(Src) 97.4 F (36.3 C) (Oral)  Ht  (1.6 m)  Wt 129 lb 12 oz (58.854 kg)  BMI 22.99 kg/m2  SpO2 95% Wt Readings from Last 3 Encounters:  01/01/15 129 lb 12 oz (58.854 kg)    12/04/14 130 lb (58.968 kg)  10/31/14 129 lb 12.8 oz (58.877 kg)    Lab Results  Component Value Date   WBC 5.4 04/11/2013   HGB 11.6* 04/11/2013   HCT 34.8* 04/11/2013   PLT 275.0 04/11/2013   GLUCOSE 125* 10/31/2014   CHOL 132 10/31/2014   TRIG 89.0 10/31/2014   HDL 45.20 10/31/2014   LDLDIRECT 58 12/18/2008   LDLCALC 69 10/31/2014   ALT 14 10/31/2014   AST 29 10/31/2014   NA 136 10/31/2014   K 3.9 10/31/2014   CL 100 10/31/2014   CREATININE 1.12 10/31/2014   BUN 15 10/31/2014   CO2 28 10/31/2014   TSH 3.92 04/11/2013   INR 1.02 04/28/2012   HGBA1C 6.0 10/31/2014   MICROALBUR 1.0 10/08/2012    No results found.     Assessment & Plan:   AWV/z00.00 - Today patient counseled on age appropriate routine health concerns for screening and prevention, each reviewed and up to date or declined. Immunizations reviewed and up to date or declined. Labs ordered and reviewed. Risk factors for depression reviewed and negative. Hearing function and visual acuity are intact. ADLs screened and addressed as needed. Functional ability and level of safety reviewed and appropriate. Education, counseling and referrals performed based on assessed risks today. Patient provided with a copy of personalized plan for preventive services.  Fatigue, other - nonspecific symptoms/exam - check screening labs and eval angina - see below   Problem List Items Addressed This Visit    Carotid artery disease (HCC)    Follows at [facility] for same - annual carotids reviewed as left carotid bruit unchanged no new neuro symptoms Also note PVD LE R>L s/p eval/ABI by vasc cards ([place]) - plan annual follow up ABI continue medical mgmt and call sooner if symptoms       Relevant Medications   losartan (COZAAR) 50 MG tablet   furosemide (LASIX) 40 MG tablet   Diet-controlled diabetes mellitus (HCC)    Lab Results  Component Value Date   HGBA1C 6.0 10/31/2014   On low dose metformin - intermittent use of same  because of diarrhea side effects Stopped same in 2015 due to ?hypoglycemia (cbg 80s) causing dizziness Consider Januvia or other class of medication if a1c > 7 in future On statin, aspirin and ARB      Relevant Medications   losartan (COZAAR) 50 MG tablet   Other Relevant Orders   Hemoglobin A1c   Microalbumin / creatinine urine ratio   Hypothyroidism    Lab Results  Component Value Date   TSH 3.92 04/11/2013  check TSH and adjust dose as needed The current medical regimen is effective;  continue present plan and medications.       Relevant Orders   TSH   Unstable angina (HCC)    CAD with bypass 1989, subsequent intervention 2009 at last cath New anginal symptoms daily with exertion for the past month On maximal medical management already Check labs, refer for stress test and cardiology follow-up Reminded patient if symptoms unrelieved with nitroglycerin to go to emergency room  for further urgent evaluation and treatment      Relevant Medications   losartan (COZAAR) 50 MG tablet   furosemide (LASIX) 40 MG tablet   Other Relevant Orders   Basic metabolic panel   CBC with Differential/Platelet   Hepatic function panel   Ambulatory referral to Cardiology   Myocardial Perfusion Imaging    Other Visit Diagnoses    Routine general medical examination at a health care facility    -  Primary        Rene Paci, MD

## 2015-01-01 NOTE — Patient Instructions (Signed)
It was good to see you today.  We have reviewed your prior records including labs and tests today  Test(s) ordered today. Your results will be released to MyChart (or called to you) after review, usually within 72hours after test completion. If any changes need to be made, you will be notified at that same time.  Medications reviewed and updated, no changes recommended at this time.  we'll make referral to cardiology for evaluation and for stress test. Our office will contact you regarding appointment(s) once made.  We'll also schedule evaluation for hearing aids as discussed  Please keep scheduled followup with Dr Okey Dupre as planned, call sooner if problems.

## 2015-01-02 LAB — CBC WITH DIFFERENTIAL/PLATELET
BASOS ABS: 0 10*3/uL (ref 0.0–0.1)
BASOS PCT: 0.5 % (ref 0.0–3.0)
EOS ABS: 0.1 10*3/uL (ref 0.0–0.7)
Eosinophils Relative: 2.1 % (ref 0.0–5.0)
HEMATOCRIT: 37 % (ref 36.0–46.0)
HEMOGLOBIN: 12.4 g/dL (ref 12.0–15.0)
LYMPHS PCT: 31.5 % (ref 12.0–46.0)
Lymphs Abs: 1.4 10*3/uL (ref 0.7–4.0)
MCHC: 33.5 g/dL (ref 30.0–36.0)
MCV: 98 fl (ref 78.0–100.0)
MONOS PCT: 16.8 % — AB (ref 3.0–12.0)
Monocytes Absolute: 0.7 10*3/uL (ref 0.1–1.0)
NEUTROS ABS: 2.1 10*3/uL (ref 1.4–7.7)
Neutrophils Relative %: 49.1 % (ref 43.0–77.0)
PLATELETS: 245 10*3/uL (ref 150.0–400.0)
RBC: 3.78 Mil/uL — ABNORMAL LOW (ref 3.87–5.11)
RDW: 14.6 % (ref 11.5–15.5)
WBC: 4.3 10*3/uL (ref 4.0–10.5)

## 2015-01-15 ENCOUNTER — Ambulatory Visit (INDEPENDENT_AMBULATORY_CARE_PROVIDER_SITE_OTHER): Payer: Medicare Other | Admitting: Cardiovascular Disease

## 2015-01-15 ENCOUNTER — Other Ambulatory Visit (INDEPENDENT_AMBULATORY_CARE_PROVIDER_SITE_OTHER): Payer: Medicare Other | Admitting: *Deleted

## 2015-01-15 ENCOUNTER — Encounter: Payer: Self-pay | Admitting: Cardiovascular Disease

## 2015-01-15 ENCOUNTER — Encounter: Payer: Self-pay | Admitting: Nurse Practitioner

## 2015-01-15 VITALS — BP 130/58 | HR 87 | Ht 63.0 in | Wt 130.0 lb

## 2015-01-15 DIAGNOSIS — I25810 Atherosclerosis of coronary artery bypass graft(s) without angina pectoris: Secondary | ICD-10-CM

## 2015-01-15 DIAGNOSIS — E785 Hyperlipidemia, unspecified: Secondary | ICD-10-CM | POA: Diagnosis not present

## 2015-01-15 DIAGNOSIS — I200 Unstable angina: Secondary | ICD-10-CM | POA: Diagnosis not present

## 2015-01-15 DIAGNOSIS — I25110 Atherosclerotic heart disease of native coronary artery with unstable angina pectoris: Secondary | ICD-10-CM | POA: Diagnosis not present

## 2015-01-15 DIAGNOSIS — I779 Disorder of arteries and arterioles, unspecified: Secondary | ICD-10-CM

## 2015-01-15 DIAGNOSIS — I25700 Atherosclerosis of coronary artery bypass graft(s), unspecified, with unstable angina pectoris: Secondary | ICD-10-CM | POA: Diagnosis not present

## 2015-01-15 DIAGNOSIS — I739 Peripheral vascular disease, unspecified: Secondary | ICD-10-CM

## 2015-01-15 LAB — BASIC METABOLIC PANEL
BUN: 16 mg/dL (ref 7–25)
CALCIUM: 9.3 mg/dL (ref 8.6–10.4)
CO2: 27 mmol/L (ref 20–31)
CREATININE: 1.01 mg/dL — AB (ref 0.60–0.88)
Chloride: 98 mmol/L (ref 98–110)
Glucose, Bld: 101 mg/dL — ABNORMAL HIGH (ref 65–99)
Potassium: 3.7 mmol/L (ref 3.5–5.3)
SODIUM: 136 mmol/L (ref 135–146)

## 2015-01-15 LAB — CBC WITH DIFFERENTIAL/PLATELET
BASOS ABS: 0 10*3/uL (ref 0.0–0.1)
BASOS PCT: 1 % (ref 0–1)
EOS ABS: 0.1 10*3/uL (ref 0.0–0.7)
EOS PCT: 2 % (ref 0–5)
HCT: 31.8 % — ABNORMAL LOW (ref 36.0–46.0)
Hemoglobin: 11.1 g/dL — ABNORMAL LOW (ref 12.0–15.0)
LYMPHS ABS: 1.4 10*3/uL (ref 0.7–4.0)
Lymphocytes Relative: 30 % (ref 12–46)
MCH: 33.4 pg (ref 26.0–34.0)
MCHC: 34.9 g/dL (ref 30.0–36.0)
MCV: 95.8 fL (ref 78.0–100.0)
MONOS PCT: 13 % — AB (ref 3–12)
MPV: 8.2 fL — AB (ref 8.6–12.4)
Monocytes Absolute: 0.6 10*3/uL (ref 0.1–1.0)
NEUTROS PCT: 54 % (ref 43–77)
Neutro Abs: 2.5 10*3/uL (ref 1.7–7.7)
PLATELETS: 227 10*3/uL (ref 150–400)
RBC: 3.32 MIL/uL — ABNORMAL LOW (ref 3.87–5.11)
RDW: 14.2 % (ref 11.5–15.5)
WBC: 4.6 10*3/uL (ref 4.0–10.5)

## 2015-01-15 LAB — PROTIME-INR
INR: 1.07 (ref <1.50)
PROTHROMBIN TIME: 14 s (ref 11.6–15.2)

## 2015-01-15 LAB — HEPATIC FUNCTION PANEL
ALBUMIN: 4 g/dL (ref 3.6–5.1)
ALK PHOS: 68 U/L (ref 33–130)
ALT: 15 U/L (ref 6–29)
AST: 26 U/L (ref 10–35)
Bilirubin, Direct: 0.1 mg/dL (ref <=0.2)
Indirect Bilirubin: 0.4 mg/dL (ref 0.2–1.2)
TOTAL PROTEIN: 7.4 g/dL (ref 6.1–8.1)
Total Bilirubin: 0.5 mg/dL (ref 0.2–1.2)

## 2015-01-15 LAB — LIPID PANEL
CHOL/HDL RATIO: 3.1 ratio (ref <=5.0)
CHOLESTEROL: 128 mg/dL (ref 125–200)
HDL: 41 mg/dL — AB (ref >=46)
LDL Cholesterol: 64 mg/dL (ref <130)
TRIGLYCERIDES: 113 mg/dL (ref <150)
VLDL: 23 mg/dL (ref <30)

## 2015-01-15 NOTE — Patient Instructions (Addendum)
Medication Instructions:  Your physician recommends that you continue on your current medications as directed. Please refer to the Current Medication list given to you today.   Labwork: TODAY - PT/INR, CBC, Basic metabolic panel, Lipid, Liver   Testing/Procedures: Your physician has requested that you have a cardiac catheterization. Cardiac catheterization is used to diagnose and/or treat various heart conditions. Doctors may recommend this procedure for a number of different reasons. The most common reason is to evaluate chest pain. Chest pain can be a symptom of coronary artery disease (CAD), and cardiac catheterization can show whether plaque is narrowing or blocking your heart's arteries. This procedure is also used to evaluate the valves, as well as measure the blood flow and oxygen levels in different parts of your heart. For further information please visit [email or web address] Please follow instruction sheet, as given.   Follow-Up: Your physician recommends that you schedule a follow-up appointment in: 2-3 months with Dr. Elease Hashimoto.    If you need a refill on your cardiac medications before your next appointment, please call your pharmacy.   Thank you for choosing [facility]! Eligha Bridegroom, RN [phone]

## 2015-01-15 NOTE — Progress Notes (Signed)
Sydney Moore Date of Birth  [birth year] [facility] / [facility] [address]     [address] [place], [place]  [address] [phone]  Fax  [phone]   Problems: 1. CAD - s/p CABG 1989  2. Pacer - for advanced AV block 3. PVD- left carotid bruit 4. CHF - EF of 40% 5. Diabetes Mellitus 6. Hypothyroidism 7. Bilateral carotid bruits:    History of Present Illness:  81 year old female with a history of coronary artery disease and pacemaker implant.  She has a history of peripheral vascular disease and has a known left carotid bruit which we are following.   She's had a history of coronary artery bypass grafting. She denies any shortness of breath or chest pain. She does complain of some leg weakness particularly with walking.  Check pacer generator changed in October. She has developed some keloid formation over the pacer incision  A recent echocardiogram reveals mild to moderate left ventricular dysfunction with an ejection fraction of 40%.  There is moderate mitral regurgitation and moderate tricuspid regurgitation. Right ventricle appears to be dilated. Her pulmonary pressures are at the upper limits of normal at 36 mmHg. Her Lasix dose was increased by Dr. Ladona Ridgel and she's feeling a bit better.  Septeber 17, 2013 - she has done well.  She has occasional episodes of CP when walking - goes away very quickly with a SL NTG.  These episodes of chest pain are very rare. She's able to do most of her normal activities without any significant problems. She has known stenosis in her ramus intermediate branch that was not amenable to PCI. We were not able to put a wire down that vessel. At that point she was started on Ranexa and has done well since that time.  June 27 ,2014: Sydney Moore is doing well.  She has occasional CP and takes NTG occasionally.  Feb. 9, 2015: Sydney Moore is doing ok.  She still has some exertional angina relieved with SL NTG.   She is able to  do all of her normal daily activities without significant limitations as long as she takes the nitroglycerin.   She is on Ranexa 1000 BID.    Her BP is high this am - she has not taken her BP pills yet.  April 13, 2013:  Sydney Mccreedy has been admitted to the hospital since I last saw her. She had a stress Myoview study which revealed a fixed apical defect and no ischemia.  Ejection fraction remained moderately reduced at 35%.  The isosorbide was increased to 120 mg a day.  She continues to have some mild chest pain but thinks that the episodes are decreased from before the hospitalization. She does use sublingual nitroglycerin on a daily basis that several times a day infact.   August 09, 2013:  She is doing well.  No significant angina.     Dec. 1, 2015:  Sydney Moore is a 81 y.o. female who I see for CAD, pacer, HTN, and mild chronic systolic CHF. Has some generalized fatigue. Has rare episodes of angina and use NTG spray.   Symptoms seem to be stable . She has requested a handicap sticker.   Aug. 4, 2016:  Still having some angina. Resolves with 1 SL NTG.  No syncope   Dec. 5, 2016  Having more angina .  Takes more NTG.  Relieves the pain .   Current Outpatient Prescriptions on File Prior to Visit  Medication Sig Dispense Refill  .  amLODipine (NORVASC) 5 MG tablet Take 1 tablet (5 mg total) by mouth daily. 90 tablet 3  . aspirin 81 MG tablet Take 1 tablet (81 mg total) by mouth every morning. 90 tablet 1  . atorvastatin (LIPITOR) 20 MG tablet Take 1 tablet (20 mg total) by mouth daily. 90 tablet 3  . calcium-vitamin D (OSCAL WITH D) 500-200 MG-UNIT per tablet Take 1 tablet by mouth daily.      . clopidogrel (PLAVIX) 75 MG tablet Take 1 tablet (75 mg total) by mouth daily. 90 tablet 3  . furosemide (LASIX) 40 MG tablet Take 1 tablet (40 mg total) by mouth daily. 90 tablet 3  . isosorbide mononitrate (IMDUR) 120 MG 24 hr tablet Take 1 tablet (120 mg total) by mouth daily. 90 tablet 3  .  levothyroxine (SYNTHROID, LEVOTHROID) 50 MCG tablet Take 1 tablet (50 mcg total) by mouth daily. 90 tablet 1  . losartan (COZAAR) 50 MG tablet Take 0.5-1 tablets (25-50 mg total) by mouth daily as needed. Takes when SBP>130    . meclizine (ANTIVERT) 12.5 MG tablet Take 1 tablet (12.5 mg total) by mouth as needed for dizziness (up to three times a day). 30 tablet 0  . metoprolol succinate (TOPROL-XL) 100 MG 24 hr tablet Take 1 tablet (100 mg total) by mouth daily. Take with or immediately following a meal. 90 tablet 3  . nitroGLYCERIN (NITROLINGUAL) 0.4 MG/SPRAY spray Place 1 spray under the tongue every 5 (five) minutes x 3 doses as needed for chest pain. 12 g 3  . potassium chloride SA (K-DUR,KLOR-CON) 20 MEQ tablet Take 1 tablet (20 mEq total) by mouth every evening. 90 tablet 3  . promethazine-codeine (PHENERGAN WITH CODEINE) 6.25-10 MG/5ML syrup Take 5 mLs by mouth every 4 (four) hours as needed for cough. 118 mL 0  . ranolazine (RANEXA) 1000 MG SR tablet Take 1 tablet (1,000 mg total) by mouth 2 (two) times daily. 180 tablet 3   No current facility-administered medications on file prior to visit.    No Known Allergies  Past Medical History  Diagnosis Date  . Hypertension   . Hyperlipidemia   . Diabetes mellitus   . Chronic cough     Seen by Dr. Sherene Sires  . Hypothyroid   . CAD (coronary artery disease) 1989    a. 1989: s/p CABGx3;  b. 06/2007 Cath/Attempted PCI: LM min irregs, LAD 90/100, D1 90 (attempted PCI), RI 90, LCX100p, OM1 100, RCA 60/70p, 90/8m, VG->RCA nl, VG->OM 100, LIMA->LAD nl, EF 50%.  . Complete heart block (HCC)     a. s/p PPM (MDT) by Dr Deborah Chalk 2004; b. Gen change 09/2010 (MDT ADDRL1 Adapta DC PPM Ser #:[id number]  . Carotid artery occlusion 2011    60-79%right/ 40-59% left  . GERD (gastroesophageal reflux disease)   . Chronic systolic CHF (congestive heart failure) (HCC)     a. 03/2011 Echo: EF 40% inflat HK, Gr1 DD, mod MR/TR  . Chest pain March 2014    Past  Surgical History  Procedure Laterality Date  . Coronary artery bypass graft  1989  . Pacemaker placement  2004    MDT by Dr Deborah Chalk  . Cataract extraction  2004    Both  . Cardiac catheterization  2004, 2007, 2009    History  Smoking status  . Former Smoker  . Quit date: 12/15/1973  Smokeless tobacco  . Never Used    History  Alcohol Use No    Family History  Problem Relation Age  of Onset  . Hypertension Mother   . Liver cancer Brother   . Breast cancer Daughter   . Colon cancer Brother   . Cancer Brother     colon    Reviw of Systems:  Reviewed in the HPI.  All other systems are negative.  Physical Exam: BP 130/58 mmHg  Pulse 87  Ht 5' 3" (1.6 m)  Wt 130 lb (58.968 kg)  BMI 23.03 kg/m2 The patient is alert and oriented x 3.  The mood and affect are normal.   Skin: warm and dry.  Color is normal.    HEENT:   the sclera are nonicteric.  The mucous membranes are moist.  The carotids are 2+ with soft bruits. .  There is no thyromegaly.  Pulsatile next is inferior to her carotid arteries that are either do to a large V wave or perhaps do to her common carotid artery.  Lungs: clear.  The chest wall is non tender.    Heart: regular rate with a normal S1 and S2.  There is a soft systolic murmur The PMI is not displaced.  Her pacer is in the right subclavian.  Abdomen: good bowel sounds.  There is no guarding or rebound.  There is no hepatosplenomegaly or tenderness.  There are no masses.   Extremities:  no clubbing, cyanosis, or edema.  The legs are without rashes.  The distal pulses are diminished  Neuro:  Cranial nerves II - XII are intact.  Motor and sensory functions are intact.    The gait is normal.  ECG:     Assessment / Plan:   1. CAD - s/p CABG 1989  -  She is having progressive unstable angina.  Has angina with everyday activities. On near maximal medication - Norvasc , metooprolol 100, Imdur 120 a day , ranexa 1000 BID She has known CAD with a tight  stenosis in the graft to RCA .   She may had progressive CAD that explains her angina .  I think we should proceed with cardiac catheterization. We discussed the risks benefits and options concerning cardiac catheterization. She understands and agrees to proceed.  2. Pacer - for advanced AV block 3. PVD-  4. CHF - EF of 40% 5. Diabetes Mellitus - managed by diet now.  6. Hypothyroidism 7. Bilateral carotid bruits:   Stable  8. Hyperlipidemia:  Stable , continue atorvastatin 20 , will check lipids today    Lilyanna Lunt J, MD  01/15/2015 8:17 AM    [facility] [address],  [address] [place], [place]  [address] Pager [phone] Phone: [phone]; Fax: [phone]   [place] Office  [address] [place], [place]  [address] [phone]   Fax [phone]   

## 2015-01-16 ENCOUNTER — Encounter (HOSPITAL_COMMUNITY): Payer: Self-pay | Admitting: Pharmacy Technician

## 2015-01-17 NOTE — Addendum Note (Signed)
Addended by: Vesta Mixer on: 01/17/2015 10:13 AM   Modules accepted: Orders

## 2015-01-18 ENCOUNTER — Encounter (HOSPITAL_COMMUNITY)
Admission: RE | Disposition: A | Discharge status: Home / Self Care | Payer: Self-pay | Source: Ambulatory Visit | Attending: Cardiovascular Disease

## 2015-01-18 ENCOUNTER — Ambulatory Visit (HOSPITAL_COMMUNITY)
Admission: RE | Admit: 2015-01-18 | Discharge: 2015-01-18 | Disposition: A | Discharge status: Home / Self Care | Payer: Medicare Other | Source: Ambulatory Visit | Attending: Cardiovascular Disease | Admitting: Cardiovascular Disease

## 2015-01-18 ENCOUNTER — Other Ambulatory Visit: Payer: Self-pay

## 2015-01-18 DIAGNOSIS — I2582 Chronic total occlusion of coronary artery: Secondary | ICD-10-CM | POA: Insufficient documentation

## 2015-01-18 DIAGNOSIS — I442 Atrioventricular block, complete: Secondary | ICD-10-CM | POA: Diagnosis not present

## 2015-01-18 DIAGNOSIS — I25110 Atherosclerotic heart disease of native coronary artery with unstable angina pectoris: Secondary | ICD-10-CM | POA: Insufficient documentation

## 2015-01-18 DIAGNOSIS — K219 Gastro-esophageal reflux disease without esophagitis: Secondary | ICD-10-CM | POA: Insufficient documentation

## 2015-01-18 DIAGNOSIS — Z950 Presence of cardiac pacemaker: Secondary | ICD-10-CM | POA: Insufficient documentation

## 2015-01-18 DIAGNOSIS — I110 Hypertensive heart disease with heart failure: Secondary | ICD-10-CM | POA: Diagnosis not present

## 2015-01-18 DIAGNOSIS — Z7982 Long term (current) use of aspirin: Secondary | ICD-10-CM | POA: Diagnosis not present

## 2015-01-18 DIAGNOSIS — I6523 Occlusion and stenosis of bilateral carotid arteries: Secondary | ICD-10-CM | POA: Insufficient documentation

## 2015-01-18 DIAGNOSIS — I5022 Chronic systolic (congestive) heart failure: Secondary | ICD-10-CM | POA: Diagnosis not present

## 2015-01-18 DIAGNOSIS — Z87891 Personal history of nicotine dependence: Secondary | ICD-10-CM | POA: Insufficient documentation

## 2015-01-18 DIAGNOSIS — E039 Hypothyroidism, unspecified: Secondary | ICD-10-CM | POA: Diagnosis not present

## 2015-01-18 DIAGNOSIS — Z7902 Long term (current) use of antithrombotics/antiplatelets: Secondary | ICD-10-CM | POA: Diagnosis not present

## 2015-01-18 DIAGNOSIS — Z951 Presence of aortocoronary bypass graft: Secondary | ICD-10-CM | POA: Insufficient documentation

## 2015-01-18 DIAGNOSIS — E785 Hyperlipidemia, unspecified: Secondary | ICD-10-CM | POA: Insufficient documentation

## 2015-01-18 DIAGNOSIS — E1151 Type 2 diabetes mellitus with diabetic peripheral angiopathy without gangrene: Secondary | ICD-10-CM | POA: Diagnosis not present

## 2015-01-18 HISTORY — PX: CARDIAC CATHETERIZATION: SHX172

## 2015-01-18 LAB — GLUCOSE, CAPILLARY
GLUCOSE-CAPILLARY: 108 mg/dL — AB (ref 65–99)
GLUCOSE-CAPILLARY: 92 mg/dL (ref 65–99)

## 2015-01-18 SURGERY — LEFT HEART CATH AND CORS/GRAFTS ANGIOGRAPHY
Anesthesia: LOCAL

## 2015-01-18 MED ORDER — SODIUM CHLORIDE 0.9 % IV SOLN: 250.0000 mL | INTRAVENOUS | Status: DC | PRN

## 2015-01-18 MED ORDER — SODIUM CHLORIDE 0.9 % WEIGHT BASED INFUSION: 1.0000 mL/kg/h | INTRAVENOUS | Status: DC

## 2015-01-18 MED ORDER — MIDAZOLAM HCL 2 MG/2ML IJ SOLN
INTRAMUSCULAR | Status: DC | PRN
  Administered 2015-01-18: 1 mg via INTRAVENOUS

## 2015-01-18 MED ORDER — SODIUM CHLORIDE 0.9 % IV SOLN
INTRAVENOUS | Status: DC | PRN
  Administered 2015-01-18: 150 mL via INTRAVENOUS

## 2015-01-18 MED ORDER — SODIUM CHLORIDE 0.9 % IV SOLN: INTRAVENOUS | Status: AC

## 2015-01-18 MED ORDER — SODIUM CHLORIDE 0.9 % IJ SOLN: 3.0000 mL | Freq: Two times a day (BID) | INTRAMUSCULAR | Status: DC

## 2015-01-18 MED ORDER — SODIUM CHLORIDE 0.9 % IJ SOLN: 3.0000 mL | INTRAMUSCULAR | Status: DC | PRN

## 2015-01-18 MED ORDER — LIDOCAINE HCL (PF) 1 % IJ SOLN
INTRAMUSCULAR | Status: DC | PRN
  Administered 2015-01-18: 15:00:00

## 2015-01-18 MED ORDER — LIDOCAINE HCL (PF) 1 % IJ SOLN
INTRAMUSCULAR | Status: AC
  Filled 2015-01-18: qty 30

## 2015-01-18 MED ORDER — MIDAZOLAM HCL 2 MG/2ML IJ SOLN
INTRAMUSCULAR | Status: AC
  Filled 2015-01-18: qty 2

## 2015-01-18 MED ORDER — ASPIRIN 81 MG PO CHEW: 81.0000 mg | CHEWABLE_TABLET | ORAL | Status: DC

## 2015-01-18 MED ORDER — SODIUM CHLORIDE 0.9 % WEIGHT BASED INFUSION
3.0000 mL/kg/h | INTRAVENOUS | Status: DC
  Administered 2015-01-18: 3 mL/kg/h via INTRAVENOUS

## 2015-01-18 MED ORDER — LIDOCAINE HCL (PF) 1 % IJ SOLN
INTRAMUSCULAR | Status: DC | PRN
  Administered 2015-01-18: 11 mL

## 2015-01-18 MED ORDER — HEPARIN (PORCINE) IN NACL 2-0.9 UNIT/ML-% IJ SOLN
INTRAMUSCULAR | Status: AC
  Filled 2015-01-18: qty 1500

## 2015-01-18 MED ORDER — IOHEXOL 350 MG/ML SOLN
INTRAVENOUS | Status: DC | PRN
  Administered 2015-01-18: 125 mL via INTRAVENOUS

## 2015-01-18 SURGICAL SUPPLY — 12 items
CATH INFINITI 5 FR IM (CATHETERS) ×1 IMPLANT
CATH INFINITI 5FR MULTPACK ANG (CATHETERS) ×1 IMPLANT
GUIDEWIRE ANGLED .035X150CM (WIRE) ×1 IMPLANT
KIT HEART LEFT (KITS) ×2 IMPLANT
PACK CARDIAC CATHETERIZATION (CUSTOM PROCEDURE TRAY) ×2 IMPLANT
SHEATH PINNACLE 5F 10CM (SHEATH) ×1 IMPLANT
SYR MEDRAD MARK V 150ML (SYRINGE) ×2 IMPLANT
TRANSDUCER W/STOPCOCK (MISCELLANEOUS) ×2 IMPLANT
TUBING CIL FLEX 10 FLL-RA (TUBING) ×2 IMPLANT
WIRE EMERALD 3MM-J .035X150CM (WIRE) ×1 IMPLANT
WIRE EMERALD ST .035X150CM (WIRE) ×1 IMPLANT
WIRE HITORQ VERSACORE ST 145CM (WIRE) ×1 IMPLANT

## 2015-01-18 NOTE — H&P (View-Only) (Signed)
Sydney Moore Date of Birth  [birth year] [facility] / [facility] [address]     [address] [place], [place]  [address] [phone]  Fax  [phone]   Problems: 1. CAD - s/p CABG 1989  2. Pacer - for advanced AV block 3. PVD- left carotid bruit 4. CHF - EF of 40% 5. Diabetes Mellitus 6. Hypothyroidism 7. Bilateral carotid bruits:    History of Present Illness:  81 year old female with a history of coronary artery disease and pacemaker implant.  She has a history of peripheral vascular disease and has a known left carotid bruit which we are following.   She's had a history of coronary artery bypass grafting. She denies any shortness of breath or chest pain. She does complain of some leg weakness particularly with walking.  Check pacer generator changed in October. She has developed some keloid formation over the pacer incision  A recent echocardiogram reveals mild to moderate left ventricular dysfunction with an ejection fraction of 40%.  There is moderate mitral regurgitation and moderate tricuspid regurgitation. Right ventricle appears to be dilated. Her pulmonary pressures are at the upper limits of normal at 36 mmHg. Her Lasix dose was increased by Dr. Ladona Ridgel and she's feeling a bit better.  Septeber 17, 2013 - she has done well.  She has occasional episodes of CP when walking - goes away very quickly with a SL NTG.  These episodes of chest pain are very rare. She's able to do most of her normal activities without any significant problems. She has known stenosis in her ramus intermediate branch that was not amenable to PCI. We were not able to put a wire down that vessel. At that point she was started on Ranexa and has done well since that time.  June 27 ,2014: Sydney Moore is doing well.  She has occasional CP and takes NTG occasionally.  Feb. 9, 2015: Sydney Moore is doing ok.  She still has some exertional angina relieved with SL NTG.   She is able to  do all of her normal daily activities without significant limitations as long as she takes the nitroglycerin.   She is on Ranexa 1000 BID.    Her BP is high this am - she has not taken her BP pills yet.  April 13, 2013:  Sydney Mccreedy has been admitted to the hospital since I last saw her. She had a stress Myoview study which revealed a fixed apical defect and no ischemia.  Ejection fraction remained moderately reduced at 35%.  The isosorbide was increased to 120 mg a day.  She continues to have some mild chest pain but thinks that the episodes are decreased from before the hospitalization. She does use sublingual nitroglycerin on a daily basis that several times a day infact.   August 09, 2013:  She is doing well.  No significant angina.     Dec. 1, 2015:  Sydney Moore is a 81 y.o. female who I see for CAD, pacer, HTN, and mild chronic systolic CHF. Has some generalized fatigue. Has rare episodes of angina and use NTG spray.   Symptoms seem to be stable . She has requested a handicap sticker.   Aug. 4, 2016:  Still having some angina. Resolves with 1 SL NTG.  No syncope   Dec. 5, 2016  Having more angina .  Takes more NTG.  Relieves the pain .   Current Outpatient Prescriptions on File Prior to Visit  Medication Sig Dispense Refill  .  amLODipine (NORVASC) 5 MG tablet Take 1 tablet (5 mg total) by mouth daily. 90 tablet 3  . aspirin 81 MG tablet Take 1 tablet (81 mg total) by mouth every morning. 90 tablet 1  . atorvastatin (LIPITOR) 20 MG tablet Take 1 tablet (20 mg total) by mouth daily. 90 tablet 3  . calcium-vitamin D (OSCAL WITH D) 500-200 MG-UNIT per tablet Take 1 tablet by mouth daily.      . clopidogrel (PLAVIX) 75 MG tablet Take 1 tablet (75 mg total) by mouth daily. 90 tablet 3  . furosemide (LASIX) 40 MG tablet Take 1 tablet (40 mg total) by mouth daily. 90 tablet 3  . isosorbide mononitrate (IMDUR) 120 MG 24 hr tablet Take 1 tablet (120 mg total) by mouth daily. 90 tablet 3  .  levothyroxine (SYNTHROID, LEVOTHROID) 50 MCG tablet Take 1 tablet (50 mcg total) by mouth daily. 90 tablet 1  . losartan (COZAAR) 50 MG tablet Take 0.5-1 tablets (25-50 mg total) by mouth daily as needed. Takes when SBP>130    . meclizine (ANTIVERT) 12.5 MG tablet Take 1 tablet (12.5 mg total) by mouth as needed for dizziness (up to three times a day). 30 tablet 0  . metoprolol succinate (TOPROL-XL) 100 MG 24 hr tablet Take 1 tablet (100 mg total) by mouth daily. Take with or immediately following a meal. 90 tablet 3  . nitroGLYCERIN (NITROLINGUAL) 0.4 MG/SPRAY spray Place 1 spray under the tongue every 5 (five) minutes x 3 doses as needed for chest pain. 12 g 3  . potassium chloride SA (K-DUR,KLOR-CON) 20 MEQ tablet Take 1 tablet (20 mEq total) by mouth every evening. 90 tablet 3  . promethazine-codeine (PHENERGAN WITH CODEINE) 6.25-10 MG/5ML syrup Take 5 mLs by mouth every 4 (four) hours as needed for cough. 118 mL 0  . ranolazine (RANEXA) 1000 MG SR tablet Take 1 tablet (1,000 mg total) by mouth 2 (two) times daily. 180 tablet 3   No current facility-administered medications on file prior to visit.    No Known Allergies  Past Medical History  Diagnosis Date  . Hypertension   . Hyperlipidemia   . Diabetes mellitus   . Chronic cough     Seen by Dr. Sherene Sires  . Hypothyroid   . CAD (coronary artery disease) 1989    a. 1989: s/p CABGx3;  b. 06/2007 Cath/Attempted PCI: LM min irregs, LAD 90/100, D1 90 (attempted PCI), RI 90, LCX100p, OM1 100, RCA 60/70p, 90/8m, VG->RCA nl, VG->OM 100, LIMA->LAD nl, EF 50%.  . Complete heart block (HCC)     a. s/p PPM (MDT) by Dr Deborah Chalk 2004; b. Gen change 09/2010 (MDT ADDRL1 Adapta DC PPM Ser #:[id number]  . Carotid artery occlusion 2011    60-79%right/ 40-59% left  . GERD (gastroesophageal reflux disease)   . Chronic systolic CHF (congestive heart failure) (HCC)     a. 03/2011 Echo: EF 40% inflat HK, Gr1 DD, mod MR/TR  . Chest pain March 2014    Past  Surgical History  Procedure Laterality Date  . Coronary artery bypass graft  1989  . Pacemaker placement  2004    MDT by Dr Deborah Chalk  . Cataract extraction  2004    Both  . Cardiac catheterization  2004, 2007, 2009    History  Smoking status  . Former Smoker  . Quit date: 12/15/1973  Smokeless tobacco  . Never Used    History  Alcohol Use No    Family History  Problem Relation Age  of Onset  . Hypertension Mother   . Liver cancer Brother   . Breast cancer Daughter   . Colon cancer Brother   . Cancer Brother     colon    Reviw of Systems:  Reviewed in the HPI.  All other systems are negative.  Physical Exam: BP 130/58 mmHg  Pulse 87  Ht  (1.6 m)  Wt 130 lb (58.968 kg)  BMI 23.03 kg/m2 The patient is alert and oriented x 3.  The mood and affect are normal.   Skin: warm and dry.  Color is normal.    HEENT:   the sclera are nonicteric.  The mucous membranes are moist.  The carotids are 2+ with soft bruits. .  There is no thyromegaly.  Pulsatile next is inferior to her carotid arteries that are either do to a large V wave or perhaps do to her common carotid artery.  Lungs: clear.  The chest wall is non tender.    Heart: regular rate with a normal S1 and S2.  There is a soft systolic murmur The PMI is not displaced.  Her pacer is in the right subclavian.  Abdomen: good bowel sounds.  There is no guarding or rebound.  There is no hepatosplenomegaly or tenderness.  There are no masses.   Extremities:  no clubbing, cyanosis, or edema.  The legs are without rashes.  The distal pulses are diminished  Neuro:  Cranial nerves II - XII are intact.  Motor and sensory functions are intact.    The gait is normal.  ECG:     Assessment / Plan:   1. CAD - s/p CABG 1989  -  She is having progressive unstable angina.  Has angina with everyday activities. On near maximal medication - Norvasc , metooprolol 100, Imdur 120 a day , ranexa 1000 BID She has known CAD with a tight  stenosis in the graft to RCA .   She may had progressive CAD that explains her angina .  I think we should proceed with cardiac catheterization. We discussed the risks benefits and options concerning cardiac catheterization. She understands and agrees to proceed.  2. Pacer - for advanced AV block 3. PVD-  4. CHF - EF of 40% 5. Diabetes Mellitus - managed by diet now.  6. Hypothyroidism 7. Bilateral carotid bruits:   Stable  8. Hyperlipidemia:  Stable , continue atorvastatin 20 , will check lipids today    Avanelle Pixley, Deloris Ping, MD  01/15/2015 8:17 AM    [facility] [address] [place],  [address] [place], [place]  [address] Pager [phone] Phone: [phone]; Fax: [phone]   [facility]  [address] [place], [place]  [address] [phone]   Fax [phone]

## 2015-01-18 NOTE — Discharge Instructions (Signed)

## 2015-01-18 NOTE — Progress Notes (Signed)
Removal of Right Femoral arterial sheath by S. Prahbu. Manual compression applied for 20 minutes without complication. Sterile dressing applied. Dressing dry and intact. Pulses unchanged from prior assessment

## 2015-01-18 NOTE — Interval H&P Note (Signed)
History and Physical Interval Note:  01/18/2015 1:58 PM  Sydney Moore  has presented today for cardiac cath with the diagnosis of unstable angina, known CAD s/p CABG in 1989. The various methods of treatment have been discussed with the patient and family. After consideration of risks, benefits and other options for treatment, the patient has consented to  Procedure(s): Left Heart Cath and Cors/Grafts Angiography (N/A) as a surgical intervention .  The patient's history has been reviewed, patient examined, no change in status, stable for surgery.  I have reviewed the patient's chart and labs.  Questions were answered to the patient's satisfaction.    Cath Lab Visit (complete for each Cath Lab visit)  Clinical Evaluation Leading to the Procedure:   ACS: No.  Non-ACS:    Anginal Classification: CCS III  Anti-ischemic medical therapy: Maximal Therapy (2 or more classes of medications)  Non-Invasive Test Results: No non-invasive testing performed  Prior CABG: Previous CABG         MCALHANY,CHRISTOPHER

## 2015-01-19 ENCOUNTER — Ambulatory Visit: Payer: Medicare Other | Admitting: Cardiology

## 2015-01-19 ENCOUNTER — Encounter (HOSPITAL_COMMUNITY): Payer: Self-pay | Admitting: Cardiovascular Disease

## 2015-01-31 ENCOUNTER — Encounter: Payer: Self-pay | Admitting: Family

## 2015-02-07 ENCOUNTER — Encounter (HOSPITAL_COMMUNITY): Payer: Medicare Other

## 2015-02-07 ENCOUNTER — Ambulatory Visit: Payer: Medicare Other | Admitting: Family

## 2015-02-13 ENCOUNTER — Encounter (HOSPITAL_COMMUNITY): Payer: Medicare Other

## 2015-02-13 ENCOUNTER — Ambulatory Visit: Payer: Medicare Other | Admitting: Family

## 2015-03-05 ENCOUNTER — Encounter: Payer: Medicare Other | Admitting: *Deleted

## 2015-03-07 ENCOUNTER — Encounter: Payer: Self-pay | Admitting: Cardiology

## 2015-03-14 ENCOUNTER — Telehealth: Payer: Self-pay | Admitting: Internal Medicine

## 2015-03-14 NOTE — Telephone Encounter (Signed)
Lease call,having problem transmitting.

## 2015-03-14 NOTE — Telephone Encounter (Signed)
Spoke w/ pt and helped her set up her monitor w/ wirex adapter. Instructed pt that she needed batteries for her home monitor. Pt verbalized understanding.

## 2015-03-26 ENCOUNTER — Ambulatory Visit: Payer: Medicare Other | Admitting: Cardiovascular Disease

## 2015-03-29 ENCOUNTER — Ambulatory Visit (INDEPENDENT_AMBULATORY_CARE_PROVIDER_SITE_OTHER): Payer: Medicare Other | Admitting: Cardiovascular Disease

## 2015-03-29 ENCOUNTER — Encounter: Payer: Self-pay | Admitting: Cardiovascular Disease

## 2015-03-29 VITALS — BP 108/60 | HR 98 | Ht 63.0 in | Wt 128.0 lb

## 2015-03-29 DIAGNOSIS — I2510 Atherosclerotic heart disease of native coronary artery without angina pectoris: Secondary | ICD-10-CM | POA: Diagnosis not present

## 2015-03-29 NOTE — Progress Notes (Signed)
Sydney Moore Date of Birth  1923/07/23   Problems: 1. CAD - s/p CABG 1989  2. Pacer - for advanced AV block 3. PVD- left carotid bruit 4. CHF - EF of 40% 5. Diabetes Mellitus 6. Hypothyroidism 7. Bilateral carotid bruits:    History of Present Illness:  82 year old female with a history of coronary artery disease and pacemaker implant.  She has a history of peripheral vascular disease and has a known left carotid bruit which we are following.   She's had a history of coronary artery bypass grafting. She denies any shortness of breath or chest pain. She does complain of some leg weakness particularly with walking.  Check pacer generator changed in October. She has developed some keloid formation over the pacer incision  A recent echocardiogram reveals mild to moderate left ventricular dysfunction with an ejection fraction of 40%.  There is moderate mitral regurgitation and moderate tricuspid regurgitation. Right ventricle appears to be dilated. Her pulmonary pressures are at the upper limits of normal at 36 mmHg. Her Lasix dose was increased by Dr. Ladona Ridgel and she's feeling a bit better.  Septeber 17, 2013 - she has done well.  She has occasional episodes of CP when walking - goes away very quickly with a SL NTG.  These episodes of chest pain are very rare. She's able to do most of her normal activities without any significant problems. She has known stenosis in her ramus intermediate branch that was not amenable to PCI. We were not able to put a wire down that vessel. At that point she was started on Ranexa and has done well since that time.  June 27 ,2014: Sydney Moore is doing well.  She has occasional CP and takes NTG occasionally.  Feb. 9, 2015: Sydney Moore is doing ok.  She still has some exertional angina relieved with SL NTG.   She is able to do all of her normal daily activities without significant limitations as long as she takes the nitroglycerin.   She is on Ranexa 1000 BID.     Her BP is high this am - she has not taken her BP pills yet.  April 13, 2013:  Sydney Moore has been admitted to the hospital since I last saw her. She had a stress Myoview study which revealed a fixed apical defect and no ischemia.  Ejection fraction remained moderately reduced at 35%.  The isosorbide was increased to 120 mg a day.  She continues to have some mild chest pain but thinks that the episodes are decreased from before the hospitalization. She does use sublingual nitroglycerin on a daily basis that several times a day infact.   August 09, 2013:  She is doing well.  No significant angina.     Dec. 1, 2015:  Sydney Moore is a 82 y.o. female who I see for CAD, pacer, HTN, and mild chronic systolic CHF. Has some generalized fatigue. Has rare episodes of angina and use NTG spray.   Symptoms seem to be stable . She has requested a handicap sticker.   Aug. 4, 2016:  Still having some angina. Resolves with 1 SL NTG.  No syncope   Dec. 5, 2016  Having more angina .  Takes more NTG.  Relieves the pain .   Feb. 16, 2017:  Doing ok Still having to take NTG regularly  - several times a day .   Is on max dose Imdur  Her angina pattern is stable   Current Outpatient  Prescriptions on File Prior to Visit  Medication Sig Dispense Refill  . amLODipine (NORVASC) 5 MG tablet Take 1 tablet (5 mg total) by mouth daily. 90 tablet 3  . aspirin 81 MG tablet Take 1 tablet (81 mg total) by mouth every morning. 90 tablet 1  . atorvastatin (LIPITOR) 20 MG tablet Take 1 tablet (20 mg total) by mouth daily. 90 tablet 3  . calcium-vitamin D (OSCAL WITH D) 500-200 MG-UNIT per tablet Take 1 tablet by mouth daily.      . clopidogrel (PLAVIX) 75 MG tablet Take 1 tablet (75 mg total) by mouth daily. 90 tablet 3  . furosemide (LASIX) 40 MG tablet Take 1 tablet (40 mg total) by mouth daily. 90 tablet 3  . isosorbide mononitrate (IMDUR) 120 MG 24 hr tablet Take 1 tablet (120 mg total) by mouth daily. 90 tablet 3    . levothyroxine (SYNTHROID, LEVOTHROID) 50 MCG tablet Take 1 tablet (50 mcg total) by mouth daily. 90 tablet 1  . losartan (COZAAR) 50 MG tablet Take 0.5-1 tablets (25-50 mg total) by mouth daily as needed. Takes when SBP>130    . meclizine (ANTIVERT) 12.5 MG tablet Take 1 tablet (12.5 mg total) by mouth as needed for dizziness (up to three times a day). 30 tablet 0  . metoprolol succinate (TOPROL-XL) 100 MG 24 hr tablet Take 1 tablet (100 mg total) by mouth daily. Take with or immediately following a meal. 90 tablet 3  . nitroGLYCERIN (NITROLINGUAL) 0.4 MG/SPRAY spray Place 1 spray under the tongue every 5 (five) minutes x 3 doses as needed for chest pain. 12 g 3  . potassium chloride SA (K-DUR,KLOR-CON) 20 MEQ tablet Take 1 tablet (20 mEq total) by mouth every evening. 90 tablet 3  . promethazine-codeine (PHENERGAN WITH CODEINE) 6.25-10 MG/5ML syrup Take 5 mLs by mouth every 4 (four) hours as needed for cough. 118 mL 0  . ranolazine (RANEXA) 1000 MG SR tablet Take 1 tablet (1,000 mg total) by mouth 2 (two) times daily. 180 tablet 3   No current facility-administered medications on file prior to visit.    No Known Allergies  Past Medical History  Diagnosis Date  . Hypertension   . Hyperlipidemia   . Diabetes mellitus   . Chronic cough     Seen by Dr. Sherene Sires  . Hypothyroid   . CAD (coronary artery disease) 1989    a. 1989: s/p CABGx3;  b. 06/2007 Cath/Attempted PCI: LM min irregs, LAD 90/100, D1 90 (attempted PCI), RI 90, LCX100p, OM1 100, RCA 60/70p, 90/63m, VG->RCA nl, VG->OM 100, LIMA->LAD nl, EF 50%.  . Complete heart block (HCC)     a. s/p PPM (MDT) by Dr Deborah Chalk 2004; b. Gen change 09/2010 (MDT ADDRL1 Adapta DC PPM Ser #:[id number]  . Carotid artery occlusion 2011    60-79%right/ 40-59% left  . GERD (gastroesophageal reflux disease)   . Chronic systolic CHF (congestive heart failure) (HCC)     a. 03/2011 Echo: EF 40% inflat HK, Gr1 DD, mod MR/TR  . Chest pain March 2014    Past  Surgical History  Procedure Laterality Date  . Coronary artery bypass graft  1989  . Pacemaker placement  2004    MDT by Dr Deborah Chalk  . Cataract extraction  2004    Both  . Cardiac catheterization  2004, 2007, 2009  . Cardiac catheterization N/A 01/18/2015    Procedure: Left Heart Cath and Cors/Grafts Angiography;  Surgeon: Kathleene Hazel, MD;  [facility] INV[facility]  CV LAB;  Service: Cardiovascular;  Laterality: N/A;    History  Smoking status  . Former Smoker  . Quit date: 12/15/1973  Smokeless tobacco  . Never Used    History  Alcohol Use No    Family History  Problem Relation Age of Onset  . Hypertension Mother   . Liver cancer Brother   . Breast cancer Daughter   . Colon cancer Brother   . Cancer Brother     colon    Reviw of Systems:  Reviewed in the HPI.  All other systems are negative.  Physical Exam: BP 108/60 mmHg  Pulse 98  Ht  (1.6 m)  Wt 128 lb (58.06 kg)  BMI 22.68 kg/m2 The patient is alert and oriented x 3.  The mood and affect are normal.   Skin: warm and dry.  Color is normal.    HEENT:   the sclera are nonicteric.  The mucous membranes are moist.  The carotids are 2+ with soft bruits. .  There is no thyromegaly.  Pulsatile next is inferior to her carotid arteries that are either do to a large V wave or perhaps do to her common carotid artery.  Lungs: clear.  The chest wall is non tender.    Heart: regular rate with a normal S1 and S2.  There is a soft systolic murmur The PMI is not displaced.  Her pacer is in the right subclavian.  Abdomen: good bowel sounds.  There is no guarding or rebound.  There is no hepatosplenomegaly or tenderness.  There are no masses.   Extremities:  no clubbing, cyanosis, or edema.  The legs are without rashes.  The distal pulses are diminished  Neuro:  Cranial nerves II - XII are intact.  Motor and sensory functions are intact.    The gait is normal.  ECG:     Assessment / Plan:   1. CAD - s/p CABG  1989  -  She is having   Angina but it seems to be stable .  Has angina with everyday activities. On near maximal medication - Norvasc , metooprolol 100, Imdur 120 a day , ranexa 1000 BID She has known CAD with a tight stenosis in the graft to RCA .  At her cath, there were no culprit lesions that could be revascularized   2. Pacer - for advanced AV block 3. PVD-  4. CHF - EF of 40% 5. Diabetes Mellitus - managed by diet now.  6. Hypothyroidism 7. Bilateral carotid bruits:   Stable  8. Hyperlipidemia:  Stable , continue atorvastatin 20 , will check lipids , liver, BMP in 6 months    Nahser, Deloris Ping, MD  03/29/2015 8:43 AM    [facility] [address] [place],  [address] [place], [place]  [address] Pager [phone] Phone: [phone]; Fax: [phone]   [facility]  [address] [place], [place]  [address] [phone]   Fax [phone]

## 2015-03-29 NOTE — Patient Instructions (Signed)

## 2015-04-30 ENCOUNTER — Ambulatory Visit (INDEPENDENT_AMBULATORY_CARE_PROVIDER_SITE_OTHER): Payer: Medicare Other | Admitting: Internal Medicine

## 2015-04-30 ENCOUNTER — Encounter: Payer: Self-pay | Admitting: Internal Medicine

## 2015-04-30 VITALS — BP 115/54 | HR 90 | Temp 97.7°F | Ht 63.0 in | Wt 124.8 lb

## 2015-04-30 DIAGNOSIS — I2510 Atherosclerotic heart disease of native coronary artery without angina pectoris: Secondary | ICD-10-CM

## 2015-04-30 DIAGNOSIS — M7632 Iliotibial band syndrome, left leg: Secondary | ICD-10-CM

## 2015-04-30 DIAGNOSIS — M7630 Iliotibial band syndrome, unspecified leg: Secondary | ICD-10-CM | POA: Insufficient documentation

## 2015-04-30 DIAGNOSIS — D649 Anemia, unspecified: Secondary | ICD-10-CM | POA: Diagnosis not present

## 2015-04-30 MED ORDER — LEVOTHYROXINE SODIUM 50 MCG PO TABS: 50.0000 ug | ORAL_TABLET | Freq: Every day | ORAL | Status: AC

## 2015-04-30 MED ORDER — PROMETHAZINE-CODEINE 6.25-10 MG/5ML PO SYRP: 5.0000 mL | ORAL_SOLUTION | ORAL | Status: DC | PRN

## 2015-04-30 MED ORDER — DICLOFENAC SODIUM 1 % TD GEL: 2.0000 g | Freq: Four times a day (QID) | TRANSDERMAL | Status: DC

## 2015-04-30 NOTE — Progress Notes (Signed)
   Subjective:    Patient ID: Sydney Moore, female    DOB: 1923/07/27, 82 y.o.   MRN: [id number]  HPI The patient is an 82 YO female coming in for a new problem. She had fallen about 1 year ago and hurt her left hip and IT band. This pain faded and she had used aspercreme which was helpful. She had it to come back in the last 2 weeks. She denies falls or new injury to the area. She has tried the aspercreme which did not help this time. She wants to know if there is a cream she can use. She does not want another medicine. Having some decreased appetite since she stopped exercising (she used to go to class at gym a couple times a week). Sleeping well about 50% of the time.   Review of Systems  Constitutional: Negative for fever, chills, activity change, appetite change, fatigue and unexpected weight change.  Respiratory: Negative for cough, chest tightness, shortness of breath and wheezing.   Cardiovascular: Negative for chest pain, palpitations and leg swelling.  Gastrointestinal: Negative for nausea, vomiting, abdominal pain, diarrhea and abdominal distention.  Musculoskeletal: Positive for arthralgias. Negative for back pain and gait problem.  Skin: Negative.   Neurological: Negative for dizziness, seizures, speech difficulty, weakness, numbness and headaches.  Psychiatric/Behavioral: Negative.       Objective:   Physical Exam  Constitutional: She is oriented to person, place, and time. She appears well-developed and well-nourished.  HENT:  Head: Normocephalic and atraumatic.  Eyes: EOM are normal.  Neck: Normal range of motion.  Cardiovascular: Normal rate and regular rhythm.   Pulmonary/Chest: Effort normal and breath sounds normal. No respiratory distress. She has no wheezes. She has no rales.  Abdominal: Soft. She exhibits no distension. There is no tenderness. There is no rebound.  Musculoskeletal: She exhibits no edema.  Neurological: She is alert and oriented to person, place,  and time. Coordination normal.  Skin: Skin is warm and dry.  Psychiatric: She has a normal mood and affect.   Filed Vitals:   04/30/15 0840  BP: 115/54  Pulse: 90  Temp: 97.7 F (36.5 C)  TempSrc: Oral  Height: 5\' 3"  (1.6 m)  Weight: 124 lb 12 oz (56.586 kg)  SpO2: 98%      Assessment & Plan:

## 2015-04-30 NOTE — Assessment & Plan Note (Signed)
Chronic, stable. Not symptomatic. Aged out of colonoscopy but has had in the past.

## 2015-04-30 NOTE — Assessment & Plan Note (Signed)
Rx for voltaren gel for the pain. Talked to her about some stretching and returning to the gym for the senior classes when she is able. No pain with walking.

## 2015-04-30 NOTE — Patient Instructions (Signed)
We have sent in the voltaren gel for your leg that you can use on it up to 3 times per day.

## 2015-05-26 ENCOUNTER — Emergency Department (HOSPITAL_COMMUNITY): Payer: Medicare Other

## 2015-05-26 ENCOUNTER — Observation Stay (HOSPITAL_COMMUNITY)
Admission: EM | Admit: 2015-05-26 | Discharge: 2015-05-29 | Disposition: A | Discharge status: Home / Self Care | Payer: Medicare Other | Attending: Internal Medicine | Admitting: Internal Medicine

## 2015-05-26 ENCOUNTER — Encounter (HOSPITAL_COMMUNITY): Payer: Self-pay | Admitting: Family Medicine

## 2015-05-26 DIAGNOSIS — Y9289 Other specified places as the place of occurrence of the external cause: Secondary | ICD-10-CM | POA: Diagnosis not present

## 2015-05-26 DIAGNOSIS — I10 Essential (primary) hypertension: Secondary | ICD-10-CM | POA: Diagnosis not present

## 2015-05-26 DIAGNOSIS — J9601 Acute respiratory failure with hypoxia: Secondary | ICD-10-CM

## 2015-05-26 DIAGNOSIS — IMO0002 Reserved for concepts with insufficient information to code with codable children: Secondary | ICD-10-CM

## 2015-05-26 DIAGNOSIS — I6529 Occlusion and stenosis of unspecified carotid artery: Secondary | ICD-10-CM | POA: Insufficient documentation

## 2015-05-26 DIAGNOSIS — W19XXXA Unspecified fall, initial encounter: Secondary | ICD-10-CM

## 2015-05-26 DIAGNOSIS — T148 Other injury of unspecified body region: Secondary | ICD-10-CM

## 2015-05-26 DIAGNOSIS — E039 Hypothyroidism, unspecified: Secondary | ICD-10-CM | POA: Diagnosis not present

## 2015-05-26 DIAGNOSIS — Y998 Other external cause status: Secondary | ICD-10-CM | POA: Insufficient documentation

## 2015-05-26 DIAGNOSIS — Z951 Presence of aortocoronary bypass graft: Secondary | ICD-10-CM | POA: Diagnosis not present

## 2015-05-26 DIAGNOSIS — E119 Type 2 diabetes mellitus without complications: Secondary | ICD-10-CM | POA: Diagnosis not present

## 2015-05-26 DIAGNOSIS — I2510 Atherosclerotic heart disease of native coronary artery without angina pectoris: Secondary | ICD-10-CM | POA: Insufficient documentation

## 2015-05-26 DIAGNOSIS — Z7902 Long term (current) use of antithrombotics/antiplatelets: Secondary | ICD-10-CM | POA: Diagnosis not present

## 2015-05-26 DIAGNOSIS — E785 Hyperlipidemia, unspecified: Secondary | ICD-10-CM | POA: Insufficient documentation

## 2015-05-26 DIAGNOSIS — S32010A Wedge compression fracture of first lumbar vertebra, initial encounter for closed fracture: Principal | ICD-10-CM | POA: Insufficient documentation

## 2015-05-26 DIAGNOSIS — Y9389 Activity, other specified: Secondary | ICD-10-CM | POA: Insufficient documentation

## 2015-05-26 DIAGNOSIS — I119 Hypertensive heart disease without heart failure: Secondary | ICD-10-CM | POA: Diagnosis present

## 2015-05-26 DIAGNOSIS — W108XXA Fall (on) (from) other stairs and steps, initial encounter: Secondary | ICD-10-CM | POA: Diagnosis not present

## 2015-05-26 DIAGNOSIS — R7989 Other specified abnormal findings of blood chemistry: Secondary | ICD-10-CM | POA: Diagnosis not present

## 2015-05-26 DIAGNOSIS — I5022 Chronic systolic (congestive) heart failure: Secondary | ICD-10-CM | POA: Insufficient documentation

## 2015-05-26 DIAGNOSIS — Z7982 Long term (current) use of aspirin: Secondary | ICD-10-CM | POA: Diagnosis not present

## 2015-05-26 DIAGNOSIS — I110 Hypertensive heart disease with heart failure: Secondary | ICD-10-CM | POA: Diagnosis not present

## 2015-05-26 DIAGNOSIS — Z87891 Personal history of nicotine dependence: Secondary | ICD-10-CM | POA: Insufficient documentation

## 2015-05-26 DIAGNOSIS — R0602 Shortness of breath: Secondary | ICD-10-CM

## 2015-05-26 DIAGNOSIS — R0902 Hypoxemia: Secondary | ICD-10-CM | POA: Diagnosis not present

## 2015-05-26 DIAGNOSIS — K219 Gastro-esophageal reflux disease without esophagitis: Secondary | ICD-10-CM | POA: Diagnosis not present

## 2015-05-26 DIAGNOSIS — S3992XA Unspecified injury of lower back, initial encounter: Secondary | ICD-10-CM | POA: Diagnosis present

## 2015-05-26 LAB — COMPREHENSIVE METABOLIC PANEL
ALBUMIN: 3.6 g/dL (ref 3.5–5.0)
ALK PHOS: 61 U/L (ref 38–126)
ALT: 19 U/L (ref 14–54)
ANION GAP: 9 (ref 5–15)
AST: 39 U/L (ref 15–41)
BILIRUBIN TOTAL: 0.9 mg/dL (ref 0.3–1.2)
BUN: 12 mg/dL (ref 6–20)
CALCIUM: 8.6 mg/dL — AB (ref 8.9–10.3)
CO2: 26 mmol/L (ref 22–32)
CREATININE: 0.91 mg/dL (ref 0.44–1.00)
Chloride: 101 mmol/L (ref 101–111)
GFR calc non Af Amer: 54 mL/min — ABNORMAL LOW (ref >60)
Glucose, Bld: 113 mg/dL — ABNORMAL HIGH (ref 65–99)
Potassium: 3.3 mmol/L — ABNORMAL LOW (ref 3.5–5.1)
Sodium: 136 mmol/L (ref 135–145)
TOTAL PROTEIN: 6.9 g/dL (ref 6.5–8.1)

## 2015-05-26 LAB — CBC WITH DIFFERENTIAL/PLATELET
BASOS ABS: 0 10*3/uL (ref 0.0–0.1)
BASOS PCT: 0 %
EOS PCT: 0 %
Eosinophils Absolute: 0 10*3/uL (ref 0.0–0.7)
HCT: 30.3 % — ABNORMAL LOW (ref 36.0–46.0)
Hemoglobin: 10.4 g/dL — ABNORMAL LOW (ref 12.0–15.0)
Lymphocytes Relative: 15 %
Lymphs Abs: 0.9 10*3/uL (ref 0.7–4.0)
MCH: 32.6 pg (ref 26.0–34.0)
MCHC: 34.3 g/dL (ref 30.0–36.0)
MCV: 95 fL (ref 78.0–100.0)
MONO ABS: 0.7 10*3/uL (ref 0.1–1.0)
MONOS PCT: 12 %
Neutro Abs: 4.2 10*3/uL (ref 1.7–7.7)
Neutrophils Relative %: 73 %
PLATELETS: 173 10*3/uL (ref 150–400)
RBC: 3.19 MIL/uL — ABNORMAL LOW (ref 3.87–5.11)
RDW: 14.1 % (ref 11.5–15.5)
WBC: 5.8 10*3/uL (ref 4.0–10.5)

## 2015-05-26 LAB — BRAIN NATRIURETIC PEPTIDE: B Natriuretic Peptide: 665.9 pg/mL — ABNORMAL HIGH (ref 0.0–100.0)

## 2015-05-26 LAB — I-STAT TROPONIN, ED
TROPONIN I, POC: 0.01 ng/mL (ref 0.00–0.08)
Troponin i, poc: 0 ng/mL (ref 0.00–0.08)

## 2015-05-26 MED ORDER — HYDROCODONE-ACETAMINOPHEN 5-325 MG PO TABS
1.0000 | ORAL_TABLET | Freq: Once | ORAL | Status: AC
  Administered 2015-05-26: 1 via ORAL
  Filled 2015-05-26: qty 1

## 2015-05-26 MED ORDER — FENTANYL CITRATE (PF) 100 MCG/2ML IJ SOLN
25.0000 ug | Freq: Once | INTRAMUSCULAR | Status: AC
  Administered 2015-05-26: 25 ug via INTRAVENOUS
  Filled 2015-05-26: qty 2

## 2015-05-26 MED ORDER — FUROSEMIDE 10 MG/ML IJ SOLN
40.0000 mg | Freq: Once | INTRAMUSCULAR | Status: AC
  Administered 2015-05-26: 40 mg via INTRAVENOUS
  Filled 2015-05-26: qty 4

## 2015-05-26 MED ORDER — ONDANSETRON HCL 4 MG/2ML IJ SOLN
INTRAMUSCULAR | Status: AC
  Filled 2015-05-26: qty 2

## 2015-05-26 MED ORDER — ONDANSETRON HCL 4 MG/2ML IJ SOLN
4.0000 mg | Freq: Once | INTRAMUSCULAR | Status: AC
  Administered 2015-05-26: 4 mg via INTRAVENOUS

## 2015-05-26 NOTE — ED Notes (Signed)
Attempted report 

## 2015-05-26 NOTE — ED Provider Notes (Signed)
CSN: [id number]     Arrival date & time 05/26/15  1726 History   First MD Initiated Contact with Patient 05/26/15 1752     Chief Complaint  Patient presents with  . Fall     (Consider location/radiation/quality/duration/timing/severity/associated sxs/prior Treatment) The history is provided by the patient.    82 year old African-American female with past medical history of hypertension diabetes coronary artery disease and CHF who presents with low back pain after fall. The patient was attempting to walk up steps today when she fell backwards on the first step and struck her back on a nearby wall. She reports immediate onset of aching throbbing 9 out of 10 lower back pain. The pain does not radiate. Denies any loss of bowel or bladder function. Denies any numbness or paresthesias of the lower extremities. She does note that when the pain hit her she became acutely short of breath but she also endorses shortness of breath over the last several days, particularly with exertion. Denies any chest pain. Denies any other areas of tenderness.  Past Medical History  Diagnosis Date  . Hypertension   . Hyperlipidemia   . Diabetes mellitus   . Chronic cough     Seen by Dr. Sherene Sires  . Hypothyroid   . CAD (coronary artery disease) 1989    a. 1989: s/p CABGx3;  b. 06/2007 Cath/Attempted PCI: LM min irregs, LAD 90/100, D1 90 (attempted PCI), RI 90, LCX100p, OM1 100, RCA 60/70p, 90/79m, VG->RCA nl, VG->OM 100, LIMA->LAD nl, EF 50%.  . Complete heart block (HCC)     a. s/p PPM (MDT) by Dr Deborah Chalk 2004; b. Gen change 09/2010 (MDT ADDRL1 Adapta DC PPM Ser #:[id number]  . Carotid artery occlusion 2011    60-79%right/ 40-59% left  . GERD (gastroesophageal reflux disease)   . Chronic systolic CHF (congestive heart failure) (HCC)     a. 03/2011 Echo: EF 40% inflat HK, Gr1 DD, mod MR/TR  . Chest pain March 2014   Past Surgical History  Procedure Laterality Date  . Coronary artery bypass graft  1989  .  Pacemaker placement  2004    MDT by Dr Deborah Chalk  . Cataract extraction  2004    Both  . Cardiac catheterization  2004, 2007, 2009  . Cardiac catheterization N/A 01/18/2015    Procedure: Left Heart Cath and Cors/Grafts Angiography;  Surgeon: Kathleene Hazel, MD;  Location: [facility] INVASIVE CV LAB;  Service: Cardiovascular;  Laterality: N/A;   Family History  Problem Relation Age of Onset  . Hypertension Mother   . Liver cancer Brother   . Breast cancer Daughter   . Colon cancer Brother   . Cancer Brother     colon   Social History  Substance Use Topics  . Smoking status: Former Smoker    Quit date: 12/15/1973  . Smokeless tobacco: Never Used  . Alcohol Use: No   OB History    No data available     Review of Systems  Constitutional: Positive for fatigue. Negative for fever and chills.  HENT: Negative for congestion and rhinorrhea.   Eyes: Negative for visual disturbance.  Respiratory: Positive for shortness of breath. Negative for cough and wheezing.   Cardiovascular: Negative for chest pain and leg swelling.  Gastrointestinal: Negative for nausea, vomiting, abdominal pain and diarrhea.  Genitourinary: Negative for flank pain.  Musculoskeletal: Positive for back pain. Negative for neck pain and neck stiffness.  Skin: Negative for rash.  Allergic/Immunologic: Negative for immunocompromised state.  Neurological: Negative for  syncope, weakness and headaches.      Allergies  Review of patient's allergies indicates no known allergies.  Home Medications   Prior to Admission medications   Medication Sig Start Date End Date Taking? Authorizing Provider  amLODipine (NORVASC) 5 MG tablet Take 1 tablet (5 mg total) by mouth daily. 07/03/14   Vesta Mixer, MD  aspirin 81 MG tablet Take 1 tablet (81 mg total) by mouth every morning. 08/08/13   Newt Lukes, MD  atorvastatin (LIPITOR) 20 MG tablet Take 1 tablet (20 mg total) by mouth daily. 07/03/14   Vesta Mixer, MD    calcium-vitamin D (OSCAL WITH D) 500-200 MG-UNIT per tablet Take 1 tablet by mouth daily.      Historical Provider, MD  clopidogrel (PLAVIX) 75 MG tablet Take 1 tablet (75 mg total) by mouth daily. 07/03/14   Vesta Mixer, MD  diclofenac sodium (VOLTAREN) 1 % GEL Apply 2 g topically 4 (four) times daily. 04/30/15   Myrlene Broker, MD  furosemide (LASIX) 40 MG tablet Take 1 tablet (40 mg total) by mouth daily. 01/01/15   Newt Lukes, MD  isosorbide mononitrate (IMDUR) 120 MG 24 hr tablet Take 1 tablet (120 mg total) by mouth daily. 07/03/14   Vesta Mixer, MD  levothyroxine (SYNTHROID, LEVOTHROID) 50 MCG tablet Take 1 tablet (50 mcg total) by mouth daily. 04/30/15   Myrlene Broker, MD  losartan (COZAAR) 50 MG tablet Take 0.5-1 tablets (25-50 mg total) by mouth daily as needed. Takes when SBP>130 01/01/15   Newt Lukes, MD  meclizine (ANTIVERT) 12.5 MG tablet Take 1 tablet (12.5 mg total) by mouth as needed for dizziness (up to three times a day). 01/13/14   Newt Lukes, MD  metoprolol succinate (TOPROL-XL) 100 MG 24 hr tablet Take 1 tablet (100 mg total) by mouth daily. Take with or immediately following a meal. 07/03/14   Vesta Mixer, MD  nitroGLYCERIN (NITROLINGUAL) 0.4 MG/SPRAY spray Place 1 spray under the tongue every 5 (five) minutes x 3 doses as needed for chest pain. 07/03/14   Vesta Mixer, MD  potassium chloride SA (K-DUR,KLOR-CON) 20 MEQ tablet Take 1 tablet (20 mEq total) by mouth every evening. 07/03/14   Vesta Mixer, MD  promethazine-codeine [facility] WITH CODEINE) 6.25-10 MG/5ML syrup Take 5 mLs by mouth every 4 (four) hours as needed for cough. 04/30/15   Myrlene Broker, MD  ranolazine (RANEXA) 1000 MG SR tablet Take 1 tablet (1,000 mg total) by mouth 2 (two) times daily. 07/04/14   Vesta Mixer, MD   BP 144/61 mmHg  Pulse 62  Temp(Src) 98.3 F (36.8 C)  Resp 22  SpO2 97% Physical Exam  Constitutional: She is oriented to person,  place, and time. She appears well-developed and well-nourished. No distress.  HENT:  Head: Normocephalic.  Mouth/Throat: No oropharyngeal exudate.  Eyes: Pupils are equal, round, and reactive to light.  Neck: Normal range of motion. Neck supple.  Cardiovascular: Normal rate, regular rhythm, normal heart sounds and intact distal pulses.   Pulmonary/Chest: Effort normal and breath sounds normal. No respiratory distress. She has no wheezes. She has no rales.  Abdominal: Soft. She exhibits no distension. There is no tenderness.  Musculoskeletal: She exhibits no edema.       Lumbar back: She exhibits bony tenderness and pain. She exhibits no deformity.       Back:  Neurological: She is alert and oriented to person, place, and time.  LE strength 5/5 proximally and distally bilaterally. Normal Babinski. Normal reflexes (2+ patellar, 2+ Achilles). Normal sensation to light touch in LE bilaterally.   Skin: Skin is warm. No rash noted.  Nursing note and vitals reviewed.   ED Course  Procedures (including critical care time) Labs Review Labs Reviewed  CBC WITH DIFFERENTIAL/PLATELET  COMPREHENSIVE METABOLIC PANEL  BRAIN NATRIURETIC PEPTIDE  I-STAT TROPOININ, ED    Imaging Review Dg Pelvis Portable  05/26/2015  CLINICAL DATA:  Acute onset of centralized lower back pain after fall. Initial encounter. EXAM: PORTABLE PELVIS 1-2 VIEWS COMPARISON:  CTA of the chest, abdomen and pelvis performed 04/28/2012 FINDINGS: There is no evidence of fracture or dislocation. Both femoral heads are seated normally within their respective acetabula. Mild degenerative change is noted at the sacroiliac joints. The visualized bowel gas pattern is grossly unremarkable in appearance. Scattered vascular calcifications are seen. IMPRESSION: No evidence of fracture or dislocation. Electronically Signed   By: Roanna Raider M.D.   On: 05/26/2015 18:34   Dg Chest Portable 1 View  05/26/2015  CLINICAL DATA:  Patient with  low back pain status post fall. EXAM: PORTABLE CHEST 1 VIEW COMPARISON:  Chest radiograph 03/28/2013. FINDINGS: Multi lead pacer apparatus overlies the right hemi thorax, leads are stable in position. Stable cardiac and mediastinal contours. Tortuosity and calcification of the thoracic aorta. Markedly low lung volumes. No large area pulmonary consolidation. No pleural effusion or pneumothorax. Osseous skeleton is unremarkable. IMPRESSION: No acute cardiopulmonary process.  Low lung volumes. Electronically Signed   By: Annia Belt M.D.   On: 05/26/2015 18:31   I have personally reviewed and evaluated these images and lab results as part of my medical decision-making.   EKG Interpretation   Date/Time:  Saturday May 26 2015 18:30:44 EDT Ventricular Rate:  60 PR Interval:  46 QRS Duration: 175 QT Interval:  529 QTC Calculation: 529 R Axis:   -73 Text Interpretation:  AV dual-paced rhythm No significant change since  last tracing Confirmed by Anitra Lauth  MD, Alphonzo Lemmings ([id number]) on 05/26/2015  6:35:45 PM      MDM   82 year old female who presents with shortness of breath and lower back pain after tripping backwards while walking upstairs. She strongly denies any head injury with no loss of consciousness or headache. On arrival, patient hypoxic to mid 80s on room air which improved with 2 L nasal cannula. Remainder of exam as above. Regarding the patient's back pain, she does have midline tenderness over the lumbar spine. Will obtain CT of the T and L-spine. Regarding her shortness of breath, it is unclear whether this began acutely during the trauma or had been worsening over the last several days. Differential includes possible ACS, CHF, pneumonia, or traumatic abnormality such as rib fracture, splinting from back pain, or pneumothorax. Will obtain chest x-ray as well as CT of the chest for further assessment.  Labs and imaging read as above. CT shows an L1 superior endplate compression fracture but no  bony retropulsion or height loss. Pain is improving with small dose of fentanyl. However, the patient remains hypoxic. CT shows no evidence of rib fracture or pneumothorax. There is also no pneumonia. Labs reviewed and remarkable primarily for an elevated BNP. It is possible that the patient had a component of flash edema secondary to increased blood pressure from pain, versus splinting from her back pain. Given her persistent hypoxia, will admit for diuresis and pain control.  Discussed case with Hospitalist. Given persistent hypoxia, will send D-Dimer.  Discussed possible CT with contrast for further assessment, btu given clinical stability, otherwise well-appearance, hospitalist recommends D-Dimer and will follow-up as inpatient. Believe this is appropriate given absence of any signs of DVT.  Clinical Impression: 1. Fall, initial encounter   2. Hypoxia   3. Compression fracture   4. Elevated brain natriuretic peptide (BNP) level   5. Acute respiratory failure with hypoxia (HCC)     Disposition: Admit  Condition: Stable  Pt seen in conjunction with Dr. Era Skeen, MD 05/27/15 1914  Gwyneth Sprout, MD 05/27/15 2354

## 2015-05-26 NOTE — H&P (Signed)
[facility] History and Physical  TRINATY BUNDRICK ZOX:[id number] DOB: [birth year] DOA: 05/26/2015  PCP: Myrlene Broker, MD   Chief Complaint: Back pain after mechanical fall  HPI: Sydney Moore is a 82 y.o. woman with an extensive cardiac history including compensated systolic heart failure, CAD S/P CABG, S/P PPM implant for complete heart block, HTN, HLD, and diet controlled diabetes who feels that she was in her baseline state of health until she had an accidental fall today.  She was talking to someone, became distracted, and missed a step.  She developed acute, intractable low back pain.  Her daughter called [phone] for transport to the ED.  Imaging shows a mild compression fracture to L1.  The patient is having intractable pain, refractory to IV pain medication given in the ED.  She is also having intermittent episodes of hypoxia, requiring supplemental oxygen, which is new.  No chest pain or shortness of breath.  No dizziness or syncope.  No headache, no nausea, no vomiting, no abdominal pain.  Review of Systems: 12 systems reviewed and negative except as stated in the HPI.  Past Medical History  Diagnosis Date  . Hypertension   . Hyperlipidemia   . Diabetes mellitus   . Chronic cough     Seen by Dr. Sherene Sires  . Hypothyroid   . CAD (coronary artery disease) 1989    a. 1989: s/p CABGx3;  b. 06/2007 Cath/Attempted PCI: LM min irregs, LAD 90/100, D1 90 (attempted PCI), RI 90, LCX100p, OM1 100, RCA 60/70p, 90/34m, VG->RCA nl, VG->OM 100, LIMA->LAD nl, EF 50%.  . Complete heart block (HCC)     a. s/p PPM (MDT) by Dr Deborah Chalk 2004; b. Gen change 09/2010 (MDT ADDRL1 Adapta DC PPM Ser #:[id number]  . Carotid artery occlusion 2011    60-79%right/ 40-59% left  . GERD (gastroesophageal reflux disease)   . Chronic systolic CHF (congestive heart failure) (HCC)     a. 03/2011 Echo: EF 40% inflat HK, Gr1 DD, mod MR/TR  . Chest pain March 2014   Past Surgical History  Procedure Laterality Date   . Coronary artery bypass graft  1989  . Pacemaker placement  2004    MDT by Dr Deborah Chalk  . Cataract extraction  2004    Both  . Cardiac catheterization  2004, 2007, 2009  . Cardiac catheterization N/A 01/18/2015    Procedure: Left Heart Cath and Cors/Grafts Angiography;  Surgeon: Kathleene Hazel, MD;  Location: [facility] INVASIVE CV LAB;  Service: Cardiovascular;  Laterality: N/A;   Social History:  Social History   Social History Narrative   Health Care POA: son, Marinell Igarashi   Emergency Contact: son, Carsynn Bethune [phone]   End of Life Plan:    Who lives with you: Self   Any pets: none   Diet: Patient has a varied diet of protein, starch, vegetables, and fruit   Exercise: Patient does group exercises 2x week and walks 2x week.   Seatbelts: Patient reports wearing seatbelt when in vehicle.    Sun Exposure/Protection: Patient reports not using sun protection.   Hobbies: Church, watching tv, walking        No tobacco, EtOH, or illicit drug use.  She is a widow.  She has two adult children.  No Known Allergies  Family History  Problem Relation Age of Onset  . Hypertension Mother   . Liver cancer Brother   . Breast cancer Daughter   . Colon cancer Brother   . Cancer Brother  colon   Prior to Admission medications   Medication Sig Start Date End Date Taking? Authorizing Provider  amLODipine (NORVASC) 5 MG tablet Take 1 tablet (5 mg total) by mouth daily. 07/03/14  Yes Vesta Mixer, MD  aspirin 81 MG tablet Take 1 tablet (81 mg total) by mouth every morning. 08/08/13  Yes Newt Lukes, MD  atorvastatin (LIPITOR) 20 MG tablet Take 1 tablet (20 mg total) by mouth daily. 07/03/14  Yes Vesta Mixer, MD  calcium-vitamin D (OSCAL WITH D) 500-200 MG-UNIT per tablet Take 1 tablet by mouth daily.     Yes Historical Provider, MD  clopidogrel (PLAVIX) 75 MG tablet Take 1 tablet (75 mg total) by mouth daily. 07/03/14  Yes Vesta Mixer, MD  furosemide (LASIX) 40 MG  tablet Take 1 tablet (40 mg total) by mouth daily. 01/01/15  Yes Newt Lukes, MD  isosorbide mononitrate (IMDUR) 120 MG 24 hr tablet Take 1 tablet (120 mg total) by mouth daily. 07/03/14  Yes Vesta Mixer, MD  levothyroxine (SYNTHROID, LEVOTHROID) 50 MCG tablet Take 1 tablet (50 mcg total) by mouth daily. 04/30/15  Yes Myrlene Broker, MD  losartan (COZAAR) 50 MG tablet Take 0.5-1 tablets (25-50 mg total) by mouth daily as needed. Takes when SBP>130 01/01/15  Yes Newt Lukes, MD  meclizine (ANTIVERT) 12.5 MG tablet Take 1 tablet (12.5 mg total) by mouth as needed for dizziness (up to three times a day). 01/13/14  Yes Newt Lukes, MD  metoprolol succinate (TOPROL-XL) 100 MG 24 hr tablet Take 1 tablet (100 mg total) by mouth daily. Take with or immediately following a meal. 07/03/14  Yes Vesta Mixer, MD  nitroGLYCERIN (NITROLINGUAL) 0.4 MG/SPRAY spray Place 1 spray under the tongue every 5 (five) minutes x 3 doses as needed for chest pain. 07/03/14  Yes Vesta Mixer, MD  potassium chloride SA (K-DUR,KLOR-CON) 20 MEQ tablet Take 1 tablet (20 mEq total) by mouth every evening. 07/03/14  Yes Vesta Mixer, MD  promethazine-codeine [facility] WITH CODEINE) 6.25-10 MG/5ML syrup Take 5 mLs by mouth every 4 (four) hours as needed for cough. 04/30/15  Yes Myrlene Broker, MD  ranolazine (RANEXA) 1000 MG SR tablet Take 1 tablet (1,000 mg total) by mouth 2 (two) times daily. 07/04/14  Yes Vesta Mixer, MD   Physical Exam: Filed Vitals:   05/26/15 2200 05/26/15 2215 05/26/15 2245 05/26/15 2330  BP: 163/61 160/67 157/72 168/68  Pulse: 60 60 59 59  Temp:      TempSrc:      Resp: 18 15 20 27   SpO2: 88% 94% 98% 99%    General:  Awake and alert.  Oriented to person, place, time and situation.  In pain.  Head: [place]/AT  Eyes: Surgical pupils bilaterally  ENT: Mucous membranes are moist.  No nasal drainage.  Neck is supple.  Cardiovascular: NR/RR.  No LE  edema.  Respiratory: CTA bilaterally.  GI: Abdomen is soft/NT/ND.  Bowel sounds are present.  No guarding.  Skin: Warm and dry.  Musculoskeletal: Moves all four extremities spontaneously.  TTP over lumbar spine.  Psychiatric: Normal affect.  Neurologic: No focal deficits.  Labs on Admission:  Basic Metabolic Panel:  Recent Labs Lab 05/26/15 2013  NA 136  K 3.3*  CL 101  CO2 26  GLUCOSE 113*  BUN 12  CREATININE 0.91  CALCIUM 8.6*   Liver Function Tests:  Recent Labs Lab 05/26/15 2013  AST 39  ALT 19  ALKPHOS 61  BILITOT 0.9  PROT 6.9  ALBUMIN 3.6   CBC:  Recent Labs Lab 05/26/15 2013  WBC 5.8  NEUTROABS 4.2  HGB 10.4*  HCT 30.3*  MCV 95.0  PLT 173   BNP (last 3 results)  Recent Labs  05/26/15 2014  BNP 665.9*   Radiological Exams on Admission: Ct Chest Wo Contrast  05/26/2015  CLINICAL DATA:  Fall.  Hypoxia. EXAM: CT CHEST WITHOUT CONTRAST TECHNIQUE: Multidetector CT imaging of the chest was performed following the standard protocol without IV contrast. COMPARISON:  03/28/2013 chest radiograph and 04/28/2012 chest CT. FINDINGS: Mediastinum/Nodes: Mild cardiomegaly. No pericardial fluid/thickening. Two lead right subclavian pacemaker is noted with lead tips in the right atrium and right ventricular apex. Left main and 3 vessel coronary atherosclerosis status post CABG with internal mammary and ascending aortic bypass grafts. Great vessels are normal in course and caliber. Atrophic appearing thyroid gland, unchanged. Normal esophagus. No pathologically enlarged axillary, mediastinal or gross hilar lymph nodes, noting limited sensitivity for the detection of hilar adenopathy on this noncontrast study. Lungs/Pleura: No pneumothorax. No pleural effusion. Right upper lobe 4 mm solid pulmonary nodule (series 4/ image 19) is stable since 04/28/2012 and benign. No acute consolidative airspace disease, new significant pulmonary nodules or lung masses. Subsegmental  scarring versus atelectasis in the dependent lower lobes. Upper abdomen: Unremarkable. Musculoskeletal: No aggressive appearing focal osseous lesions. Sternotomy wires appear aligned and intact. There is a fracture involving the superior endplate of the L1 vertebral body, with incomplete visualization of the L1 level on this study. Otherwise no fracture in the chest. Mild degenerative changes in the thoracic spine. IMPRESSION: 1. Fracture of the superior endplate of the L1 vertebral body, incompletely visualized on this chest CT study. Please see the separate concurrent lumbar spine CT report for further details. 2. Otherwise no acute traumatic injury in the chest. 3. Mild cardiomegaly.  No active pulmonary disease. Electronically Signed   By: Delbert Phenix M.D.   On: 05/26/2015 20:13   Ct Thoracic Spine Wo Contrast  05/26/2015  CLINICAL DATA:  Fall with hypoxia.  Initial encounter. EXAM: CT THORACIC SPINE WITHOUT CONTRAST TECHNIQUE: Multidetector CT imaging of the thoracic spine was performed without intravenous contrast administration. Multiplanar CT image reconstructions were also generated. COMPARISON:  None. FINDINGS: T12 is incompletely encompassed on this study but is visualized on lumbar spine CT performed at the same time. No evidence of fracture or subluxation. No visualized canal hematoma or disc herniation. Degenerative changes are mild for age. No thoracic impingement suspected. No posttraumatic findings seen in the visualized intrathoracic cavity. Correlate with dedicated chest CT. IMPRESSION: No evidence of thoracic spine injury. Electronically Signed   By: Marnee Spring M.D.   On: 05/26/2015 20:08   Ct Lumbar Spine Wo Contrast  05/26/2015  CLINICAL DATA:  Fall with hypoxia. EXAM: CT LUMBAR SPINE WITHOUT CONTRAST TECHNIQUE: Multidetector CT imaging of the lumbar spine was performed without intravenous contrast administration. Multiplanar CT image reconstructions were also generated. COMPARISON:   04/1912 abdominal CT FINDINGS: L1 vertebral body fracture reaching both superior and inferior endplates but with predominantly horizontal upper body fracture plane. Height loss is less than 25% and there is no retropulsion or pedicular involvement. No other fracture is seen. No subluxation. Mild degenerative changes for age including lumbar facet spurring from L3-4 to the sacrum. Lower lumbar disc bulges. No posttraumatic finding in the visualized retroperitoneum. IMPRESSION: L1 compression fracture with mild height loss and no retropulsion. Electronically Signed   By:  Marnee Spring M.D.   On: 05/26/2015 20:05   Dg Pelvis Portable  05/26/2015  CLINICAL DATA:  Acute onset of centralized lower back pain after fall. Initial encounter. EXAM: PORTABLE PELVIS 1-2 VIEWS COMPARISON:  CTA of the chest, abdomen and pelvis performed 04/28/2012 FINDINGS: There is no evidence of fracture or dislocation. Both femoral heads are seated normally within their respective acetabula. Mild degenerative change is noted at the sacroiliac joints. The visualized bowel gas pattern is grossly unremarkable in appearance. Scattered vascular calcifications are seen. IMPRESSION: No evidence of fracture or dislocation. Electronically Signed   By: Roanna Raider M.D.   On: 05/26/2015 18:34   Dg Chest Portable 1 View  05/26/2015  CLINICAL DATA:  Patient with low back pain status post fall. EXAM: PORTABLE CHEST 1 VIEW COMPARISON:  Chest radiograph 03/28/2013. FINDINGS: Multi lead pacer apparatus overlies the right hemi thorax, leads are stable in position. Stable cardiac and mediastinal contours. Tortuosity and calcification of the thoracic aorta. Markedly low lung volumes. No large area pulmonary consolidation. No pleural effusion or pneumothorax. Osseous skeleton is unremarkable. IMPRESSION: No acute cardiopulmonary process.  Low lung volumes. Electronically Signed   By: Annia Belt M.D.   On: 05/26/2015 18:31    EKG: Independently  reviewed. A-V Paced Rhythm  Assessment/Plan Active Problems:   Hypothyroidism   Diet-controlled diabetes mellitus (HCC)   Hypertensive cardiovascular disease   Hypoxic   Compression fracture   Admit for observation, telemetry  Acute L1 compression fracture with mild height loss and no retropulsion; intractable back pain --Should not need surgery but can consider IR or ortho referral in the AM --PT eval and treat --Analgesics and muscle relaxers as needed --Fall precautions  Hypoxia, etiology unclear.  Could be driven by pain, narcotics.  Low clinical index of suspicion for ACS but need to rule out PE --D-Dimer pending.  If positive, will need CTA chest PE protocol --Can cycle troponin but low suspicion --Oxygen as needed  HTN --Continue home meds  Diet controlled DM --Carb controlled diet, SSI AC/HS if needed  Code Status: FULL Family Communication: Daughter at bedside Disposition Plan: Observation for now; monitor response to therapy  Time spent: 35 minutes  [employer] [facility]  05/26/2015, 11:58 PM

## 2015-05-26 NOTE — ED Notes (Signed)
Dr. Carter at bedside.

## 2015-05-26 NOTE — ED Notes (Signed)
Pt here for fall. Pt feell on the last step. No LOC and didn't hit head. Pt having lower back pain. Pt on plavix. Pt passed c spine. 20 RFA. 50 fentanyl. Pain 5/10. BP 170/86 pulse 90, 99RA

## 2015-05-26 NOTE — ED Notes (Signed)
Patient with low oxygen sats off oxygen.  Sats drifting down to 88% and back to 94%.

## 2015-05-27 ENCOUNTER — Observation Stay (HOSPITAL_BASED_OUTPATIENT_CLINIC_OR_DEPARTMENT_OTHER): Payer: Medicare Other

## 2015-05-27 ENCOUNTER — Encounter (HOSPITAL_COMMUNITY): Payer: Self-pay | Admitting: General Practice

## 2015-05-27 ENCOUNTER — Observation Stay (HOSPITAL_COMMUNITY): Payer: Medicare Other

## 2015-05-27 DIAGNOSIS — S32010A Wedge compression fracture of first lumbar vertebra, initial encounter for closed fracture: Secondary | ICD-10-CM | POA: Diagnosis not present

## 2015-05-27 DIAGNOSIS — I110 Hypertensive heart disease with heart failure: Secondary | ICD-10-CM | POA: Diagnosis not present

## 2015-05-27 DIAGNOSIS — R791 Abnormal coagulation profile: Secondary | ICD-10-CM

## 2015-05-27 LAB — CBC
HCT: 31.8 % — ABNORMAL LOW (ref 36.0–46.0)
HEMATOCRIT: 32.1 % — AB (ref 36.0–46.0)
HEMOGLOBIN: 11 g/dL — AB (ref 12.0–15.0)
Hemoglobin: 10.8 g/dL — ABNORMAL LOW (ref 12.0–15.0)
MCH: 31.9 pg (ref 26.0–34.0)
MCH: 32.2 pg (ref 26.0–34.0)
MCHC: 34 g/dL (ref 30.0–36.0)
MCHC: 34.3 g/dL (ref 30.0–36.0)
MCV: 93.8 fL (ref 78.0–100.0)
MCV: 93.9 fL (ref 78.0–100.0)
PLATELETS: 184 10*3/uL (ref 150–400)
Platelets: 188 10*3/uL (ref 150–400)
RBC: 3.39 MIL/uL — AB (ref 3.87–5.11)
RBC: 3.42 MIL/uL — ABNORMAL LOW (ref 3.87–5.11)
RDW: 13.9 % (ref 11.5–15.5)
RDW: 14 % (ref 11.5–15.5)
WBC: 5.6 10*3/uL (ref 4.0–10.5)
WBC: 5.3 10*3/uL (ref 4.0–10.5)

## 2015-05-27 LAB — CREATININE, SERUM
CREATININE: 1.02 mg/dL — AB (ref 0.44–1.00)
GFR, EST AFRICAN AMERICAN: 55 mL/min — AB (ref >60)
GFR, EST NON AFRICAN AMERICAN: 47 mL/min — AB (ref >60)

## 2015-05-27 LAB — BASIC METABOLIC PANEL
ANION GAP: 10 (ref 5–15)
BUN: 10 mg/dL (ref 6–20)
CALCIUM: 8.7 mg/dL — AB (ref 8.9–10.3)
CO2: 28 mmol/L (ref 22–32)
CREATININE: 0.94 mg/dL (ref 0.44–1.00)
Chloride: 98 mmol/L — ABNORMAL LOW (ref 101–111)
GFR, EST NON AFRICAN AMERICAN: 52 mL/min — AB (ref >60)
GLUCOSE: 139 mg/dL — AB (ref 65–99)
Potassium: 3.5 mmol/L (ref 3.5–5.1)
Sodium: 136 mmol/L (ref 135–145)

## 2015-05-27 LAB — GLUCOSE, CAPILLARY
GLUCOSE-CAPILLARY: 105 mg/dL — AB (ref 65–99)
GLUCOSE-CAPILLARY: 89 mg/dL (ref 65–99)
Glucose-Capillary: 105 mg/dL — ABNORMAL HIGH (ref 65–99)
Glucose-Capillary: 84 mg/dL (ref 65–99)

## 2015-05-27 LAB — D-DIMER, QUANTITATIVE (NOT AT ARMC): D DIMER QUANT: 11.59 ug{FEU}/mL — AB (ref 0.00–0.50)

## 2015-05-27 MED ORDER — CYCLOBENZAPRINE HCL 5 MG PO TABS
5.0000 mg | ORAL_TABLET | Freq: Two times a day (BID) | ORAL | Status: DC | PRN
Start: 2015-05-27 — End: 2015-05-29

## 2015-05-27 MED ORDER — AMLODIPINE BESYLATE 5 MG PO TABS
5.0000 mg | ORAL_TABLET | Freq: Every day | ORAL | Status: DC
  Administered 2015-05-27 – 2015-05-29 (×3): 5 mg via ORAL
  Filled 2015-05-27 (×3): qty 1

## 2015-05-27 MED ORDER — RANOLAZINE ER 500 MG PO TB12
1000.0000 mg | ORAL_TABLET | Freq: Two times a day (BID) | ORAL | Status: DC
  Administered 2015-05-27 – 2015-05-29 (×5): 1000 mg via ORAL
  Filled 2015-05-27 (×5): qty 2

## 2015-05-27 MED ORDER — POTASSIUM CHLORIDE CRYS ER 20 MEQ PO TBCR
20.0000 meq | EXTENDED_RELEASE_TABLET | Freq: Every evening | ORAL | Status: DC
  Administered 2015-05-27 – 2015-05-28 (×2): 20 meq via ORAL
  Filled 2015-05-27 (×3): qty 1

## 2015-05-27 MED ORDER — ASPIRIN EC 81 MG PO TBEC
81.0000 mg | DELAYED_RELEASE_TABLET | Freq: Every day | ORAL | Status: DC
  Administered 2015-05-27 – 2015-05-29 (×3): 81 mg via ORAL
  Filled 2015-05-27 (×3): qty 1

## 2015-05-27 MED ORDER — METOPROLOL SUCCINATE ER 100 MG PO TB24
100.0000 mg | ORAL_TABLET | Freq: Every day | ORAL | Status: DC
  Administered 2015-05-27 – 2015-05-29 (×3): 100 mg via ORAL
  Filled 2015-05-27 (×3): qty 1

## 2015-05-27 MED ORDER — ACETAMINOPHEN 325 MG PO TABS: 650.0000 mg | ORAL_TABLET | Freq: Four times a day (QID) | ORAL | Status: DC | PRN

## 2015-05-27 MED ORDER — MECLIZINE HCL 25 MG PO TABS: 12.5000 mg | ORAL_TABLET | Freq: Three times a day (TID) | ORAL | Status: DC | PRN

## 2015-05-27 MED ORDER — CLOPIDOGREL BISULFATE 75 MG PO TABS
75.0000 mg | ORAL_TABLET | Freq: Every day | ORAL | Status: DC
  Administered 2015-05-27 – 2015-05-29 (×3): 75 mg via ORAL
  Filled 2015-05-27 (×3): qty 1

## 2015-05-27 MED ORDER — ENOXAPARIN SODIUM 60 MG/0.6ML ~~LOC~~ SOLN
50.0000 mg | Freq: Two times a day (BID) | SUBCUTANEOUS | Status: DC
  Administered 2015-05-27 (×2): 50 mg via SUBCUTANEOUS
  Filled 2015-05-27 (×2): qty 0.6

## 2015-05-27 MED ORDER — ONDANSETRON HCL 4 MG PO TABS: 4.0000 mg | ORAL_TABLET | Freq: Four times a day (QID) | ORAL | Status: DC | PRN

## 2015-05-27 MED ORDER — ISOSORBIDE MONONITRATE ER 60 MG PO TB24
120.0000 mg | ORAL_TABLET | Freq: Every day | ORAL | Status: DC
  Administered 2015-05-27 – 2015-05-29 (×3): 120 mg via ORAL
  Filled 2015-05-27 (×3): qty 2

## 2015-05-27 MED ORDER — INSULIN ASPART 100 UNIT/ML ~~LOC~~ SOLN
0.0000 [IU] | Freq: Three times a day (TID) | SUBCUTANEOUS | Status: DC
  Administered 2015-05-28: 1 [IU] via SUBCUTANEOUS

## 2015-05-27 MED ORDER — ONDANSETRON HCL 4 MG/2ML IJ SOLN
4.0000 mg | Freq: Four times a day (QID) | INTRAMUSCULAR | Status: DC | PRN
  Administered 2015-05-28: 4 mg via INTRAVENOUS
  Filled 2015-05-27: qty 2

## 2015-05-27 MED ORDER — ENOXAPARIN SODIUM 30 MG/0.3ML ~~LOC~~ SOLN: 30.0000 mg | SUBCUTANEOUS | Status: DC

## 2015-05-27 MED ORDER — ACETAMINOPHEN 650 MG RE SUPP: 650.0000 mg | Freq: Four times a day (QID) | RECTAL | Status: DC | PRN

## 2015-05-27 MED ORDER — LEVOTHYROXINE SODIUM 50 MCG PO TABS
50.0000 ug | ORAL_TABLET | Freq: Every day | ORAL | Status: DC
  Administered 2015-05-27 – 2015-05-29 (×3): 50 ug via ORAL
  Filled 2015-05-27 (×3): qty 1

## 2015-05-27 MED ORDER — CALCIUM CARBONATE-VITAMIN D 500-200 MG-UNIT PO TABS
1.0000 | ORAL_TABLET | Freq: Every day | ORAL | Status: DC
  Administered 2015-05-27 – 2015-05-29 (×3): 1 via ORAL
  Filled 2015-05-27 (×3): qty 1

## 2015-05-27 MED ORDER — ATORVASTATIN CALCIUM 20 MG PO TABS
20.0000 mg | ORAL_TABLET | Freq: Every day | ORAL | Status: DC
  Administered 2015-05-27 – 2015-05-29 (×3): 20 mg via ORAL
  Filled 2015-05-27 (×3): qty 1

## 2015-05-27 MED ORDER — SODIUM CHLORIDE 0.9% FLUSH
3.0000 mL | Freq: Two times a day (BID) | INTRAVENOUS | Status: DC
  Administered 2015-05-27 – 2015-05-29 (×6): 3 mL via INTRAVENOUS

## 2015-05-27 MED ORDER — FUROSEMIDE 40 MG PO TABS
40.0000 mg | ORAL_TABLET | Freq: Every day | ORAL | Status: DC
  Administered 2015-05-27: 40 mg via ORAL
  Filled 2015-05-27: qty 1

## 2015-05-27 MED ORDER — OXYCODONE HCL 5 MG PO TABS
5.0000 mg | ORAL_TABLET | ORAL | Status: DC | PRN
  Administered 2015-05-27 – 2015-05-29 (×3): 5 mg via ORAL
  Filled 2015-05-27 (×3): qty 1

## 2015-05-27 MED ORDER — POTASSIUM CHLORIDE CRYS ER 20 MEQ PO TBCR
20.0000 meq | EXTENDED_RELEASE_TABLET | Freq: Once | ORAL | Status: AC
  Administered 2015-05-27: 20 meq via ORAL
  Filled 2015-05-27: qty 1

## 2015-05-27 MED ORDER — IOPAMIDOL (ISOVUE-370) INJECTION 76%
INTRAVENOUS | Status: AC
  Administered 2015-05-27: 100 mL
  Filled 2015-05-27: qty 100

## 2015-05-27 MED ORDER — MORPHINE SULFATE (PF) 2 MG/ML IV SOLN: 2.0000 mg | INTRAVENOUS | Status: DC | PRN

## 2015-05-27 MED ORDER — NITROGLYCERIN 0.4 MG/SPRAY TL SOLN
1.0000 | Status: DC | PRN
  Filled 2015-05-27: qty 4.9

## 2015-05-27 NOTE — Evaluation (Signed)
Physical Therapy Evaluation Patient Details Name: Sydney Moore MRN: [id number] DOB: 26-Jun-1923 Today's Date: 05/27/2015   History of Present Illness  Patient is an 82 yo female admitted 05/26/15 following a fall with L1 compression fracture.  Patient with elevated d-dimer - CT negative and dopplers negative.   PMH:  CAD, CABG, CHF, pacemaker, HTN, DM  Clinical Impression  Patient presents with problems listed below.  Will benefit from acute PT to maximize functional mobility prior to discharge.  Recommend HHPT for continued therapy at discharge.    Follow Up Recommendations Home health PT;Supervision/Assistance - 24 hour    Equipment Recommendations  Rolling walker with 5" wheels;3in1 (PT) (Equipment company delivered)    Recommendations for Other Services       Precautions / Restrictions Precautions Precautions: Fall Restrictions Weight Bearing Restrictions: No      Mobility  Bed Mobility Overal bed mobility: Needs Assistance Bed Mobility: Rolling;Sidelying to Sit;Sit to Sidelying Rolling: Supervision Sidelying to sit: Min guard     Sit to sidelying: Min assist General bed mobility comments: Verbal cues for log rolling technique.  Assist for safety to raise up to sitting position.  Max assist to don/doff back brace.  Assist to bring LE's onto bed to return to sidelying.  Transfers Overall transfer level: Needs assistance Equipment used: Rolling walker (2 wheeled) Transfers: Sit to/from Stand Sit to Stand: Min guard         General transfer comment: Verbal cues for hand placement and technique.  Assist for safety/balance.  Ambulation/Gait Ambulation/Gait assistance: Min guard Ambulation Distance (Feet): 62 Feet Assistive device: Rolling walker (2 wheeled) Gait Pattern/deviations: Step-through pattern;Decreased stride length Gait velocity: decreased Gait velocity interpretation: Below normal speed for age/gender General Gait Details: Verbal cues for safe use of  RW.  Patient with slow, guarded gait.  Stairs            Wheelchair Mobility    Modified Rankin (Stroke Patients Only)       Balance                                             Pertinent Vitals/Pain Pain Assessment: 0-10 Pain Score: 6  (during gait) Pain Location: back Pain Descriptors / Indicators: Aching;Sore Pain Intervention(s): Limited activity within patient's tolerance;Monitored during session;Repositioned    Home Living Family/patient expects to be discharged to:: Private residence (Going to daughter's home) Living Arrangements: Alone Available Help at Discharge: Family;Available 24 hours/day (Going to daughter's home - available 24*) Type of Home: House Home Access: Stairs to enter Entrance Stairs-Rails: None Entrance Stairs-Number of Steps: 1 Home Layout: Two level;Able to live on main level with bedroom/bathroom Home Equipment: None      Prior Function Level of Independence: Independent         Comments: Very active - to church 2x/week to exercise class     Hand Dominance        Extremity/Trunk Assessment   Upper Extremity Assessment: Overall WFL for tasks assessed           Lower Extremity Assessment: Generalized weakness         Communication   Communication: No difficulties  Cognition Arousal/Alertness: Awake/alert Behavior During Therapy: WFL for tasks assessed/performed Overall Cognitive Status: Within Functional Limits for tasks assessed  General Comments      Exercises        Assessment/Plan    PT Assessment Patient needs continued PT services  PT Diagnosis Difficulty walking;Generalized weakness;Acute pain   PT Problem List Decreased strength;Decreased activity tolerance;Decreased balance;Decreased mobility;Decreased knowledge of use of DME;Decreased knowledge of precautions;Pain  PT Treatment Interventions DME instruction;Gait training;Functional mobility  training;Therapeutic activities;Patient/family education   PT Goals (Current goals can be found in the Care Plan section) Acute Rehab PT Goals Patient Stated Goal: To decrease pain PT Goal Formulation: With patient Time For Goal Achievement: 06/03/15 Potential to Achieve Goals: Good    Frequency Min 5X/week   Barriers to discharge   Patient going to daughter's home for 24* assist.    Co-evaluation               End of Session Equipment Utilized During Treatment: Gait belt;Back brace Activity Tolerance: Patient limited by pain Patient left: in bed;with call bell/phone within reach;with family/visitor present Nurse Communication: Mobility status    Functional Assessment Tool Used: Clinical judgement Functional Limitation: Mobility: Walking and moving around Mobility: Walking and Moving Around Current Status [phone]): At least 20 percent but less than 40 percent impaired, limited or restricted Mobility: Walking and Moving Around Goal Status [phone]): At least 1 percent but less than 20 percent impaired, limited or restricted    Time: 1515-1531 PT Time Calculation (min) (ACUTE ONLY): 16 min   Charges:   PT Evaluation $PT Eval Moderate Complexity: 1 Procedure     PT G Codes:   PT G-Codes **NOT FOR INPATIENT CLASS** Functional Assessment Tool Used: Clinical judgement Functional Limitation: Mobility: Walking and moving around Mobility: Walking and Moving Around Current Status ([id number]): At least 20 percent but less than 40 percent impaired, limited or restricted Mobility: Walking and Moving Around Goal Status [phone]): At least 1 percent but less than 20 percent impaired, limited or restricted    Vena Austria 05/27/2015, 3:58 PM Durenda Hurt. Renaldo Fiddler, [facility] Acute Rehab Services Pager [phone]

## 2015-05-27 NOTE — Progress Notes (Signed)
Orthopedic Tech Progress Note Patient Details:  Sydney Moore 1923/02/25 [id number] Called in bio-tech brace order; spoke with Darvin Neighbours, Kolden Dupee 05/27/2015, 9:15 AM

## 2015-05-27 NOTE — Progress Notes (Addendum)
PROGRESS NOTE                                                                                                                                                                                                             Patient Demographics:    Sydney Moore, is a 82 y.o. female, DOB - Aug 12, 1923, EBR:[id number]  Admit date - 05/26/2015   Admitting Physician Michael Litter, MD  Outpatient Primary MD for the patient is Myrlene Broker, MD  LOS -   Outpatient Specialists:   Chief Complaint  Patient presents with  . Fall       Brief Narrative      Subjective:    Sydney Moore today has, No headache, No chest pain, No abdominal pain - No Nausea, No new weakness tingling or numbness, No Cough - SOB. Is having ongoing low back pain which is nonradiating   Assessment  & Plan :     1.Mechanical fall with L1 compression fracture without retropulsion. Discussed her case with neurosurgeon on call Dr. Lelon Perla, for now TLSO brace, supportive care, follow in 2-3 weeks with Dr. Dutch Quint, PT eval. If stable discharge in the morning.  2. Severely elevated d-dimer. CT angiogram chest unremarkable, lower extremity venous duplex will be obtained. She already received therapeutic Lovenox 1 dose.  3.Hypothyroidism. On Synthroid continue.  4. Dyslipidemia. On statin.  5. CAD. Chest pain-free. Continue combination of Ranexa, Imdur, aspirin, Plavix, statin and beta blocker for secondary prevention.  6. Hypoxia upon admission. Completely resolved, likely was unable to take deep breaths due to and an L-spine fracture, symptom free this morning. Titrate oxygen off. CT angiogram chest unremarkable.  7. Chronic combined systolic and diastolic heart failure. Echogram 40%. Compensated, continue home medications which include beta blocker and Lasix.   8. Diet-controlled Pre DM. Sliding scale for now.  Lab Results  Component Value Date    HGBA1C 6.0 01/01/2015   CBG (last 3)   Recent Labs  05/27/15 0812 05/27/15 1158  GLUCAP 105* 105*     Code Status : Full  Family Communication  : None present  Disposition Plan  : Home with home health likely tomorrow  Barriers For Discharge : PT evaluation, lower extremity venous duplex  Consults  :  Dr. Lelon Perla neurosurgeon over the phone  Procedures  : CT chest, T-spine,  L-spine. On acute except L1 compression fracture  DVT Prophylaxis  :  Lovenox    Lab Results  Component Value Date   PLT 188 05/27/2015   PLT 184 05/27/2015    Antibiotics  :    Anti-infectives    None        Objective:   Filed Vitals:   05/27/15 0301 05/27/15 0339 05/27/15 0455 05/27/15 0939  BP:   148/47 133/51  Pulse:   60 63  Temp:   98.2 F (36.8 C) 98.6 F (37 C)  TempSrc:   Oral Oral  Resp:   21 22  Weight: 56 kg (123 lb 7.3 oz) 56.2 kg (123 lb 14.4 oz)    SpO2:   97% 98%    Wt Readings from Last 3 Encounters:  05/27/15 56.2 kg (123 lb 14.4 oz)  04/30/15 56.586 kg (124 lb 12 oz)  03/29/15 58.06 kg (128 lb)     Intake/Output Summary (Last 24 hours) at 05/27/15 1208 Last data filed at 05/27/15 0900  Gross per 24 hour  Intake    120 ml  Output      0 ml  Net    120 ml     Physical Exam  Awake Alert, Oriented X 3, No new F.N deficits, Normal affect [place].AT,PERRAL Supple Neck,No JVD, No cervical lymphadenopathy appriciated.  Symmetrical Chest wall movement, Good air movement bilaterally, CTAB RRR,No Gallops,Rubs or new Murmurs, No Parasternal Heave +ve B.Sounds, Abd Soft, No tenderness, No organomegaly appriciated, No rebound - guarding or rigidity. No Cyanosis, Clubbing or edema, No new Rash or bruise       Data Review:    CBC  Recent Labs Lab 05/26/15 2013 05/27/15 0205  WBC 5.8 5.3  5.6  HGB 10.4* 10.8*  11.0*  HCT 30.3* 31.8*  32.1*  PLT 173 184  188  MCV 95.0 93.8  93.9  MCH 32.6 31.9  32.2  MCHC 34.3 34.0  34.3  RDW 14.1 13.9  14.0   LYMPHSABS 0.9  --   MONOABS 0.7  --   EOSABS 0.0  --   BASOSABS 0.0  --     Chemistries   Recent Labs Lab 05/26/15 2013 05/27/15 0205  NA 136 136  K 3.3* 3.5  CL 101 98*  CO2 26 28  GLUCOSE 113* 139*  BUN 12 10  CREATININE 0.91 0.94  1.02*  CALCIUM 8.6* 8.7*  AST 39  --   ALT 19  --   ALKPHOS 61  --   BILITOT 0.9  --    ------------------------------------------------------------------------------------------------------------------ No results for input(s): CHOL, HDL, LDLCALC, TRIG, CHOLHDL, LDLDIRECT in the last 72 hours.  Lab Results  Component Value Date   HGBA1C 6.0 01/01/2015   ------------------------------------------------------------------------------------------------------------------ No results for input(s): TSH, T4TOTAL, T3FREE, THYROIDAB in the last 72 hours.  Invalid input(s): FREET3 ------------------------------------------------------------------------------------------------------------------ No results for input(s): VITAMINB12, FOLATE, FERRITIN, TIBC, IRON, RETICCTPCT in the last 72 hours.  Coagulation profile No results for input(s): INR, PROTIME in the last 168 hours.   Recent Labs  05/27/15 0205  DDIMER 11.59*    Cardiac Enzymes No results for input(s): CKMB, TROPONINI, MYOGLOBIN in the last 168 hours.  Invalid input(s): CK ------------------------------------------------------------------------------------------------------------------    Component Value Date/Time   BNP 665.9* 05/26/2015 2014    Inpatient Medications  Scheduled Meds: . amLODipine  5 mg Oral Daily  . aspirin EC  81 mg Oral Daily  . atorvastatin  20 mg Oral Daily  . calcium-vitamin D  1 tablet  Oral Daily  . clopidogrel  75 mg Oral Daily  . enoxaparin (LOVENOX) injection  50 mg Subcutaneous Q12H  . furosemide  40 mg Oral Daily  . insulin aspart  0-9 Units Subcutaneous TID WC  . isosorbide mononitrate  120 mg Oral Daily  . levothyroxine  50 mcg Oral QAC  breakfast  . metoprolol succinate  100 mg Oral Daily  . potassium chloride SA  20 mEq Oral QPM  . ranolazine  1,000 mg Oral BID  . sodium chloride flush  3 mL Intravenous Q12H   Continuous Infusions:  PRN Meds:.acetaminophen **OR** acetaminophen, cyclobenzaprine, meclizine, morphine injection, nitroGLYCERIN, ondansetron **OR** ondansetron (ZOFRAN) IV, oxyCODONE  Micro Results No results found for this or any previous visit (from the past 240 hour(s)).  Radiology Reports Ct Chest Wo Contrast  05/26/2015  CLINICAL DATA:  Fall.  Hypoxia. EXAM: CT CHEST WITHOUT CONTRAST TECHNIQUE: Multidetector CT imaging of the chest was performed following the standard protocol without IV contrast. COMPARISON:  03/28/2013 chest radiograph and 04/28/2012 chest CT. FINDINGS: Mediastinum/Nodes: Mild cardiomegaly. No pericardial fluid/thickening. Two lead right subclavian pacemaker is noted with lead tips in the right atrium and right ventricular apex. Left main and 3 vessel coronary atherosclerosis status post CABG with internal mammary and ascending aortic bypass grafts. Great vessels are normal in course and caliber. Atrophic appearing thyroid gland, unchanged. Normal esophagus. No pathologically enlarged axillary, mediastinal or gross hilar lymph nodes, noting limited sensitivity for the detection of hilar adenopathy on this noncontrast study. Lungs/Pleura: No pneumothorax. No pleural effusion. Right upper lobe 4 mm solid pulmonary nodule (series 4/ image 19) is stable since 04/28/2012 and benign. No acute consolidative airspace disease, new significant pulmonary nodules or lung masses. Subsegmental scarring versus atelectasis in the dependent lower lobes. Upper abdomen: Unremarkable. Musculoskeletal: No aggressive appearing focal osseous lesions. Sternotomy wires appear aligned and intact. There is a fracture involving the superior endplate of the L1 vertebral body, with incomplete visualization of the L1 level on this  study. Otherwise no fracture in the chest. Mild degenerative changes in the thoracic spine. IMPRESSION: 1. Fracture of the superior endplate of the L1 vertebral body, incompletely visualized on this chest CT study. Please see the separate concurrent lumbar spine CT report for further details. 2. Otherwise no acute traumatic injury in the chest. 3. Mild cardiomegaly.  No active pulmonary disease. Electronically Signed   By: Delbert Phenix M.D.   On: 05/26/2015 20:13   Ct Angio Chest Pe W/cm &/or Wo Cm  05/27/2015  CLINICAL DATA:  Elevated D-dimer EXAM: CT ANGIOGRAPHY CHEST WITH CONTRAST TECHNIQUE: Multidetector CT imaging of the chest was performed using the standard protocol during bolus administration of intravenous contrast. Multiplanar CT image reconstructions and MIPs were obtained to evaluate the vascular anatomy. CONTRAST:  80 mL Isovue 370 intravenous COMPARISON:  Radiographs 05/26/2015 FINDINGS: Cardiovascular: There is good opacification of the pulmonary arteries. There is no pulmonary embolism. The thoracic aorta is normal in caliber and intact. Lungs: Clear Central airways: Patent Effusions: None Lymphadenopathy: None Esophagus: Unremarkable Upper abdomen: Unremarkable Musculoskeletal: No significant abnormality. Review of the MIP images confirms the above findings. IMPRESSION: Negative for acute pulmonary embolism. No significant abnormality in the chest. Electronically Signed   By: Ellery Plunk M.D.   On: 05/27/2015 04:45   Ct Thoracic Spine Wo Contrast  05/26/2015  CLINICAL DATA:  Fall with hypoxia.  Initial encounter. EXAM: CT THORACIC SPINE WITHOUT CONTRAST TECHNIQUE: Multidetector CT imaging of the thoracic spine was performed without intravenous contrast  administration. Multiplanar CT image reconstructions were also generated. COMPARISON:  None. FINDINGS: T12 is incompletely encompassed on this study but is visualized on lumbar spine CT performed at the same time. No evidence of fracture  or subluxation. No visualized canal hematoma or disc herniation. Degenerative changes are mild for age. No thoracic impingement suspected. No posttraumatic findings seen in the visualized intrathoracic cavity. Correlate with dedicated chest CT. IMPRESSION: No evidence of thoracic spine injury. Electronically Signed   By: Marnee Spring M.D.   On: 05/26/2015 20:08   Ct Lumbar Spine Wo Contrast  05/26/2015  CLINICAL DATA:  Fall with hypoxia. EXAM: CT LUMBAR SPINE WITHOUT CONTRAST TECHNIQUE: Multidetector CT imaging of the lumbar spine was performed without intravenous contrast administration. Multiplanar CT image reconstructions were also generated. COMPARISON:  04/1912 abdominal CT FINDINGS: L1 vertebral body fracture reaching both superior and inferior endplates but with predominantly horizontal upper body fracture plane. Height loss is less than 25% and there is no retropulsion or pedicular involvement. No other fracture is seen. No subluxation. Mild degenerative changes for age including lumbar facet spurring from L3-4 to the sacrum. Lower lumbar disc bulges. No posttraumatic finding in the visualized retroperitoneum. IMPRESSION: L1 compression fracture with mild height loss and no retropulsion. Electronically Signed   By: Marnee Spring M.D.   On: 05/26/2015 20:05   Dg Pelvis Portable  05/26/2015  CLINICAL DATA:  Acute onset of centralized lower back pain after fall. Initial encounter. EXAM: PORTABLE PELVIS 1-2 VIEWS COMPARISON:  CTA of the chest, abdomen and pelvis performed 04/28/2012 FINDINGS: There is no evidence of fracture or dislocation. Both femoral heads are seated normally within their respective acetabula. Mild degenerative change is noted at the sacroiliac joints. The visualized bowel gas pattern is grossly unremarkable in appearance. Scattered vascular calcifications are seen. IMPRESSION: No evidence of fracture or dislocation. Electronically Signed   By: Roanna Raider M.D.   On: 05/26/2015  18:34   Dg Chest Portable 1 View  05/26/2015  CLINICAL DATA:  Patient with low back pain status post fall. EXAM: PORTABLE CHEST 1 VIEW COMPARISON:  Chest radiograph 03/28/2013. FINDINGS: Multi lead pacer apparatus overlies the right hemi thorax, leads are stable in position. Stable cardiac and mediastinal contours. Tortuosity and calcification of the thoracic aorta. Markedly low lung volumes. No large area pulmonary consolidation. No pleural effusion or pneumothorax. Osseous skeleton is unremarkable. IMPRESSION: No acute cardiopulmonary process.  Low lung volumes. Electronically Signed   By: Annia Belt M.D.   On: 05/26/2015 18:31    Time Spent in minutes  25   SINGH,PRASHANT K M.D on 05/27/2015 at 12:08 PM  Between 7am to 7pm - Pager - [phone]  After 7pm go to [email or web address] - password [email or web address]  [facility] -  Office  [phone]

## 2015-05-27 NOTE — Progress Notes (Signed)
VASCULAR LAB PRELIMINARY  PRELIMINARY  PRELIMINARY  PRELIMINARY  Bilateral lower extremity venous duplex completed.    Preliminary report:  Bilateral:  No evidence of DVT, superficial thrombosis, or Baker's Cyst.  Blood flow is sluggish bilaterally.  Jenetta Loges, RVT, RDMS 05/27/2015, 2:33 PM

## 2015-05-27 NOTE — Care Management Note (Signed)
Case Management Note  Patient Details  Name: Sydney Moore MRN: [id number] Date of Birth: 09-04-1923  Subjective/Objective:                  Back pain after mechanical fall Action/Plan: CM met with pt and pt's daughter, Sydney Moore [phone] [address], [place], [place] [address]. Pt will be going home to the aforementioned address with her daughter, Sydney Moore.  Family chooses [facility] to render HHPT/Aide/SW.  Referral called to Santiago Glad of [facility]  CM called [facility] rep, Merry Proud to please deliver the rolling walker and 3n1 to room.  No other CM needs were communicated. Expected Discharge Date:                  Expected Discharge Plan:  [place]  In-House Referral:     Discharge planning Services  CM Consult  Post Acute Care Choice:  Home Health Choice offered to:  Patient, Adult Children  DME Arranged:  3-N-1, Walker rolling DME Agency:  [place]:  PT, Nurse's Aide, Social Work [employer] Agency:  [place]  Status of Service:  Completed, signed off  Medicare Important Message Given:    Date Medicare IM Given:    Medicare IM give by:    Date Additional Medicare IM Given:    Additional Medicare Important Message give by:     If discussed at [place] of Stay Meetings, dates discussed:    Additional Comments:  Dellie Catholic, RN 05/27/2015, 1:07 PM

## 2015-05-28 DIAGNOSIS — E038 Other specified hypothyroidism: Secondary | ICD-10-CM | POA: Diagnosis not present

## 2015-05-28 DIAGNOSIS — S32010A Wedge compression fracture of first lumbar vertebra, initial encounter for closed fracture: Secondary | ICD-10-CM | POA: Diagnosis not present

## 2015-05-28 DIAGNOSIS — R0902 Hypoxemia: Secondary | ICD-10-CM | POA: Diagnosis not present

## 2015-05-28 DIAGNOSIS — E119 Type 2 diabetes mellitus without complications: Secondary | ICD-10-CM | POA: Diagnosis not present

## 2015-05-28 DIAGNOSIS — T148 Other injury of unspecified body region: Secondary | ICD-10-CM | POA: Diagnosis not present

## 2015-05-28 LAB — GLUCOSE, CAPILLARY
GLUCOSE-CAPILLARY: 94 mg/dL (ref 65–99)
GLUCOSE-CAPILLARY: 98 mg/dL (ref 65–99)
GLUCOSE-CAPILLARY: 89 mg/dL (ref 65–99)
Glucose-Capillary: 128 mg/dL — ABNORMAL HIGH (ref 65–99)

## 2015-05-28 MED ORDER — FUROSEMIDE 40 MG PO TABS
40.0000 mg | ORAL_TABLET | Freq: Every day | ORAL | Status: DC
  Filled 2015-05-28: qty 1

## 2015-05-28 MED ORDER — FUROSEMIDE 10 MG/ML IJ SOLN
20.0000 mg | Freq: Once | INTRAMUSCULAR | Status: AC
  Administered 2015-05-28: 20 mg via INTRAVENOUS
  Filled 2015-05-28: qty 2

## 2015-05-28 MED ORDER — OXYCODONE HCL 5 MG PO TABS: 5.0000 mg | ORAL_TABLET | Freq: Four times a day (QID) | ORAL | Status: DC | PRN

## 2015-05-28 MED ORDER — POTASSIUM CHLORIDE CRYS ER 20 MEQ PO TBCR
40.0000 meq | EXTENDED_RELEASE_TABLET | Freq: Once | ORAL | Status: AC
  Administered 2015-05-28: 40 meq via ORAL
  Filled 2015-05-28: qty 2

## 2015-05-28 MED ORDER — BISACODYL 5 MG PO TBEC
10.0000 mg | DELAYED_RELEASE_TABLET | Freq: Every day | ORAL | Status: DC
  Administered 2015-05-28: 10 mg via ORAL
  Filled 2015-05-28 (×2): qty 2

## 2015-05-28 MED ORDER — FUROSEMIDE 10 MG/ML IJ SOLN
40.0000 mg | Freq: Once | INTRAMUSCULAR | Status: AC
  Administered 2015-05-28: 40 mg via INTRAVENOUS
  Filled 2015-05-28: qty 4

## 2015-05-28 MED ORDER — BISACODYL 5 MG PO TBEC: 5.0000 mg | DELAYED_RELEASE_TABLET | Freq: Every day | ORAL | Status: DC | PRN

## 2015-05-28 MED ORDER — ENOXAPARIN SODIUM 30 MG/0.3ML ~~LOC~~ SOLN
30.0000 mg | SUBCUTANEOUS | Status: DC
  Administered 2015-05-28 – 2015-05-29 (×2): 30 mg via SUBCUTANEOUS
  Filled 2015-05-28 (×2): qty 0.3

## 2015-05-28 NOTE — Progress Notes (Signed)
Patient required O2 n/c on night shift at bedtime. It was taken off this morning prior to shift change. During AM vitals, pt's O2 sats were mid 80's and patient had coarse crackles bilaterally. Patient was placed on 2L O2 n/c; sats increased to 94% on 2L. Dr. Thedore Mins was notified. Orders for IV lasix received, patient back on RA, and to recheck in about 2 hours. Patient received lasix at 0902. Patient voided 400cc urine at 1030. O2 sats 84% on RA and decreased to 80% on RA after ambulating; patient c/o SOB at this time. Pt placed back on 2L and O2 sats increased to 94%. Dr. Thedore Mins notified. Orders received for incentive spirometry and to continue to monitor.  Leanna Battles, RN.

## 2015-05-28 NOTE — Progress Notes (Signed)
PROGRESS NOTE                                                                                                                                                                                                             Patient Demographics:    Sydney Moore, is a 82 y.o. female, DOB - 10-Mar-1923, ONG:[id number]  Admit date - 05/26/2015   Admitting Physician Michael Litter, MD  Outpatient Primary MD for the patient is Myrlene Broker, MD  LOS -   Outpatient Specialists:   Chief Complaint  Patient presents with  . Fall       Brief Narrative      Subjective:    Sydney Moore today has, No headache, No chest pain, No abdominal pain - No Nausea, No new weakness tingling or numbness, No Cough - SOB. Is having ongoing low back pain which is nonradiating   Assessment  & Plan :     1.Mechanical fall with L1 compression fracture without retropulsion. Discussed her case with neurosurgeon on call Dr. Lelon Perla, for now TLSO brace, supportive care, follow in 2-3 weeks with Dr. Dutch Quint, PT eval. If stable discharge in the morning.  2. Severely elevated d-dimer. CT angiogram chest unremarkable, lower extremity venous duplex will be obtained. She already received therapeutic Lovenox 1 dose.  3.Hypothyroidism. On Synthroid continue.  4. Dyslipidemia. On statin.  5. CAD. Chest pain-free. Continue combination of Ranexa, Imdur, aspirin, Plavix, statin and beta blocker for secondary prevention.  6. Hypoxia upon admission. Completely resolved, likely was unable to take deep breaths due to and an L-spine fracture, symptom free this morning. Titrate oxygen off. CT angiogram chest unremarkable.  7. Mild Acute on Chronic combined systolic and diastolic heart failure. Echogram 40%. Likely due to IVF in the ER, BNP high, rales on exam, Hypoxic, IV Lasix + B blocker and Imdur, monitor. May need home o2.  8. Diet-controlled Pre DM. Sliding  scale for now.  Lab Results  Component Value Date   HGBA1C 6.0 01/01/2015   CBG (last 3)   Recent Labs  05/27/15 1622 05/27/15 2237 05/28/15 0805  GLUCAP 89 84 94     Code Status : Full  Family Communication  : None present  Disposition Plan  : Home with home health likely in am  Barriers For Discharge : CHF  Consults  :  Dr. Julio Sicks neurosurgeon over the phone  Procedures  :   CT chest, T-spine, L-spine. On acute except L1 compression fracture  Bilateral lower extremity venous duplex . Preliminary report: Bilateral: No evidence of DVT, superficial thrombosis, or Baker's Cyst.  DVT Prophylaxis  :  Lovenox    Lab Results  Component Value Date   PLT 188 05/27/2015   PLT 184 05/27/2015    Antibiotics  :    Anti-infectives    None        Objective:   Filed Vitals:   05/28/15 0630 05/28/15 0811 05/28/15 1030 05/28/15 1035  BP: 127/49 131/49    Pulse: 61 61    Temp: 98.5 F (36.9 C) 98.5 F (36.9 C)    TempSrc: Oral Oral    Resp: 18 17    Weight:      SpO2: 97% 94% 81% 94%    Wt Readings from Last 3 Encounters:  05/27/15 56.2 kg (123 lb 14.4 oz)  04/30/15 56.586 kg (124 lb 12 oz)  03/29/15 58.06 kg (128 lb)     Intake/Output Summary (Last 24 hours) at 05/28/15 1050 Last data filed at 05/28/15 1030  Gross per 24 hour  Intake   1080 ml  Output    400 ml  Net    680 ml     Physical Exam  Awake Alert, Oriented X 3, No new F.N deficits, Normal affect [place].AT,PERRAL Supple Neck,No JVD, No cervical lymphadenopathy appriciated.  Symmetrical Chest wall movement, Good air movement bilaterally, +ve rales RRR,No Gallops,Rubs or new Murmurs, No Parasternal Heave +ve B.Sounds, Abd Soft, No tenderness, No organomegaly appriciated, No rebound - guarding or rigidity. No Cyanosis, Clubbing or edema, No new Rash or bruise       Data Review:    CBC  Recent Labs Lab 05/26/15 2013 05/27/15 0205  WBC 5.8 5.3  5.6  HGB 10.4* 10.8*  11.0*    HCT 30.3* 31.8*  32.1*  PLT 173 184  188  MCV 95.0 93.8  93.9  MCH 32.6 31.9  32.2  MCHC 34.3 34.0  34.3  RDW 14.1 13.9  14.0  LYMPHSABS 0.9  --   MONOABS 0.7  --   EOSABS 0.0  --   BASOSABS 0.0  --     Chemistries   Recent Labs Lab 05/26/15 2013 05/27/15 0205  NA 136 136  K 3.3* 3.5  CL 101 98*  CO2 26 28  GLUCOSE 113* 139*  BUN 12 10  CREATININE 0.91 0.94  1.02*  CALCIUM 8.6* 8.7*  AST 39  --   ALT 19  --   ALKPHOS 61  --   BILITOT 0.9  --    ------------------------------------------------------------------------------------------------------------------ No results for input(s): CHOL, HDL, LDLCALC, TRIG, CHOLHDL, LDLDIRECT in the last 72 hours.  Lab Results  Component Value Date   HGBA1C 6.0 01/01/2015   ------------------------------------------------------------------------------------------------------------------ No results for input(s): TSH, T4TOTAL, T3FREE, THYROIDAB in the last 72 hours.  Invalid input(s): FREET3 ------------------------------------------------------------------------------------------------------------------ No results for input(s): VITAMINB12, FOLATE, FERRITIN, TIBC, IRON, RETICCTPCT in the last 72 hours.  Coagulation profile No results for input(s): INR, PROTIME in the last 168 hours.   Recent Labs  05/27/15 0205  DDIMER 11.59*    Cardiac Enzymes No results for input(s): CKMB, TROPONINI, MYOGLOBIN in the last 168 hours.  Invalid input(s): CK ------------------------------------------------------------------------------------------------------------------    Component Value Date/Time   BNP 665.9* 05/26/2015 2014    Inpatient Medications  Scheduled Meds: . amLODipine  5 mg Oral Daily  .  aspirin EC  81 mg Oral Daily  . atorvastatin  20 mg Oral Daily  . bisacodyl  10 mg Oral Daily  . calcium-vitamin D  1 tablet Oral Daily  . clopidogrel  75 mg Oral Daily  . furosemide  20 mg Intravenous Once  . [START ON  05/29/2015] furosemide  40 mg Oral Daily  . insulin aspart  0-9 Units Subcutaneous TID WC  . isosorbide mononitrate  120 mg Oral Daily  . levothyroxine  50 mcg Oral QAC breakfast  . metoprolol succinate  100 mg Oral Daily  . potassium chloride SA  20 mEq Oral QPM  . potassium chloride  40 mEq Oral Once  . ranolazine  1,000 mg Oral BID  . sodium chloride flush  3 mL Intravenous Q12H   Continuous Infusions:  PRN Meds:.acetaminophen **OR** [DISCONTINUED] acetaminophen, cyclobenzaprine, meclizine, morphine injection, nitroGLYCERIN, [DISCONTINUED] ondansetron **OR** ondansetron (ZOFRAN) IV, oxyCODONE  Micro Results No results found for this or any previous visit (from the past 240 hour(s)).  Radiology Reports Ct Chest Wo Contrast  05/26/2015  CLINICAL DATA:  Fall.  Hypoxia. EXAM: CT CHEST WITHOUT CONTRAST TECHNIQUE: Multidetector CT imaging of the chest was performed following the standard protocol without IV contrast. COMPARISON:  03/28/2013 chest radiograph and 04/28/2012 chest CT. FINDINGS: Mediastinum/Nodes: Mild cardiomegaly. No pericardial fluid/thickening. Two lead right subclavian pacemaker is noted with lead tips in the right atrium and right ventricular apex. Left main and 3 vessel coronary atherosclerosis status post CABG with internal mammary and ascending aortic bypass grafts. Great vessels are normal in course and caliber. Atrophic appearing thyroid gland, unchanged. Normal esophagus. No pathologically enlarged axillary, mediastinal or gross hilar lymph nodes, noting limited sensitivity for the detection of hilar adenopathy on this noncontrast study. Lungs/Pleura: No pneumothorax. No pleural effusion. Right upper lobe 4 mm solid pulmonary nodule (series 4/ image 19) is stable since 04/28/2012 and benign. No acute consolidative airspace disease, new significant pulmonary nodules or lung masses. Subsegmental scarring versus atelectasis in the dependent lower lobes. Upper abdomen:  Unremarkable. Musculoskeletal: No aggressive appearing focal osseous lesions. Sternotomy wires appear aligned and intact. There is a fracture involving the superior endplate of the L1 vertebral body, with incomplete visualization of the L1 level on this study. Otherwise no fracture in the chest. Mild degenerative changes in the thoracic spine. IMPRESSION: 1. Fracture of the superior endplate of the L1 vertebral body, incompletely visualized on this chest CT study. Please see the separate concurrent lumbar spine CT report for further details. 2. Otherwise no acute traumatic injury in the chest. 3. Mild cardiomegaly.  No active pulmonary disease. Electronically Signed   By: Delbert Phenix M.D.   On: 05/26/2015 20:13   Ct Angio Chest Pe W/cm &/or Wo Cm  05/27/2015  CLINICAL DATA:  Elevated D-dimer EXAM: CT ANGIOGRAPHY CHEST WITH CONTRAST TECHNIQUE: Multidetector CT imaging of the chest was performed using the standard protocol during bolus administration of intravenous contrast. Multiplanar CT image reconstructions and MIPs were obtained to evaluate the vascular anatomy. CONTRAST:  80 mL Isovue 370 intravenous COMPARISON:  Radiographs 05/26/2015 FINDINGS: Cardiovascular: There is good opacification of the pulmonary arteries. There is no pulmonary embolism. The thoracic aorta is normal in caliber and intact. Lungs: Clear Central airways: Patent Effusions: None Lymphadenopathy: None Esophagus: Unremarkable Upper abdomen: Unremarkable Musculoskeletal: No significant abnormality. Review of the MIP images confirms the above findings. IMPRESSION: Negative for acute pulmonary embolism. No significant abnormality in the chest. Electronically Signed   By: Rosey Bath.D.  On: 05/27/2015 04:45   Ct Thoracic Spine Wo Contrast  05/26/2015  CLINICAL DATA:  Fall with hypoxia.  Initial encounter. EXAM: CT THORACIC SPINE WITHOUT CONTRAST TECHNIQUE: Multidetector CT imaging of the thoracic spine was performed without  intravenous contrast administration. Multiplanar CT image reconstructions were also generated. COMPARISON:  None. FINDINGS: T12 is incompletely encompassed on this study but is visualized on lumbar spine CT performed at the same time. No evidence of fracture or subluxation. No visualized canal hematoma or disc herniation. Degenerative changes are mild for age. No thoracic impingement suspected. No posttraumatic findings seen in the visualized intrathoracic cavity. Correlate with dedicated chest CT. IMPRESSION: No evidence of thoracic spine injury. Electronically Signed   By: Marnee Spring M.D.   On: 05/26/2015 20:08   Ct Lumbar Spine Wo Contrast  05/26/2015  CLINICAL DATA:  Fall with hypoxia. EXAM: CT LUMBAR SPINE WITHOUT CONTRAST TECHNIQUE: Multidetector CT imaging of the lumbar spine was performed without intravenous contrast administration. Multiplanar CT image reconstructions were also generated. COMPARISON:  04/1912 abdominal CT FINDINGS: L1 vertebral body fracture reaching both superior and inferior endplates but with predominantly horizontal upper body fracture plane. Height loss is less than 25% and there is no retropulsion or pedicular involvement. No other fracture is seen. No subluxation. Mild degenerative changes for age including lumbar facet spurring from L3-4 to the sacrum. Lower lumbar disc bulges. No posttraumatic finding in the visualized retroperitoneum. IMPRESSION: L1 compression fracture with mild height loss and no retropulsion. Electronically Signed   By: Marnee Spring M.D.   On: 05/26/2015 20:05   Dg Pelvis Portable  05/26/2015  CLINICAL DATA:  Acute onset of centralized lower back pain after fall. Initial encounter. EXAM: PORTABLE PELVIS 1-2 VIEWS COMPARISON:  CTA of the chest, abdomen and pelvis performed 04/28/2012 FINDINGS: There is no evidence of fracture or dislocation. Both femoral heads are seated normally within their respective acetabula. Mild degenerative change is noted  at the sacroiliac joints. The visualized bowel gas pattern is grossly unremarkable in appearance. Scattered vascular calcifications are seen. IMPRESSION: No evidence of fracture or dislocation. Electronically Signed   By: Roanna Raider M.D.   On: 05/26/2015 18:34   Dg Chest Portable 1 View  05/26/2015  CLINICAL DATA:  Patient with low back pain status post fall. EXAM: PORTABLE CHEST 1 VIEW COMPARISON:  Chest radiograph 03/28/2013. FINDINGS: Multi lead pacer apparatus overlies the right hemi thorax, leads are stable in position. Stable cardiac and mediastinal contours. Tortuosity and calcification of the thoracic aorta. Markedly low lung volumes. No large area pulmonary consolidation. No pleural effusion or pneumothorax. Osseous skeleton is unremarkable. IMPRESSION: No acute cardiopulmonary process.  Low lung volumes. Electronically Signed   By: Annia Belt M.D.   On: 05/26/2015 18:31    Time Spent in minutes  25   Sydney Moore K M.D on 05/28/2015 at 10:50 AM  Between 7am to 7pm - Pager - [phone]  After 7pm go to [email or web address] - password [email or web address]  [facility] -  Office  [phone]

## 2015-05-28 NOTE — Progress Notes (Signed)
Physical Therapy Treatment Patient Details Name: Sydney Moore MRN: [id number] DOB: 1923/12/18 Today's Date: 05/28/2015    History of Present Illness Patient is an 82 yo female admitted 05/26/15 following a fall with L1 compression fracture.  Patient with elevated d-dimer - CT negative and dopplers negative.   PMH:  CAD, CABG, CHF, pacemaker, HTN, DM    PT Comments    Pt progressing with mobility, ambulated 125' with RW and min-guard A. Continue to need O2 to maintain O2 sats at safe level. 78% on RA, 93% on 2L, 86% on 2L after ambulating 125'. PT will continue to follow.   Follow Up Recommendations  Home health PT;Supervision/Assistance - 24 hour     Equipment Recommendations  Rolling walker with 5" wheels;3in1 (PT) (RW already delivered)    Recommendations for Other Services       Precautions / Restrictions Precautions Precautions: Fall Required Braces or Orthoses: Spinal Brace Spinal Brace: Thoracolumbosacral orthotic;Applied in sitting position Restrictions Weight Bearing Restrictions: No    Mobility  Bed Mobility Overal bed mobility: Needs Assistance Bed Mobility: Rolling;Sidelying to Sit;Sit to Sidelying Rolling: Supervision Sidelying to sit: Supervision     Sit to sidelying: Supervision General bed mobility comments: vc's for technique to minimize pain, pt able to get up and down without physical assist. Min A to don back brace  Transfers Overall transfer level: Needs assistance Equipment used: Rolling walker (2 wheeled) Transfers: Sit to/from Stand Sit to Stand: Supervision         General transfer comment: vc's for hand placement. Pt was taught yesterday but did not remember this  Ambulation/Gait Ambulation/Gait assistance: Min guard Ambulation Distance (Feet): 125 Feet Assistive device: Rolling walker (2 wheeled) Gait Pattern/deviations: Step-through pattern;Decreased stride length Gait velocity: decreased Gait velocity interpretation: Below normal  speed for age/gender General Gait Details: pt with safe use of RW today. Guarded gait   Stairs            Wheelchair Mobility    Modified Rankin (Stroke Patients Only)       Balance Overall balance assessment: History of Falls;Needs assistance Sitting-balance support: Feet supported Sitting balance-Leahy Scale: Good     Standing balance support: During functional activity Standing balance-Leahy Scale: Fair Standing balance comment: unsteady without support                    Cognition Arousal/Alertness: Awake/alert Behavior During Therapy: WFL for tasks assessed/performed Overall Cognitive Status: Within Functional Limits for tasks assessed       Memory: Decreased short-term memory              Exercises      General Comments General comments (skin integrity, edema, etc.): O2 sats at rest on 2L O2 93%, on RA sitting EOB 78%. Ambulating on 2L O2 91% at 60', 86% at 125'.      Pertinent Vitals/Pain Pain Assessment: Faces Faces Pain Scale: Hurts little more Pain Location: back after ambulation Pain Descriptors / Indicators: Aching Pain Intervention(s): Monitored during session;Repositioned;Limited activity within patient's tolerance         See gait above  Home Living                      Prior Function            PT Goals (current goals can now be found in the care plan section) Acute Rehab PT Goals Patient Stated Goal: To decrease pain PT Goal Formulation: With patient Time For Goal  Achievement: 06/03/15 Potential to Achieve Goals: Good Progress towards PT goals: Progressing toward goals    Frequency  Min 5X/week    PT Plan Current plan remains appropriate    Co-evaluation             End of Session Equipment Utilized During Treatment: Gait belt;Back brace Activity Tolerance: Patient tolerated treatment well Patient left: in bed;with call bell/phone within reach;with bed alarm set     Time: 1416-1441 PT Time  Calculation (min) (ACUTE ONLY): 25 min  Charges:  $Gait Training: 23-37 mins                    G Codes:     Lyanne Co, PT  Acute Rehab Services  [phone]  Lyanne Co 05/28/2015, 3:37 PM

## 2015-05-28 NOTE — Plan of Care (Signed)
Problem: Health Behavior/Discharge Planning: Goal: Ability to manage health-related needs will improve Outcome: Completed/Met Date Met:  05/28/15 Home health set up by case manager.  Problem: Tissue Perfusion: Goal: Risk factors for ineffective tissue perfusion will decrease Outcome: Completed/Met Date Met:  05/28/15 SQ Lovenox ordered today.  Problem: Activity: Goal: Risk for activity intolerance will decrease Outcome: Progressing Able to ambulate to the bathroom with stand by assistance.  Problem: Bowel/Gastric: Goal: Will not experience complications related to bowel motility Outcome: Not Progressing Patient c/o constipation; PO dulcolax ordered today.

## 2015-05-28 NOTE — Progress Notes (Signed)
SATURATION QUALIFICATIONS: (This note is used to comply with regulatory documentation for home oxygen)  Patient Saturations on Room Air at Rest = 78%  Patient Saturations on Room Air while Ambulating =  Unable to ambulate safely on RA due to low O2 sats at rest on RA  Patient Saturations on 2 Liters of oxygen while Ambulating = 86%-91%%  Please briefly explain why patient needs home oxygen: Pt with desaturation of O2 when on RA at rest, unable to mobilize off O2 and maintain safe O2 sats.   Lyanne Co, PT  Acute Rehab Services  [phone]

## 2015-05-28 NOTE — Progress Notes (Signed)
Patient was on therapeutic Lovenox per MD dosing for r/o VTE. Dopplers negative for DVT, CT angio unremarkable.  Now to receive prophylactic Lovenox for VTE prevention.  SCr 0.94, CrCl ~26mL/min, only weighs 56.2kg and is 82 years old.  Plan: -Lovenox 30mg  subQ q24h for prophylaxis d/t borderline renal function, small size and advanced age -pharmacy to sign off- will follow peripherally if adjustment is needed  Cayce Quezada D. Aianna Fahs, PharmD, BCPS Clinical Pharmacist Pager: [phone] 05/28/2015 11:08 AM

## 2015-05-29 DIAGNOSIS — E119 Type 2 diabetes mellitus without complications: Secondary | ICD-10-CM | POA: Diagnosis not present

## 2015-05-29 DIAGNOSIS — I110 Hypertensive heart disease with heart failure: Secondary | ICD-10-CM | POA: Diagnosis not present

## 2015-05-29 DIAGNOSIS — E038 Other specified hypothyroidism: Secondary | ICD-10-CM | POA: Diagnosis not present

## 2015-05-29 DIAGNOSIS — S32010A Wedge compression fracture of first lumbar vertebra, initial encounter for closed fracture: Secondary | ICD-10-CM | POA: Diagnosis not present

## 2015-05-29 DIAGNOSIS — T148 Other injury of unspecified body region: Secondary | ICD-10-CM | POA: Diagnosis not present

## 2015-05-29 LAB — GLUCOSE, CAPILLARY
GLUCOSE-CAPILLARY: 93 mg/dL (ref 65–99)
GLUCOSE-CAPILLARY: 114 mg/dL — AB (ref 65–99)

## 2015-05-29 MED ORDER — OXYCODONE HCL 5 MG PO TABS: 5.0000 mg | ORAL_TABLET | Freq: Four times a day (QID) | ORAL | Status: DC | PRN

## 2015-05-29 MED ORDER — BISACODYL 10 MG RE SUPP
10.0000 mg | Freq: Every day | RECTAL | Status: DC
Start: 2015-05-29 — End: 2015-05-29
  Filled 2015-05-29: qty 1

## 2015-05-29 MED ORDER — POLYETHYLENE GLYCOL 3350 17 G PO PACK
17.0000 g | PACK | Freq: Two times a day (BID) | ORAL | Status: DC
  Administered 2015-05-29: 17 g via ORAL
  Filled 2015-05-29: qty 1

## 2015-05-29 MED ORDER — FUROSEMIDE 10 MG/ML IJ SOLN
40.0000 mg | Freq: Once | INTRAMUSCULAR | Status: AC
  Administered 2015-05-29: 40 mg via INTRAVENOUS
  Filled 2015-05-29: qty 4

## 2015-05-29 MED ORDER — ONDANSETRON HCL 4 MG PO TABS: 4.0000 mg | ORAL_TABLET | Freq: Three times a day (TID) | ORAL | Status: AC | PRN

## 2015-05-29 MED ORDER — POTASSIUM CHLORIDE CRYS ER 20 MEQ PO TBCR: 20.0000 meq | EXTENDED_RELEASE_TABLET | Freq: Two times a day (BID) | ORAL | Status: DC

## 2015-05-29 MED ORDER — FUROSEMIDE 40 MG PO TABS: 40.0000 mg | ORAL_TABLET | Freq: Every day | ORAL | Status: DC

## 2015-05-29 MED ORDER — FUROSEMIDE 40 MG PO TABS: 40.0000 mg | ORAL_TABLET | Freq: Two times a day (BID) | ORAL | Status: DC

## 2015-05-29 NOTE — Progress Notes (Signed)
This is a late entry.  Patient was on 2L O2 n/c. This was taken off at 0945 this morning and patient placed on RA.Marland Kitchen At 1030, patient was 85% on RA. Patient placed back on 2L and O2 sats increased to 94%. Dr. Thedore Mins notified. Orders received to arrange for home oxygen. Unit case manager notified.  Leanna Battles, RN.

## 2015-05-29 NOTE — Discharge Instructions (Signed)
Follow with Primary MD Myrlene Broker, MD in 3 days   Get CBC, CMP, 2 view Chest X ray checked  by Primary MD next visit.    Activity:  Wear the lumbar brace when out of the bed, activity As tolerated with Full fall precautions use walker/cane & assistance as needed   Disposition Home     Diet:   Heart Healthy low Carb.  Check your Weight same time everyday, if you gain over 2 pounds, or you develop in leg swelling, experience more shortness of breath or chest pain, call your Primary MD immediately. Follow Cardiac Low Salt Diet and 1.2 lit/day fluid restriction.   On your next visit with your primary care physician please Get Medicines reviewed and adjusted.   Please request your Prim.MD to go over all Hospital Tests and Procedure/Radiological results at the follow up, please get all Hospital records sent to your Prim MD by signing hospital release before you go home.   If you experience worsening of your admission symptoms, develop shortness of breath, life threatening emergency, suicidal or homicidal thoughts you must seek medical attention immediately by calling 911 or calling your MD immediately  if symptoms less severe.  You Must read complete instructions/literature along with all the possible adverse reactions/side effects for all the Medicines you take and that have been prescribed to you. Take any new Medicines after you have completely understood and accpet all the possible adverse reactions/side effects.   Do not drive, operating heavy machinery, perform activities at heights, swimming or participation in water activities or provide baby sitting services if your were admitted for syncope or siezures until you have seen by Primary MD or a Neurologist and advised to do so again.  Do not drive when taking Pain medications.    Do not take more than prescribed Pain, Sleep and Anxiety Medications  Special Instructions: If you have smoked or chewed Tobacco  in the last 2 yrs  please stop smoking, stop any regular Alcohol  and or any Recreational drug use.  Wear Seat belts while driving.   Please note  You were cared for by a hospitalist during your hospital stay. If you have any questions about your discharge medications or the care you received while you were in the hospital after you are discharged, you can call the unit and asked to speak with the hospitalist on call if the hospitalist that took care of you is not available. Once you are discharged, your primary care physician will handle any further medical issues. Please note that NO REFILLS for any discharge medications will be authorized once you are discharged, as it is imperative that you return to your primary care physician (or establish a relationship with a primary care physician if you do not have one) for your aftercare needs so that they can reassess your need for medications and monitor your lab values.

## 2015-05-29 NOTE — Discharge Summary (Addendum)
Sydney Moore, is a 82 y.o. female  DOB February 08, 1924  MRN [id number].  Admission date:  05/26/2015  Admitting Physician  Michael Litter, MD  Discharge Date:  05/29/2015   Primary MD  Myrlene Broker, MD  Recommendations for primary care physician for things to follow:   Check weight, BMP, adjust diuretic dose if needed in 3-5 days  Outpatient follow-up with her cardiologist and Dr. Julio Sicks neurosurgeon within a week   Admission Diagnosis  Fall   Discharge Diagnosis  Fall     Active Problems:   Hypothyroidism   Diet-controlled diabetes mellitus (HCC)   Hypertensive cardiovascular disease   Hypoxic   Compression fracture      Past Medical History  Diagnosis Date  . Hypertension   . Hyperlipidemia   . Diabetes mellitus   . Chronic cough     Seen by Dr. Sherene Sires  . Hypothyroid   . CAD (coronary artery disease) 1989    a. 1989: s/p CABGx3;  b. 06/2007 Cath/Attempted PCI: LM min irregs, LAD 90/100, D1 90 (attempted PCI), RI 90, LCX100p, OM1 100, RCA 60/70p, 90/48m, VG->RCA nl, VG->OM 100, LIMA->LAD nl, EF 50%.  . Complete heart block (HCC)     a. s/p PPM (MDT) by Dr Deborah Chalk 2004; b. Gen change 09/2010 (MDT ADDRL1 Adapta DC PPM Ser #:[id number]  . Carotid artery occlusion 2011    60-79%right/ 40-59% left  . GERD (gastroesophageal reflux disease)   . Chronic systolic CHF (congestive heart failure) (HCC)     a. 03/2011 Echo: EF 40% inflat HK, Gr1 DD, mod MR/TR  . Chest pain March 2014    Past Surgical History  Procedure Laterality Date  . Coronary artery bypass graft  1989  . Pacemaker placement  2004    MDT by Dr Deborah Chalk  . Cataract extraction  2004    Both  . Cardiac catheterization  2004, 2007, 2009  . Cardiac catheterization N/A 01/18/2015    Procedure: Left Heart Cath and Cors/Grafts Angiography;   Surgeon: Kathleene Hazel, MD;  Location: [facility] INVASIVE CV LAB;  Service: Cardiovascular;  Laterality: N/A;       HPI  from the history and physical done on the day of admission:    Sydney Moore is a 82 y.o. woman with an extensive cardiac history including compensated systolic heart failure, CAD S/P CABG, S/P PPM implant for complete heart block, HTN, HLD, and diet controlled diabetes who feels that she was in her baseline state of health until she had an accidental fall today. She was talking to someone, became distracted, and missed a step. She developed acute, intractable low back pain. Her daughter called 911 for transport to the ED. Imaging shows a mild compression fracture to L1. The patient is having intractable pain, refractory to IV pain medication given in the ED. She is also having intermittent episodes of hypoxia, requiring supplemental oxygen, which is new. No chest pain or shortness of breath. No dizziness or syncope. No headache, no nausea, no vomiting, no  abdominal pain.   Hospital Course:     1.Mechanical fall with L1 compression fracture without retropulsion. Discussed her case with neurosurgeon on call Dr. Julio Sicks, for now TLSO brace, supportive care, follow in 2-3 weeks with Dr. Dutch Quint, PT eval. She is much better and will be discharged with home PT.  2. Severely elevated d-dimer. CT angiogram chest unremarkable, lower extremity venous duplex will be obtained. She already received therapeutic Lovenox 1 dose.  3.Hypothyroidism. On Synthroid continue.  4. Dyslipidemia. On statin.  5. CAD. Chest pain-free. Continue combination of Ranexa, Imdur, aspirin, Plavix, statin and beta blocker for secondary prevention.  6. Hypoxia upon admission. Due to #7 below.  7. Mild Acute on Chronic combined systolic and diastolic heart failure. Echogram 40%. Her BNP was elevated upon admission, she further gives history that she was getting more short of breath with exertion  for the last 1-2 weeks, on exam although she did not have any edema she did have rales, she has significantly improved on IV Lasix, her home dose Lasix will be increased from 40 once a day to 40 twice a day, she does not restrict her fluid or salt intake and she has been counseled on doing that and has been provided with written instructions. Today she is symptom-free, upon ammunition she does drop her oxygen saturations to 88% on room air therefore she will get home oxygen. She might needed for the short-term, will request PCP to reevaluate in 5-7 days her weight, BMP, diuretic dose and oxygen need.  8. Diet-controlled Pre DM. Continue diet control and follow with PCP.     Follow UP  Follow-up Information    Follow up with [facility].   Why:  rolling walker and 3n1 (bedside commode)   Contact information:   [address] [place] [address] [phone]       Follow up with [facility].   Why:  home health physical therapy, aide, and social worker   Contact information:   [address] [place] [address] [phone]       Follow up with Myrlene Broker, MD. Schedule an appointment as soon as possible for a visit in 3 days.   Specialty:  Internal Medicine   Why:  And your cardiologist in 3-5 days   Contact information:   [address] [place] [address] [phone]       Follow up with POOL,HENRY A, MD. Schedule an appointment as soon as possible for a visit in 1 week.   Specialty:  Neurosurgery   Contact information:   [address] [place] [address] [phone]        Consults obtained - N surgery Dr Jordan Likes over the phone  Discharge Condition: Fair  Diet and Activity recommendation: See Discharge Instructions below  Discharge Instructions           Discharge Instructions    Discharge instructions    Complete by:  As directed   Follow with Primary MD Myrlene Broker, MD in 3 days   Get CBC, CMP, 2 view Chest X ray checked  by Primary MD next visit.    Activity:  Wear the lumbar brace when out of the bed, activity As tolerated with Moore fall precautions use walker/cane & assistance as needed   Disposition Home     Diet:   Heart Healthy low Carb.  Check your Weight same time everyday, if you gain  over 2 pounds, or you develop in leg swelling, experience more shortness of breath or chest pain, call your Primary MD immediately. Follow Cardiac Low Salt Diet and 1.2 lit/day fluid restriction.   On your next visit with your primary care physician please Get Medicines reviewed and adjusted.   Please request your Prim.MD to go over all Hospital Tests and Procedure/Radiological results at the follow up, please get all Hospital records sent to your Prim MD by signing hospital release before you go home.   If you experience worsening of your admission symptoms, develop shortness of breath, life threatening emergency, suicidal or homicidal thoughts you must seek medical attention immediately by calling 911 or calling your MD immediately  if symptoms less severe.  You Must read complete instructions/literature along with all the possible adverse reactions/side effects for all the Medicines you take and that have been prescribed to you. Take any new Medicines after you have completely understood and accpet all the possible adverse reactions/side effects.   Do not drive, operating heavy machinery, perform activities at heights, swimming or participation in water activities or provide baby sitting services if your were admitted for syncope or siezures until you have seen by Primary MD or a Neurologist and advised to do so again.  Do not drive when taking Pain medications.    Do not take more than prescribed Pain, Sleep and Anxiety Medications  Special Instructions: If you have smoked or chewed Tobacco  in the last 2 yrs please stop smoking, stop any  regular Alcohol  and or any Recreational drug use.  Wear Seat belts while driving.   Please note  You were cared for by a hospitalist during your hospital stay. If you have any questions about your discharge medications or the care you received while you were in the hospital after you are discharged, you can call the unit and asked to speak with the hospitalist on call if the hospitalist that took care of you is not available. Once you are discharged, your primary care physician will handle any further medical issues. Please note that NO REFILLS for any discharge medications will be authorized once you are discharged, as it is imperative that you return to your primary care physician (or establish a relationship with a primary care physician if you do not have one) for your aftercare needs so that they can reassess your need for medications and monitor your lab values.     Increase activity slowly    Complete by:  As directed              Discharge Medications       Medication List    TAKE these medications        amLODipine 5 MG tablet  Commonly known as:  NORVASC  Take 1 tablet (5 mg total) by mouth daily.     aspirin 81 MG tablet  Take 1 tablet (81 mg total) by mouth every morning.     atorvastatin 20 MG tablet  Commonly known as:  LIPITOR  Take 1 tablet (20 mg total) by mouth daily.     bisacodyl 5 MG EC tablet  Commonly known as:  DULCOLAX  Take 1 tablet (5 mg total) by mouth daily as needed for moderate constipation.     calcium-vitamin D 500-200 MG-UNIT tablet  Commonly known as:  OSCAL WITH D  Take 1 tablet by mouth daily.     clopidogrel 75 MG tablet  Commonly known as:  PLAVIX  Take  1 tablet (75 mg total) by mouth daily.     furosemide 40 MG tablet  Commonly known as:  LASIX  Take 1 tablet (40 mg total) by mouth 2 (two) times daily. Take 2 pills in the morning and 1 pill at night     isosorbide mononitrate 120 MG 24 hr tablet  Commonly known as:  IMDUR    Take 1 tablet (120 mg total) by mouth daily.     levothyroxine 50 MCG tablet  Commonly known as:  SYNTHROID, LEVOTHROID  Take 1 tablet (50 mcg total) by mouth daily.     losartan 50 MG tablet  Commonly known as:  COZAAR  Take 0.5-1 tablets (25-50 mg total) by mouth daily as needed. Takes when SBP>130     meclizine 12.5 MG tablet  Commonly known as:  ANTIVERT  Take 1 tablet (12.5 mg total) by mouth as needed for dizziness (up to three times a day).     metoprolol succinate 100 MG 24 hr tablet  Commonly known as:  TOPROL-XL  Take 1 tablet (100 mg total) by mouth daily. Take with or immediately following a meal.     nitroGLYCERIN 0.4 MG/SPRAY spray  Commonly known as:  NITROLINGUAL  Place 1 spray under the tongue every 5 (five) minutes x 3 doses as needed for chest pain.     ondansetron 4 MG tablet  Commonly known as:  ZOFRAN  Take 1 tablet (4 mg total) by mouth every 8 (eight) hours as needed for nausea or vomiting.     oxyCODONE 5 MG immediate release tablet  Commonly known as:  Oxy IR/ROXICODONE  Take 1 tablet (5 mg total) by mouth every 6 (six) hours as needed for moderate pain.     potassium chloride SA 20 MEQ tablet  Commonly known as:  K-DUR,KLOR-CON  Take 1 tablet (20 mEq total) by mouth 2 (two) times daily.     promethazine-codeine 6.25-10 MG/5ML syrup  Commonly known as:  PHENERGAN with CODEINE  Take 5 mLs by mouth every 4 (four) hours as needed for cough.     ranolazine 1000 MG SR tablet  Commonly known as:  RANEXA  Take 1 tablet (1,000 mg total) by mouth 2 (two) times daily.        Major procedures and Radiology Reports - PLEASE review detailed and final reports for all details, in brief -       Ct Chest Wo Contrast  05/26/2015  CLINICAL DATA:  Fall.  Hypoxia. EXAM: CT CHEST WITHOUT CONTRAST TECHNIQUE: Multidetector CT imaging of the chest was performed following the standard protocol without IV contrast. COMPARISON:  03/28/2013 chest radiograph and  04/28/2012 chest CT. FINDINGS: Mediastinum/Nodes: Mild cardiomegaly. No pericardial fluid/thickening. Two lead right subclavian pacemaker is noted with lead tips in the right atrium and right ventricular apex. Left main and 3 vessel coronary atherosclerosis status post CABG with internal mammary and ascending aortic bypass grafts. Great vessels are normal in course and caliber. Atrophic appearing thyroid gland, unchanged. Normal esophagus. No pathologically enlarged axillary, mediastinal or gross hilar lymph nodes, noting limited sensitivity for the detection of hilar adenopathy on this noncontrast study. Lungs/Pleura: No pneumothorax. No pleural effusion. Right upper lobe 4 mm solid pulmonary nodule (series 4/ image 19) is stable since 04/28/2012 and benign. No acute consolidative airspace disease, new significant pulmonary nodules or lung masses. Subsegmental scarring versus atelectasis in the dependent lower lobes. Upper abdomen: Unremarkable. Musculoskeletal: No aggressive appearing focal osseous lesions. Sternotomy wires appear aligned and  intact. There is a fracture involving the superior endplate of the L1 vertebral body, with incomplete visualization of the L1 level on this study. Otherwise no fracture in the chest. Mild degenerative changes in the thoracic spine. IMPRESSION: 1. Fracture of the superior endplate of the L1 vertebral body, incompletely visualized on this chest CT study. Please see the separate concurrent lumbar spine CT report for further details. 2. Otherwise no acute traumatic injury in the chest. 3. Mild cardiomegaly.  No active pulmonary disease. Electronically Signed   By: Delbert Phenix M.D.   On: 05/26/2015 20:13   Ct Angio Chest Pe W/cm &/or Wo Cm  05/27/2015  CLINICAL DATA:  Elevated D-dimer EXAM: CT ANGIOGRAPHY CHEST WITH CONTRAST TECHNIQUE: Multidetector CT imaging of the chest was performed using the standard protocol during bolus administration of intravenous contrast.  Multiplanar CT image reconstructions and MIPs were obtained to evaluate the vascular anatomy. CONTRAST:  80 mL Isovue 370 intravenous COMPARISON:  Radiographs 05/26/2015 FINDINGS: Cardiovascular: There is good opacification of the pulmonary arteries. There is no pulmonary embolism. The thoracic aorta is normal in caliber and intact. Lungs: Clear Central airways: Patent Effusions: None Lymphadenopathy: None Esophagus: Unremarkable Upper abdomen: Unremarkable Musculoskeletal: No significant abnormality. Review of the MIP images confirms the above findings. IMPRESSION: Negative for acute pulmonary embolism. No significant abnormality in the chest. Electronically Signed   By: Ellery Plunk M.D.   On: 05/27/2015 04:45   Ct Thoracic Spine Wo Contrast  05/26/2015  CLINICAL DATA:  Fall with hypoxia.  Initial encounter. EXAM: CT THORACIC SPINE WITHOUT CONTRAST TECHNIQUE: Multidetector CT imaging of the thoracic spine was performed without intravenous contrast administration. Multiplanar CT image reconstructions were also generated. COMPARISON:  None. FINDINGS: T12 is incompletely encompassed on this study but is visualized on lumbar spine CT performed at the same time. No evidence of fracture or subluxation. No visualized canal hematoma or disc herniation. Degenerative changes are mild for age. No thoracic impingement suspected. No posttraumatic findings seen in the visualized intrathoracic cavity. Correlate with dedicated chest CT. IMPRESSION: No evidence of thoracic spine injury. Electronically Signed   By: Marnee Spring M.D.   On: 05/26/2015 20:08   Ct Lumbar Spine Wo Contrast  05/26/2015  CLINICAL DATA:  Fall with hypoxia. EXAM: CT LUMBAR SPINE WITHOUT CONTRAST TECHNIQUE: Multidetector CT imaging of the lumbar spine was performed without intravenous contrast administration. Multiplanar CT image reconstructions were also generated. COMPARISON:  04/1912 abdominal CT FINDINGS: L1 vertebral body fracture reaching  both superior and inferior endplates but with predominantly horizontal upper body fracture plane. Height loss is less than 25% and there is no retropulsion or pedicular involvement. No other fracture is seen. No subluxation. Mild degenerative changes for age including lumbar facet spurring from L3-4 to the sacrum. Lower lumbar disc bulges. No posttraumatic finding in the visualized retroperitoneum. IMPRESSION: L1 compression fracture with mild height loss and no retropulsion. Electronically Signed   By: Marnee Spring M.D.   On: 05/26/2015 20:05   Dg Pelvis Portable  05/26/2015  CLINICAL DATA:  Acute onset of centralized lower back pain after fall. Initial encounter. EXAM: PORTABLE PELVIS 1-2 VIEWS COMPARISON:  CTA of the chest, abdomen and pelvis performed 04/28/2012 FINDINGS: There is no evidence of fracture or dislocation. Both femoral heads are seated normally within their respective acetabula. Mild degenerative change is noted at the sacroiliac joints. The visualized bowel gas pattern is grossly unremarkable in appearance. Scattered vascular calcifications are seen. IMPRESSION: No evidence of fracture or dislocation. Electronically Signed  By: Roanna Raider M.D.   On: 05/26/2015 18:34   Dg Chest Portable 1 View  05/26/2015  CLINICAL DATA:  Patient with low back pain status post fall. EXAM: PORTABLE CHEST 1 VIEW COMPARISON:  Chest radiograph 03/28/2013. FINDINGS: Multi lead pacer apparatus overlies the right hemi thorax, leads are stable in position. Stable cardiac and mediastinal contours. Tortuosity and calcification of the thoracic aorta. Markedly low lung volumes. No large area pulmonary consolidation. No pleural effusion or pneumothorax. Osseous skeleton is unremarkable. IMPRESSION: No acute cardiopulmonary process.  Low lung volumes. Electronically Signed   By: Annia Belt M.D.   On: 05/26/2015 18:31    Micro Results      No results found for this or any previous visit (from the past 240  hour(s)).     Today   Subjective    Kenadi Miltner today has no headache,no chest abdominal pain,no new weakness tingling or numbness, feels much better wants to go home today.     Objective   Blood pressure 135/46, pulse 63, temperature 98.6 F (37 C), temperature source Oral, resp. rate 18, height 5\' 3"  (1.6 m), weight 56.2 kg (123 lb 14.4 oz), SpO2 94 %.   Intake/Output Summary (Last 24 hours) at 05/29/15 1511 Last data filed at 05/29/15 1400  Gross per 24 hour  Intake    583 ml  Output    450 ml  Net    133 ml    Exam Awake Alert, Oriented x 3, No new F.N deficits, Normal affect S[place]th.AT,PERRAL Supple Neck,No JVD, No cervical lymphadenopathy appriciated.  Symmetrical Chest wall movement, Good air movement bilaterally, few basilar rales RRR,No Gallops,Rubs or new Murmurs, No Parasternal Heave +ve B.Sounds, Abd Soft, Non tender, No organomegaly appriciated, No rebound -guarding or rigidity. No Cyanosis, Clubbing or edema, No new Rash or bruise   Data Review   CBC w Diff:  Lab Results  Component Value Date   WBC 5.6 05/27/2015   WBC 5.3 05/27/2015   HGB 11.0* 05/27/2015   HGB 10.8* 05/27/2015   HCT 32.1* 05/27/2015   HCT 31.8* 05/27/2015   PLT 188 05/27/2015   PLT 184 05/27/2015   LYMPHOPCT 15 05/26/2015   MONOPCT 12 05/26/2015   EOSPCT 0 05/26/2015   BASOPCT 0 05/26/2015    CMP:  Lab Results  Component Value Date   NA 136 05/27/2015   K 3.5 05/27/2015   CL 98* 05/27/2015   CO2 28 05/27/2015   BUN 10 05/27/2015   CREATININE 1.02* 05/27/2015   CREATININE 0.94 05/27/2015   CREATININE 1.01* 01/15/2015   PROT 6.9 05/26/2015   ALBUMIN 3.6 05/26/2015   BILITOT 0.9 05/26/2015   ALKPHOS 61 05/26/2015   AST 39 05/26/2015   ALT 19 05/26/2015  .   Total Time in preparing paper work, data evaluation and todays exam - 35 minutes  Leroy Sea M.D on 05/29/2015 at 3:11 PM  [facility]   Office  [phone]

## 2015-05-29 NOTE — Care Management Obs Status (Signed)
MEDICARE OBSERVATION STATUS NOTIFICATION   Patient Details  Name: Sydney Moore MRN: [id number] Date of Birth: [birth year]   Medicare Observation Status Notification Given:  Yes    Azaylia Fong, Annamarie Major, RN 05/29/2015, 1:50 PM

## 2015-05-29 NOTE — Progress Notes (Signed)
Patient to be discharged to home. PIV removed per protocol. Telemetry box #21 removed and returned to nurse's station. Discharge instructions, follow up appts, and medications to be reviewed with patient by unit CN. Patient d/c'd with home oxygen supplied by [facility].   Leanna Battles, RN.

## 2015-05-30 ENCOUNTER — Telehealth: Payer: Self-pay | Admitting: *Deleted

## 2015-05-30 NOTE — Telephone Encounter (Signed)
Transition Care Management Follow-up Telephone Call  Date discharged? 05/29/15   How have you been since you were released from the hospital? Pt states she is feeling alright   Do you understand why you were in the hospital? YES   Do you understand the discharge instructions? YES   Where were you discharged to? Home   Items Reviewed:  Medications reviewed: YES  Allergies reviewed: YES  Dietary changes reviewed: YES  Referrals reviewed: YES, advance has contact her suppose to be coming out today.   Functional Questionnaire:   Activities of Daily Living (ADLs):   She states she are independent in the following: ambulation, bathing and hygiene, feeding, continence, grooming, toileting and dressing States she require assistance with the following: ambulation   Any transportation issues/concerns?: NO   Any patient concerns? NO   Confirmed importance and date/time of follow-up visits scheduled YES, appt 05/31/15  Provider Appointment booked with Rennie Plowman, NP  Confirmed with patient if condition begins to worsen call PCP or go to the ER.  Patient was given the office number and encouraged to call back with question or concerns.  : YES

## 2015-05-31 ENCOUNTER — Ambulatory Visit: Payer: Medicare Other | Admitting: Family

## 2015-06-01 ENCOUNTER — Encounter: Payer: Self-pay | Admitting: Cardiovascular Disease

## 2015-06-01 ENCOUNTER — Ambulatory Visit (INDEPENDENT_AMBULATORY_CARE_PROVIDER_SITE_OTHER): Payer: Medicare Other | Admitting: Cardiovascular Disease

## 2015-06-01 ENCOUNTER — Ambulatory Visit (INDEPENDENT_AMBULATORY_CARE_PROVIDER_SITE_OTHER): Payer: Medicare Other | Admitting: Family

## 2015-06-01 ENCOUNTER — Encounter: Payer: Self-pay | Admitting: Family

## 2015-06-01 VITALS — BP 128/54 | HR 68 | Ht 63.0 in | Wt 123.0 lb

## 2015-06-01 VITALS — BP 130/58 | HR 65 | Resp 14 | Ht 63.0 in | Wt 125.0 lb

## 2015-06-01 DIAGNOSIS — I779 Disorder of arteries and arterioles, unspecified: Secondary | ICD-10-CM

## 2015-06-01 DIAGNOSIS — I25110 Atherosclerotic heart disease of native coronary artery with unstable angina pectoris: Secondary | ICD-10-CM

## 2015-06-01 DIAGNOSIS — IMO0002 Reserved for concepts with insufficient information to code with codable children: Secondary | ICD-10-CM

## 2015-06-01 DIAGNOSIS — I25700 Atherosclerosis of coronary artery bypass graft(s), unspecified, with unstable angina pectoris: Secondary | ICD-10-CM

## 2015-06-01 DIAGNOSIS — I25708 Atherosclerosis of coronary artery bypass graft(s), unspecified, with other forms of angina pectoris: Secondary | ICD-10-CM

## 2015-06-01 DIAGNOSIS — T148 Other injury of unspecified body region: Secondary | ICD-10-CM | POA: Diagnosis not present

## 2015-06-01 DIAGNOSIS — I2510 Atherosclerotic heart disease of native coronary artery without angina pectoris: Secondary | ICD-10-CM | POA: Insufficient documentation

## 2015-06-01 DIAGNOSIS — I5042 Chronic combined systolic (congestive) and diastolic (congestive) heart failure: Secondary | ICD-10-CM

## 2015-06-01 DIAGNOSIS — I739 Peripheral vascular disease, unspecified: Secondary | ICD-10-CM

## 2015-06-01 DIAGNOSIS — I119 Hypertensive heart disease without heart failure: Secondary | ICD-10-CM

## 2015-06-01 LAB — BASIC METABOLIC PANEL
BUN: 16 mg/dL (ref 7–25)
CALCIUM: 9.1 mg/dL (ref 8.6–10.4)
CHLORIDE: 88 mmol/L — AB (ref 98–110)
CO2: 32 mmol/L — AB (ref 20–31)
CREATININE: 1.04 mg/dL — AB (ref 0.60–0.88)
GLUCOSE: 111 mg/dL — AB (ref 65–99)
Potassium: 3.6 mmol/L (ref 3.5–5.3)
Sodium: 129 mmol/L — ABNORMAL LOW (ref 135–146)

## 2015-06-01 MED ORDER — CLOPIDOGREL BISULFATE 75 MG PO TABS: 75.0000 mg | ORAL_TABLET | Freq: Every day | ORAL | Status: DC

## 2015-06-01 MED ORDER — LOSARTAN POTASSIUM 50 MG PO TABS: 25.0000 mg | ORAL_TABLET | Freq: Every day | ORAL | Status: DC | PRN

## 2015-06-01 MED ORDER — POTASSIUM CHLORIDE CRYS ER 20 MEQ PO TBCR: 20.0000 meq | EXTENDED_RELEASE_TABLET | Freq: Two times a day (BID) | ORAL | Status: DC

## 2015-06-01 MED ORDER — METOPROLOL SUCCINATE ER 100 MG PO TB24: 100.0000 mg | ORAL_TABLET | Freq: Every day | ORAL | Status: DC

## 2015-06-01 MED ORDER — CYCLOBENZAPRINE HCL 5 MG PO TABS: 5.0000 mg | ORAL_TABLET | Freq: Three times a day (TID) | ORAL | Status: DC | PRN

## 2015-06-01 MED ORDER — NITROGLYCERIN 0.4 MG/SPRAY TL SOLN: 1.0000 | Status: DC | PRN

## 2015-06-01 MED ORDER — ISOSORBIDE MONONITRATE ER 120 MG PO TB24: 120.0000 mg | ORAL_TABLET | Freq: Every day | ORAL | Status: DC

## 2015-06-01 MED ORDER — AMLODIPINE BESYLATE 5 MG PO TABS: 5.0000 mg | ORAL_TABLET | Freq: Every day | ORAL | Status: DC

## 2015-06-01 MED ORDER — RANOLAZINE ER 1000 MG PO TB12: 1000.0000 mg | ORAL_TABLET | Freq: Two times a day (BID) | ORAL | Status: DC

## 2015-06-01 MED ORDER — ATORVASTATIN CALCIUM 20 MG PO TABS: 20.0000 mg | ORAL_TABLET | Freq: Every day | ORAL | Status: DC

## 2015-06-01 MED ORDER — FUROSEMIDE 40 MG PO TABS: 40.0000 mg | ORAL_TABLET | Freq: Two times a day (BID) | ORAL | Status: DC

## 2015-06-01 NOTE — Progress Notes (Signed)
Pre visit review using our clinic review tool, if applicable. No additional management support is needed unless otherwise documented below in the visit note. 

## 2015-06-01 NOTE — Patient Instructions (Signed)
Medication Instructions:  Same-no changes  Labwork: BMET today  Testing/Procedures: None  Follow-Up: Your physician recommends that you schedule a follow-up appointment in: 3 months      If you need a refill on your cardiac medications before your next appointment, please call your pharmacy.   

## 2015-06-01 NOTE — Progress Notes (Signed)
Sydney Moore Date of Birth  [birth year]   Problems: 1. CAD - s/p CABG 1989  2. Pacer - for advanced AV block 3. PVD- left carotid bruit 4. CHF - EF of 40% 5. Diabetes Mellitus 6. Hypothyroidism 7. Bilateral carotid bruits:    History of Present Illness:  82 year old female with a history of coronary artery disease and pacemaker implant.  She has a history of peripheral vascular disease and has a known left carotid bruit which we are following.   She's had a history of coronary artery bypass grafting. She denies any shortness of breath or chest pain. She does complain of some leg weakness particularly with walking.  Check pacer generator changed in October. She has developed some keloid formation over the pacer incision  A recent echocardiogram reveals mild to moderate left ventricular dysfunction with an ejection fraction of 40%.  There is moderate mitral regurgitation and moderate tricuspid regurgitation. Right ventricle appears to be dilated. Her pulmonary pressures are at the upper limits of normal at 36 mmHg. Her Lasix dose was increased by Dr. Ladona Ridgel and she's feeling a bit better.  Septeber 17, 2013 - she has done well.  She has occasional episodes of CP when walking - goes away very quickly with a SL NTG.  These episodes of chest pain are very rare. She's able to do most of her normal activities without any significant problems. She has known stenosis in her ramus intermediate branch that was not amenable to PCI. We were not able to put a wire down that vessel. At that point she was started on Ranexa and has done well since that time.  June 27 ,2014: [name] is doing well.  She has occasional CP and takes NTG occasionally.  Feb. 9, 2015: [name] is doing ok.  She still has some exertional angina relieved with SL NTG.   She is able to do all of her normal daily activities without significant limitations as long as she takes the nitroglycerin.   She is on Ranexa 1000 BID.     Her BP is high this am - she has not taken her BP pills yet.  April 13, 2013:  Sydney Moore has been admitted to the hospital since I last saw her. She had a stress Myoview study which revealed a fixed apical defect and no ischemia.  Ejection fraction remained moderately reduced at 35%.  The isosorbide was increased to 120 mg a day.  She continues to have some mild chest pain but thinks that the episodes are decreased from before the hospitalization. She does use sublingual nitroglycerin on a daily basis that several times a day infact.   August 09, 2013:  She is doing well.  No significant angina.     Dec. 1, 2015:  Sydney Moore is a 82 y.o. female who I see for CAD, pacer, HTN, and mild chronic systolic CHF. Has some generalized fatigue. Has rare episodes of angina and use NTG spray.   Symptoms seem to be stable . She has requested a handicap sticker.   Aug. 4, 2016:  Still having some angina. Resolves with 1 SL NTG.  No syncope   Dec. 5, 2016  Having more angina .  Takes more NTG.  Relieves the pain .   Feb. 16, 2017:  Doing ok Still having to take NTG regularly  - several times a day .   Is on max dose Imdur  Her angina pattern is stable   June 01, 2015:  [name] is seen for follow up Larey Seat down the steps  This past weekend Has a vertebral fracture ( compression Fx of L1)  Is now on Home O2.    Current Outpatient Prescriptions on File Prior to Visit  Medication Sig Dispense Refill  . amLODipine (NORVASC) 5 MG tablet Take 1 tablet (5 mg total) by mouth daily. 90 tablet 3  . aspirin 81 MG tablet Take 1 tablet (81 mg total) by mouth every morning. 90 tablet 1  . atorvastatin (LIPITOR) 20 MG tablet Take 1 tablet (20 mg total) by mouth daily. 90 tablet 3  . bisacodyl (DULCOLAX) 5 MG EC tablet Take 1 tablet (5 mg total) by mouth daily as needed for moderate constipation. 30 tablet 0  . calcium-vitamin D (OSCAL WITH D) 500-200 MG-UNIT per tablet Take 1 tablet by mouth daily.      .  clopidogrel (PLAVIX) 75 MG tablet Take 1 tablet (75 mg total) by mouth daily. 90 tablet 3  . furosemide (LASIX) 40 MG tablet Take 1 tablet (40 mg total) by mouth 2 (two) times daily. Take 2 pills in the morning and 1 pill at night 60 tablet 0  . isosorbide mononitrate (IMDUR) 120 MG 24 hr tablet Take 1 tablet (120 mg total) by mouth daily. 90 tablet 3  . levothyroxine (SYNTHROID, LEVOTHROID) 50 MCG tablet Take 1 tablet (50 mcg total) by mouth daily. 90 tablet 3  . losartan (COZAAR) 50 MG tablet Take 0.5-1 tablets (25-50 mg total) by mouth daily as needed. Takes when SBP>130    . meclizine (ANTIVERT) 12.5 MG tablet Take 1 tablet (12.5 mg total) by mouth as needed for dizziness (up to three times a day). 30 tablet 0  . metoprolol succinate (TOPROL-XL) 100 MG 24 hr tablet Take 1 tablet (100 mg total) by mouth daily. Take with or immediately following a meal. 90 tablet 3  . nitroGLYCERIN (NITROLINGUAL) 0.4 MG/SPRAY spray Place 1 spray under the tongue every 5 (five) minutes x 3 doses as needed for chest pain. 12 g 3  . ondansetron (ZOFRAN) 4 MG tablet Take 1 tablet (4 mg total) by mouth every 8 (eight) hours as needed for nausea or vomiting. 20 tablet 0  . oxyCODONE (OXY IR/ROXICODONE) 5 MG immediate release tablet Take 1 tablet (5 mg total) by mouth every 6 (six) hours as needed for moderate pain. 20 tablet 0  . potassium chloride SA (K-DUR,KLOR-CON) 20 MEQ tablet Take 1 tablet (20 mEq total) by mouth 2 (two) times daily. 60 tablet 0  . promethazine-codeine (PHENERGAN WITH CODEINE) 6.25-10 MG/5ML syrup Take 5 mLs by mouth every 4 (four) hours as needed for cough. 118 mL 0  . ranolazine (RANEXA) 1000 MG SR tablet Take 1 tablet (1,000 mg total) by mouth 2 (two) times daily. 180 tablet 3   No current facility-administered medications on file prior to visit.    No Known Allergies  Past Medical History  Diagnosis Date  . Hypertension   . Hyperlipidemia   . Diabetes mellitus   . Chronic cough      Seen by Dr. Sherene Sires  . Hypothyroid   . CAD (coronary artery disease) 1989    a. 1989: s/p CABGx3;  b. 06/2007 Cath/Attempted PCI: LM min irregs, LAD 90/100, D1 90 (attempted PCI), RI 90, LCX100p, OM1 100, RCA 60/70p, 90/34m, VG->RCA nl, VG->OM 100, LIMA->LAD nl, EF 50%.  . Complete heart block (HCC)     a. s/p PPM (MDT) by Dr Deborah Chalk 2004; b.  Gen change 09/2010 (MDT ADDRL1 Adapta DC PPM Ser #:[id number]  . Carotid artery occlusion 2011    60-79%right/ 40-59% left  . GERD (gastroesophageal reflux disease)   . Chronic systolic CHF (congestive heart failure) (HCC)     a. 03/2011 Echo: EF 40% inflat HK, Gr1 DD, mod MR/TR  . Chest pain March 2014    Past Surgical History  Procedure Laterality Date  . Coronary artery bypass graft  1989  . Pacemaker placement  2004    MDT by Dr Deborah Chalk  . Cataract extraction  2004    Both  . Cardiac catheterization  2004, 2007, 2009  . Cardiac catheterization N/A 01/18/2015    Procedure: Left Heart Cath and Cors/Grafts Angiography;  Surgeon: Kathleene Hazel, MD;  Location: [facility] INVASIVE CV LAB;  Service: Cardiovascular;  Laterality: N/A;    History  Smoking status  . Former Smoker  . Quit date: 12/15/1973  Smokeless tobacco  . Never Used    History  Alcohol Use No    Family History  Problem Relation Age of Onset  . Hypertension Mother   . Liver cancer Brother   . Breast cancer Daughter   . Colon cancer Brother   . Cancer Brother     colon    Reviw of Systems:  Reviewed in the HPI.  All other systems are negative.  Physical Exam: BP 128/54 mmHg  Pulse 68  Ht 5\' 3"  (1.6 m)  Wt 123 lb (55.792 kg)  BMI 21.79 kg/m2  O2 st 93% on  2 LPM O2   The patient is alert and oriented x 3.  The mood and affect are normal.   Skin: warm and dry.  Color is normal.    HEENT:   the sclera are nonicteric.  The mucous membranes are moist.  The carotids are 2+ with soft bruits. .  There is no thyromegaly.  Pulsatile next is inferior to her carotid  arteries that are either do to a large V wave or perhaps do to her common carotid artery.  Lungs: clear.  The chest wall is non tender.    Heart: regular rate with a normal S1 and S2.  There is a soft systolic murmur The PMI is not displaced.  Her pacer is in the right subclavian.  Abdomen: good bowel sounds.  There is no guarding or rebound.  There is no hepatosplenomegaly or tenderness.  There are no masses.   Extremities:  no clubbing, cyanosis, or edema.  The legs are without rashes.  The distal pulses are diminished  Neuro:  Cranial nerves II - XII are intact.  Motor and sensory functions are intact.    The gait is normal.  ECG:     Assessment / Plan:   1. CAD - s/p CABG 1989  -  She is having   Angina but it seems to be stable .  Has angina with everyday activities. On near maximal medication - Norvasc , metooprolol 100, Imdur 120 a day , ranexa 1000 BID She has known CAD with a tight stenosis in the graft to RCA .  At her cath, there were no culprit lesions that could be revascularized   2. Pacer - for advanced AV block 3. PVD-  4. CHF - EF of 40% - she was thought to be slightly volume overloaded while in the hospital this past week. Is now on home O2.   Lasix was increased from 40 mg a day to 80 mg in AM with  40 mg in the PM  Will see her in 3 months  BMP today since her lasix and Kdur doses have been changed.   5. Diabetes Mellitus - managed by diet now.  6. Hypothyroidism 7. Bilateral carotid bruits:   Stable  8. Hyperlipidemia:  Stable , continue atorvastatin 20 , will check lipids , liver, BMP in 6 months    Jehad Bisono, Deloris Ping, MD  06/01/2015 9:12 AM    [facility] [address] [place],  [address] [place], [place]  [address] Pager [phone] Phone: [phone]; Fax: [phone]   [facility]  [address] [place], [place]  [address] [phone]   Fax [phone]

## 2015-06-01 NOTE — Patient Instructions (Addendum)
Thank you for choosing [facility].  Summary/Instructions:   Start cyclobenzaprine as needed - please be cautious as this medication may make you sleepy, dizzy, or drowsy.  Start Tylenol as needed for pain on top of oxycodone.  Ice 2-3 times per day and after activity as needed.  Salon possible or other lidocaine patches are available over-the-counter which may help with discomfort.  Deep breathing and coughing periodically throughout the day with the use of incentive spirometer to prevent pneumonia.  Recommend a stool softener while using the oxycodone to prevent constipation and/or possible straining.  Continue with follow-up with neurosurgery as scheduled.  Follow-up with Dr. Okey Dupre in one month.   Your prescription(s) have been submitted to your pharmacy or been printed and provided for you. Please take as directed and contact our office if you believe you are having problem(s) with the medication(s) or have any questions.  If your symptoms worsen or fail to improve, please contact our office for further instruction, or in case of emergency go directly to the emergency room at the closest medical facility.

## 2015-06-01 NOTE — Assessment & Plan Note (Addendum)
Back pain appears stable with pain management being labile. Discuss pain management techniques including nonpharmacological methods of lidocaine patches and ice. Recommended starting Tylenol as needed for pain in addition to the previously prescribed oxycodone. There is significant concern for increasing oxycodone given patient's shortness of breath and history of a fall. Patient is understanding of this willing to try alternative methods of pain control. Encourage incentive spirometry to prevent development of pneumonia. Continue home health physical therapy and follow-up with neurosurgery as scheduled. Continue current dosage of oxycodone. Start Colace as needed for constipation related to opioid usage.

## 2015-06-01 NOTE — Assessment & Plan Note (Signed)
Heart failure appears stable with increased dosage of furosemide and at home oxygen. SPO2 of 92% today with nasal cannula. Continue current dosage of oxygen at this time. Cardiology obtained labs and will await results.. Encouraged to continue monitoring weight at home with instructions on when to follow-up.

## 2015-06-01 NOTE — Progress Notes (Signed)
Subjective:    Patient ID: Sydney Moore, female    DOB: 01-27-1924, 82 y.o.   MRN: [id number]  Chief Complaint  Patient presents with  . Hospitalization Follow-up    the pain pills that were given to her do not work and she was supposed to get flexeril    HPI:  Sydney Moore is a 82 y.o. female who  has a past medical history of Hypertension; Hyperlipidemia; Diabetes mellitus; Chronic cough; Hypothyroid; CAD (coronary artery disease) (1989); Complete heart block (HCC); Carotid artery occlusion (2011); GERD (gastroesophageal reflux disease); Chronic systolic CHF (congestive heart failure) (HCC); and Chest pain (March 2014). and presents today for a follow up office visit.   Recently evaluated in the emergency department and admitted to the hospital following a fall with the chief complaint of back pain. Describes she is attempting to walk up steps when she fell backwards on the first episode her back on a nearby wall. Pain is described as aching and throbbing with a 9 out of 10 severity. Physical exam with bony tenderness and pain of the lumbar spine. EKG showed AV dual paced rhythm. On arrival to the ED she was hypoxic to the mid 80s on room air which improved with 2 L via nasal cannula. CT showed L1 superior endplate compression fracture with no bony retropulsion or height loss. CT of the chest showed no evidence of rib fracture or pneumothorax. She did remain hypoxic which was the main reason for her admission for diuresis and pain control. Neurosurgery was consult dictated with recommendations for TLSO brace, supportive care and follow up with neurosurgery in 2-3 weeks. She will be discharged with home physical therapy. She was also noted to have an elevated d-dimer with CT angiogram of the chest being negative for PE. She was also diagnosed with mild acute on chronic combined systolic and diastolic heart failure with an echocardiogram showing a 40% ejection fraction. Her BNP was elevated upon  admission which improved with IV Lasix. Her home dose of IV Lasix was increased from 40 mg once a day to 40 mg twice a day. She was symptom-free upon discharge with oxygen saturations of 80% on room air and will get home oxygen. Recommendations for PCP follow-up in 5-7 days to check weight, BMP, and diuretic dose as well as oxygen need as necessary. All hospital records, labs and imaging were reviewed in detail.   Continues to experience the associated symptom of pain located in her lumbar spine. Modifying factors include oxycodone with a severity of 7/10 even after the oxycodone. Pain is described as sharp and exacerbated with movement. Describes difficulties getting out of bed and simply moving around. Started working with Home Health Physical Therapy. The shortness of breath is improved with the Lasix and the oxygen use. She was seen by Cardiology this morning with BMP levels being checked.   No Known Allergies   Current Outpatient Prescriptions on File Prior to Visit  Medication Sig Dispense Refill  . amLODipine (NORVASC) 5 MG tablet Take 1 tablet (5 mg total) by mouth daily. 90 tablet 1  . aspirin 81 MG tablet Take 1 tablet (81 mg total) by mouth every morning. 90 tablet 1  . atorvastatin (LIPITOR) 20 MG tablet Take 1 tablet (20 mg total) by mouth daily. 90 tablet 1  . bisacodyl (DULCOLAX) 5 MG EC tablet Take 1 tablet (5 mg total) by mouth daily as needed for moderate constipation. 30 tablet 0  . calcium-vitamin D (OSCAL WITH D)  500-200 MG-UNIT per tablet Take 1 tablet by mouth daily.      . clopidogrel (PLAVIX) 75 MG tablet Take 1 tablet (75 mg total) by mouth daily. 90 tablet 1  . furosemide (LASIX) 40 MG tablet Take 1 tablet (40 mg total) by mouth 2 (two) times daily. Take 2 pills in the morning and 1 pill at night 180 tablet 1  . isosorbide mononitrate (IMDUR) 120 MG 24 hr tablet Take 1 tablet (120 mg total) by mouth daily. 90 tablet 1  . levothyroxine (SYNTHROID, LEVOTHROID) 50 MCG tablet  Take 1 tablet (50 mcg total) by mouth daily. 90 tablet 3  . losartan (COZAAR) 50 MG tablet Take 0.5-1 tablets (25-50 mg total) by mouth daily as needed. Takes when SBP>130 45 tablet 1  . meclizine (ANTIVERT) 12.5 MG tablet Take 1 tablet (12.5 mg total) by mouth as needed for dizziness (up to three times a day). 30 tablet 0  . metoprolol succinate (TOPROL-XL) 100 MG 24 hr tablet Take 1 tablet (100 mg total) by mouth daily. Take with or immediately following a meal. 90 tablet 1  . nitroGLYCERIN (NITROLINGUAL) 0.4 MG/SPRAY spray Place 1 spray under the tongue every 5 (five) minutes x 3 doses as needed for chest pain. 12 g 1  . ondansetron (ZOFRAN) 4 MG tablet Take 1 tablet (4 mg total) by mouth every 8 (eight) hours as needed for nausea or vomiting. 20 tablet 0  . oxyCODONE (OXY IR/ROXICODONE) 5 MG immediate release tablet Take 1 tablet (5 mg total) by mouth every 6 (six) hours as needed for moderate pain. 20 tablet 0  . potassium chloride SA (K-DUR,KLOR-CON) 20 MEQ tablet Take 1 tablet (20 mEq total) by mouth 2 (two) times daily. 180 tablet 1  . promethazine-codeine (PHENERGAN WITH CODEINE) 6.25-10 MG/5ML syrup Take 5 mLs by mouth every 4 (four) hours as needed for cough. 118 mL 0  . ranolazine (RANEXA) 1000 MG SR tablet Take 1 tablet (1,000 mg total) by mouth 2 (two) times daily. 180 tablet 1   No current facility-administered medications on file prior to visit.     Past Surgical History  Procedure Laterality Date  . Coronary artery bypass graft  1989  . Pacemaker placement  2004    MDT by Dr Deborah Chalk  . Cataract extraction  2004    Both  . Cardiac catheterization  2004, 2007, 2009  . Cardiac catheterization N/A 01/18/2015    Procedure: Left Heart Cath and Cors/Grafts Angiography;  Surgeon: Kathleene Hazel, MD;  Location: [facility] INVASIVE CV LAB;  Service: Cardiovascular;  Laterality: N/A;    Past Medical History  Diagnosis Date  . Hypertension   . Hyperlipidemia   . Diabetes mellitus     . Chronic cough     Seen by Dr. Sherene Sires  . Hypothyroid   . CAD (coronary artery disease) 1989    a. 1989: s/p CABGx3;  b. 06/2007 Cath/Attempted PCI: LM min irregs, LAD 90/100, D1 90 (attempted PCI), RI 90, LCX100p, OM1 100, RCA 60/70p, 90/7m, VG->RCA nl, VG->OM 100, LIMA->LAD nl, EF 50%.  . Complete heart block (HCC)     a. s/p PPM (MDT) by Dr Deborah Chalk 2004; b. Gen change 09/2010 (MDT ADDRL1 Adapta DC PPM Ser #:[id number]  . Carotid artery occlusion 2011    60-79%right/ 40-59% left  . GERD (gastroesophageal reflux disease)   . Chronic systolic CHF (congestive heart failure) (HCC)     a. 03/2011 Echo: EF 40% inflat HK, Gr1 DD, mod MR/TR  .  Chest pain March 2014    Review of Systems  Constitutional: Negative for fever, chills and unexpected weight change.  Respiratory: Negative for chest tightness and shortness of breath.   Cardiovascular: Negative for chest pain, palpitations and leg swelling.  Musculoskeletal: Positive for back pain.  Neurological: Negative for weakness and numbness.      Objective:    BP 130/58 mmHg  Pulse 65  Resp 14  Ht  (1.6 m)  Wt 125 lb (56.7 kg)  BMI 22.15 kg/m2  SpO2 92% Nursing note and vital signs reviewed.  Wt Readings from Last 3 Encounters:  06/01/15 125 lb (56.7 kg)  06/01/15 123 lb (55.792 kg)  05/27/15 123 lb 14.4 oz (56.2 kg)    Physical Exam  Constitutional: She is oriented to person, place, and time. She appears well-developed and well-nourished. No distress.  Seated in the chair with her walker in front of her and back brace in place.   Cardiovascular: Normal rate, regular rhythm, normal heart sounds and intact distal pulses.   Pulmonary/Chest: Effort normal and breath sounds normal. No respiratory distress. She has no wheezes. She has no rales. She exhibits no tenderness.  Musculoskeletal:  Low back - No obvious deformity, discoloration, or edema noted. Tenderness of upper/mid lumbar spine. Range of motion testing deferred  secondary to pain.   Neurological: She is alert and oriented to person, place, and time.  Skin: Skin is warm and dry.  Psychiatric: She has a normal mood and affect. Her behavior is normal. Judgment and thought content normal.       Assessment & Plan:   Problem List Items Addressed This Visit      Cardiovascular and Mediastinum   Chronic combined systolic and diastolic congestive heart failure (HCC)    Heart failure appears stable with increased dosage of furosemide and at home oxygen. SPO2 of 92% today with nasal cannula. Continue current dosage of oxygen at this time. Cardiology obtained labs and will await results.. Encouraged to continue monitoring weight at home with instructions on when to follow-up.        Musculoskeletal and Integument   Compression fracture - Primary    Back pain appears stable with pain management being labile. Discuss pain management techniques including nonpharmacological methods of lidocaine patches and ice. Recommended starting Tylenol as needed for pain in addition to the previously prescribed oxycodone. There is significant concern for increasing oxycodone given patient's shortness of breath and history of a fall. Patient is understanding of this willing to try alternative methods of pain control. Encourage incentive spirometry to prevent development of pneumonia. Continue home health physical therapy and follow-up with neurosurgery as scheduled. Continue current dosage of oxycodone. Start Colace as needed for constipation related to opioid usage.      Relevant Medications   cyclobenzaprine (FLEXERIL) 5 MG tablet       I am having Ms. Hinojosa start on cyclobenzaprine. I am also having her maintain her calcium-vitamin D, aspirin, meclizine, levothyroxine, promethazine-codeine, bisacodyl, oxyCODONE, ondansetron, amLODipine, atorvastatin, clopidogrel, furosemide, isosorbide mononitrate, losartan, metoprolol succinate, potassium chloride SA, ranolazine, and  nitroGLYCERIN.   Meds ordered this encounter  Medications  . cyclobenzaprine (FLEXERIL) 5 MG tablet    Sig: Take 1 tablet (5 mg total) by mouth 3 (three) times daily as needed for muscle spasms.    Dispense:  42 tablet    Refill:  0    Order Specific Question:  Supervising Provider    Answer:  Hillard Danker A [[id number]]  Follow-up: Return in about 1 month (around 07/01/2015), or if symptoms worsen or fail to improve.  Jeanine Luz, FNP

## 2015-06-11 ENCOUNTER — Telehealth: Payer: Self-pay | Admitting: Internal Medicine

## 2015-06-11 ENCOUNTER — Telehealth: Payer: Self-pay

## 2015-06-11 DIAGNOSIS — I5042 Chronic combined systolic (congestive) and diastolic (congestive) heart failure: Secondary | ICD-10-CM

## 2015-06-11 DIAGNOSIS — R2689 Other abnormalities of gait and mobility: Secondary | ICD-10-CM

## 2015-06-11 DIAGNOSIS — I110 Hypertensive heart disease with heart failure: Secondary | ICD-10-CM

## 2015-06-11 NOTE — Telephone Encounter (Signed)
Patient was sent home from the hospital on O2. She has had a hospital follow up visit with Tammy Sours. The home health nurse has been checking and the patient's O2 has been in the upper 90's every time it has been checked. The home health nurse wants to know if it is ok to D/C O2? Please advise, thanks.

## 2015-06-11 NOTE — Telephone Encounter (Signed)
Paperwork signed, faxed, copy sent to scan 

## 2015-06-11 NOTE — Telephone Encounter (Signed)
Patient will need an appt to see if she can d/c oxygen before Dr. Okey Moore will sign off on it. Please schedule an appt, thanks.

## 2015-06-11 NOTE — Telephone Encounter (Signed)
Left message for [facility] to call me back.

## 2015-06-11 NOTE — Telephone Encounter (Signed)
She would need visit with walk without oxygen to see if it is okay to d/c.

## 2015-06-11 NOTE — Telephone Encounter (Signed)
Home Health Cert/Plan of Care received (05/31/2015 - 07/29/2015) and placed on MD's desk for signature  

## 2015-06-11 NOTE — Telephone Encounter (Signed)
[facility] [phone]  Pt was sent home with oxygen 2 lilters.  Never been on it before.  Pt saturation has been in the upper 90's.  With oxygen off

## 2015-06-25 ENCOUNTER — Telehealth: Payer: Self-pay | Admitting: Internal Medicine

## 2015-06-25 ENCOUNTER — Telehealth: Payer: Self-pay | Admitting: Cardiovascular Disease

## 2015-06-25 DIAGNOSIS — I25708 Atherosclerosis of coronary artery bypass graft(s), unspecified, with other forms of angina pectoris: Secondary | ICD-10-CM

## 2015-06-25 DIAGNOSIS — I5042 Chronic combined systolic (congestive) and diastolic (congestive) heart failure: Secondary | ICD-10-CM

## 2015-06-25 DIAGNOSIS — I119 Hypertensive heart disease without heart failure: Secondary | ICD-10-CM

## 2015-06-25 NOTE — Telephone Encounter (Signed)
Pt calling regarding refill of all her heart meds-needs written rx for 1 [phone]

## 2015-06-25 NOTE — Telephone Encounter (Signed)
error 

## 2015-06-26 MED ORDER — FUROSEMIDE 40 MG PO TABS: 40.0000 mg | ORAL_TABLET | Freq: Two times a day (BID) | ORAL | Status: DC

## 2015-06-26 MED ORDER — RANOLAZINE ER 1000 MG PO TB12: 1000.0000 mg | ORAL_TABLET | Freq: Two times a day (BID) | ORAL | Status: AC

## 2015-06-26 MED ORDER — AMLODIPINE BESYLATE 5 MG PO TABS: 5.0000 mg | ORAL_TABLET | Freq: Every day | ORAL | Status: DC

## 2015-06-26 MED ORDER — ATORVASTATIN CALCIUM 20 MG PO TABS: 20.0000 mg | ORAL_TABLET | Freq: Every day | ORAL | Status: AC

## 2015-06-26 MED ORDER — LOSARTAN POTASSIUM 50 MG PO TABS: 25.0000 mg | ORAL_TABLET | Freq: Every day | ORAL | Status: DC | PRN

## 2015-06-26 MED ORDER — NITROGLYCERIN 0.4 MG/SPRAY TL SOLN: 1.0000 | Status: DC | PRN

## 2015-06-26 MED ORDER — CLOPIDOGREL BISULFATE 75 MG PO TABS: 75.0000 mg | ORAL_TABLET | Freq: Every day | ORAL | Status: AC

## 2015-06-26 MED ORDER — POTASSIUM CHLORIDE CRYS ER 20 MEQ PO TBCR: 20.0000 meq | EXTENDED_RELEASE_TABLET | Freq: Two times a day (BID) | ORAL | Status: AC

## 2015-06-26 MED ORDER — ISOSORBIDE MONONITRATE ER 120 MG PO TB24: 120.0000 mg | ORAL_TABLET | Freq: Every day | ORAL | Status: AC

## 2015-06-26 MED ORDER — METOPROLOL SUCCINATE ER 100 MG PO TB24: 100.0000 mg | ORAL_TABLET | Freq: Every day | ORAL | Status: AC

## 2015-06-26 NOTE — Telephone Encounter (Signed)
Spoke with patient's daughter who states patient needs to pick up written prescriptions from our office to take to [facility] to be filled.  I advised her that the printed and signed Rx will be up front for her to pick up.  She thanked me for the call.

## 2015-06-27 ENCOUNTER — Telehealth: Payer: Self-pay | Admitting: *Deleted

## 2015-06-27 MED ORDER — FUROSEMIDE 40 MG PO TABS: 40.0000 mg | ORAL_TABLET | Freq: Two times a day (BID) | ORAL | Status: DC

## 2015-06-27 NOTE — Telephone Encounter (Signed)
Patients daughter called and stated that the rx for furosemide was not included in the rx's that she picked up yesterday. She stated that her mother has an appointment close by this AM, so they will stop by the office at that time to see if it is ready.

## 2015-06-27 NOTE — Telephone Encounter (Signed)
I reviewed prescriptions that were printed with patient's daughter and she does not have furosemide Rx.  I advised that I am placing signed Rx at front for her to pick up today.  She thanked me for the call.

## 2015-06-29 ENCOUNTER — Telehealth: Payer: Self-pay

## 2015-06-29 NOTE — Telephone Encounter (Signed)
Verbal Orders:. Continue PT for 2 more Weeks. 2x a week for 2 weeks.Marland Kitchen Helping with Cane use.

## 2015-06-29 NOTE — Telephone Encounter (Signed)
Left message giving verbal orders to continue PT.

## 2015-07-02 ENCOUNTER — Telehealth: Payer: Self-pay | Admitting: Internal Medicine

## 2015-07-02 NOTE — Telephone Encounter (Signed)
NEw MEssage  Pt requests to speak w/ Device- needs assistance sending remote transmission.Please call back and discuss.

## 2015-07-02 NOTE — Telephone Encounter (Signed)
Pt is going to come into the office on Friday 07-06-15 to get help w/ her home monitor.

## 2015-07-05 ENCOUNTER — Ambulatory Visit: Payer: Medicare Other | Admitting: Internal Medicine

## 2015-07-05 DIAGNOSIS — Z0289 Encounter for other administrative examinations: Secondary | ICD-10-CM

## 2015-07-06 ENCOUNTER — Ambulatory Visit (INDEPENDENT_AMBULATORY_CARE_PROVIDER_SITE_OTHER): Payer: Medicare Other | Admitting: *Deleted

## 2015-07-06 DIAGNOSIS — I442 Atrioventricular block, complete: Secondary | ICD-10-CM

## 2015-07-06 NOTE — Progress Notes (Signed)
Remote pacemaker transmission.   

## 2015-07-11 ENCOUNTER — Telehealth: Payer: Self-pay | Admitting: Cardiovascular Disease

## 2015-07-11 NOTE — Telephone Encounter (Signed)
New Message  Pt requested to speak with an RN about readings on her device. Please call back to discuss

## 2015-07-11 NOTE — Telephone Encounter (Signed)
Returned patient's call.  Reviewed patient's remote transmission results from 07/06/15 with her.  She is appreciative of explanation and denies additional questions at this time.

## 2015-07-13 ENCOUNTER — Telehealth: Payer: Self-pay | Admitting: Internal Medicine

## 2015-07-13 NOTE — Telephone Encounter (Signed)
Patient was sent home with oxygen from last hospital stay.  States patient is not using.  Requesting Dr. Okey Dupre to send discontinued order to company at fax number [phone].  States patient is being discharged from PT with [facility].  Wants oxygen equipment picked up.  States oxygen states are good stays between 97 to 100% with activity.

## 2015-07-16 NOTE — Telephone Encounter (Signed)
We would need visit with checking oxygen with activity to D/C oxygen as we did not order.

## 2015-07-16 NOTE — Telephone Encounter (Signed)
Patient needs a 30 minute office visit for Dr. Okey Dupre to d/c home oxygen. She was not the ordering provider.

## 2015-07-17 NOTE — Telephone Encounter (Signed)
Got patient scheduled

## 2015-07-31 ENCOUNTER — Ambulatory Visit (INDEPENDENT_AMBULATORY_CARE_PROVIDER_SITE_OTHER): Payer: Medicare Other | Admitting: Internal Medicine

## 2015-07-31 ENCOUNTER — Encounter: Payer: Self-pay | Admitting: Internal Medicine

## 2015-07-31 VITALS — BP 108/60 | HR 100 | Temp 98.3°F | Resp 16 | Ht 60.0 in | Wt 124.8 lb

## 2015-07-31 DIAGNOSIS — R0902 Hypoxemia: Secondary | ICD-10-CM | POA: Diagnosis not present

## 2015-07-31 DIAGNOSIS — T148 Other injury of unspecified body region: Secondary | ICD-10-CM

## 2015-07-31 DIAGNOSIS — I25700 Atherosclerosis of coronary artery bypass graft(s), unspecified, with unstable angina pectoris: Secondary | ICD-10-CM | POA: Diagnosis not present

## 2015-07-31 DIAGNOSIS — IMO0002 Reserved for concepts with insufficient information to code with codable children: Secondary | ICD-10-CM

## 2015-07-31 NOTE — Progress Notes (Signed)
Pre visit review using our clinic review tool, if applicable. No additional management support is needed unless otherwise documented below in the visit note. 

## 2015-07-31 NOTE — Progress Notes (Signed)
   Subjective:    Patient ID: Sydney Moore, female    DOB: January 13, 1924, 82 y.o.   MRN: [id number]  HPI The patient is an 82 YO female coming in for discontinuation of oxygen. Another provider ordered it for her with activity. She states that at home the levels are staying good and she wants it gone but they need an order. Doing well and walking with a cane now only. Has had PT at home and it has helped significantly. No SOB with exertion and no cough. Denies falls at home. Does need to stop and rest frequently with walking and would benefit from walker with seat for leaving the house.   Oxygen levels on room air with 5 minutes of ambulation: 96%  Review of Systems  Constitutional: Negative for fever, chills, activity change, appetite change, fatigue and unexpected weight change.  Respiratory: Negative for cough, chest tightness, shortness of breath and wheezing.   Cardiovascular: Negative for chest pain, palpitations and leg swelling.  Gastrointestinal: Negative for nausea, vomiting, abdominal pain, diarrhea and abdominal distention.  Musculoskeletal: Positive for back pain, arthralgias and gait problem.  Skin: Negative.   Neurological: Negative for dizziness, seizures, speech difficulty, weakness, numbness and headaches.  Psychiatric/Behavioral: Negative.       Objective:   Physical Exam  Constitutional: She is oriented to person, place, and time. She appears well-developed and well-nourished.  HENT:  Head: Normocephalic and atraumatic.  Eyes: EOM are normal.  Neck: Normal range of motion.  Cardiovascular: Normal rate and regular rhythm.   Pulmonary/Chest: Effort normal and breath sounds normal. No respiratory distress. She has no wheezes. She has no rales.  Abdominal: Soft. She exhibits no distension. There is no tenderness. There is no rebound.  Musculoskeletal: She exhibits no edema.  With back brace present  Neurological: She is alert and oriented to person, place, and time.  Coordination abnormal.  Using cane for ambulation  Skin: Skin is warm and dry.  Psychiatric: She has a normal mood and affect.   Filed Vitals:   07/31/15 1323  BP: 108/60  Pulse: 100  Temp: 98.3 F (36.8 C)  TempSrc: Oral  Resp: 16  Height: 5' (1.524 m)  Weight: 124 lb 12.8 oz (56.609 kg)  SpO2: 96%      Assessment & Plan:

## 2015-07-31 NOTE — Assessment & Plan Note (Signed)
Likely this was due to acute compression fracture. She is not requiring oxygen anymore and order faxed to home health to D/C oxygen and remove equipment from the home.

## 2015-07-31 NOTE — Assessment & Plan Note (Signed)
Rx for walker with seat to help with leaving home and having stable place to stop and rest to increase her mobility.

## 2015-07-31 NOTE — Patient Instructions (Addendum)
We will get the walker sent in and the order to get rid of the oxygen.

## 2015-08-09 ENCOUNTER — Telehealth: Payer: Self-pay | Admitting: Cardiovascular Disease

## 2015-08-09 NOTE — Telephone Encounter (Signed)
New Message:   Pt is scheduled for  Surgery on 08-24-15. Have you received a clearance from Dr Ethelene Browns office?

## 2015-08-09 NOTE — Telephone Encounter (Signed)
Dr Elease Hashimoto completed surgical clearance for Sydney Moore scheduled for 08/24/15, low risk for Sydney Moore may hold Plavix for 5 days noted on form.  Pt's daughter, Vanice Sarah notified Dr Elease Hashimoto has reviewed form and may hold Plavix for 5 days noted on form.  Completed form returned to Medical Records to be faxed to Dr Jordan Likes.

## 2015-08-09 NOTE — Telephone Encounter (Signed)
Pt gave me verbal permission to speak with her daughter, Janalyn Rouse advised surgical clearance here for Dr Elease Hashimoto to review, will let her know when this has been reviewed by Dr Elease Hashimoto.

## 2015-08-10 ENCOUNTER — Telehealth: Payer: Self-pay | Admitting: *Deleted

## 2015-08-10 ENCOUNTER — Telehealth: Payer: Self-pay | Admitting: Cardiovascular Disease

## 2015-08-10 NOTE — Telephone Encounter (Signed)
Caller states she meant to call Dr. Frutoso Chase office, not Cardiology. Reiterated instructions from Dr. Elease Hashimoto for holding Plavix. She was grateful for call.

## 2015-08-10 NOTE — Telephone Encounter (Signed)
F/u  Pt dtr calling to follow up on surgical clearance for back surgery. Please call back and discuss.

## 2015-08-10 NOTE — Telephone Encounter (Signed)
Notified pt daughter with md response. She stated that Dr. Dutch Quint office fax another one this am & they did confirm that the cardiologist cleared her but he is also needing PCP clearance...Raechel Chute

## 2015-08-10 NOTE — Telephone Encounter (Signed)
Appears her cardiologist has cleared her already. I have not seen any form for her recently. They could have brought this up at their recent office visit and the form could have been filled out then. If they need it filled out they can call Dr. Lindalou Hose office or bring a copy of the form to our office.

## 2015-08-10 NOTE — Telephone Encounter (Signed)
Left msg on triage stating needing to know has MD receive medical clearance form from Dr. Dutch Quint he is wanting to do back surgery on July 14, and wanting pt to stop taking her ASA & Plavix a week prior...Raechel Chute

## 2015-08-14 LAB — CUP PACEART REMOTE DEVICE CHECK
Battery Impedance: 357 Ohm
Battery Voltage: 2.79 V
Brady Statistic AP VS Percent: 0 %
Brady Statistic AS VP Percent: 6 %
Implantable Lead Implant Date: 20040716
Implantable Lead Location: 753860
Implantable Lead Model: 4469
Implantable Lead Serial Number: 431789
Lead Channel Setting Pacing Amplitude: 2.25 V
Lead Channel Setting Pacing Pulse Width: 0.4 ms
MDC IDC LEAD IMPLANT DT: 20040716
MDC IDC LEAD LOCATION: 753859
MDC IDC LEAD SERIAL: 439074
MDC IDC MSMT BATTERY REMAINING LONGEVITY: 87 mo
MDC IDC MSMT LEADCHNL RA IMPEDANCE VALUE: 455 Ohm
MDC IDC MSMT LEADCHNL RV IMPEDANCE VALUE: 637 Ohm
MDC IDC SESS DTM: 20170526153122
MDC IDC SET LEADCHNL RV PACING AMPLITUDE: 2.5 V
MDC IDC SET LEADCHNL RV SENSING SENSITIVITY: 4 mV
MDC IDC STAT BRADY AP VP PERCENT: 94 %
MDC IDC STAT BRADY AS VS PERCENT: 0 %

## 2015-08-14 NOTE — Progress Notes (Signed)
Remote pacemaker check reviewed. Device findings within acceptable limits. Presenting AP/VP @ 83 BPM, Magnet EGM AP/VP @ 85 BPM. A & RV impedances stable by trend. Thresholds consistent with previous. Estimated longevity 7 years. 1 VHR- 5 second duration, EGM appears NSVT X 17 beats. (HX. NSVT) Due for in office follow up with Amber 11/2015.

## 2015-08-15 ENCOUNTER — Encounter: Payer: Self-pay | Admitting: Cardiology

## 2015-08-31 ENCOUNTER — Ambulatory Visit (INDEPENDENT_AMBULATORY_CARE_PROVIDER_SITE_OTHER): Payer: Medicare Other | Admitting: Cardiovascular Disease

## 2015-08-31 ENCOUNTER — Encounter: Payer: Self-pay | Admitting: Cardiovascular Disease

## 2015-08-31 VITALS — BP 98/50 | HR 100 | Ht 62.0 in | Wt 121.0 lb

## 2015-08-31 DIAGNOSIS — I5042 Chronic combined systolic (congestive) and diastolic (congestive) heart failure: Secondary | ICD-10-CM

## 2015-08-31 DIAGNOSIS — I779 Disorder of arteries and arterioles, unspecified: Secondary | ICD-10-CM | POA: Diagnosis not present

## 2015-08-31 DIAGNOSIS — I25700 Atherosclerosis of coronary artery bypass graft(s), unspecified, with unstable angina pectoris: Secondary | ICD-10-CM

## 2015-08-31 DIAGNOSIS — I2510 Atherosclerotic heart disease of native coronary artery without angina pectoris: Secondary | ICD-10-CM | POA: Diagnosis not present

## 2015-08-31 DIAGNOSIS — I25110 Atherosclerotic heart disease of native coronary artery with unstable angina pectoris: Secondary | ICD-10-CM | POA: Diagnosis not present

## 2015-08-31 DIAGNOSIS — I739 Peripheral vascular disease, unspecified: Secondary | ICD-10-CM

## 2015-08-31 LAB — BASIC METABOLIC PANEL
BUN: 17 mg/dL (ref 7–25)
CHLORIDE: 101 mmol/L (ref 98–110)
CO2: 28 mmol/L (ref 20–31)
CREATININE: 1.12 mg/dL — AB (ref 0.60–0.88)
Calcium: 8.9 mg/dL (ref 8.6–10.4)
GLUCOSE: 100 mg/dL — AB (ref 65–99)
POTASSIUM: 4.4 mmol/L (ref 3.5–5.3)
Sodium: 139 mmol/L (ref 135–146)

## 2015-08-31 MED ORDER — AMLODIPINE BESYLATE 5 MG PO TABS: 2.5000 mg | ORAL_TABLET | Freq: Every day | ORAL | Status: AC

## 2015-08-31 NOTE — Progress Notes (Signed)
Sydney Moore Date of Birth  [birth year]   Problems: 1. CAD - s/p CABG 1989  2. Pacer - for advanced AV block 3. PVD- left carotid bruit 4. CHF - EF of 40% 5. Diabetes Mellitus 6. Hypothyroidism 7. Bilateral carotid bruits:    History of Present Illness:  82 year old female with a history of coronary artery disease and pacemaker implant.  She has a history of peripheral vascular disease and has a known left carotid bruit which we are following.   She's had a history of coronary artery bypass grafting. She denies any shortness of breath or chest pain. She does complain of some leg weakness particularly with walking.  Check pacer generator changed in October. She has developed some keloid formation over the pacer incision  A recent echocardiogram reveals mild to moderate left ventricular dysfunction with an ejection fraction of 40%.  There is moderate mitral regurgitation and moderate tricuspid regurgitation. Right ventricle appears to be dilated. Her pulmonary pressures are at the upper limits of normal at 36 mmHg. Her Lasix dose was increased by Dr. Ladona Ridgel and she's feeling a bit better.  Septeber 17, 2013 - she has done well.  She has occasional episodes of CP when walking - goes away very quickly with a SL NTG.  These episodes of chest pain are very rare. She's able to do most of her normal activities without any significant problems. She has known stenosis in her ramus intermediate branch that was not amenable to PCI. We were not able to put a wire down that vessel. At that point she was started on Ranexa and has done well since that time.  June 27 ,2014: Sydney Moore is doing well.  She has occasional CP and takes NTG occasionally.  Feb. 9, 2015: Sydney Moore is doing ok.  She still has some exertional angina relieved with SL NTG.   She is able to do all of her normal daily activities without significant limitations as long as she takes the nitroglycerin.   She is on Ranexa 1000 BID.     Her BP is high this am - she has not taken her BP pills yet.  April 13, 2013:  Sydney Moore has been admitted to the hospital since I last saw her. She had a stress Myoview study which revealed a fixed apical defect and no ischemia.  Ejection fraction remained moderately reduced at 35%.  The isosorbide was increased to 120 mg a day.  She continues to have some mild chest pain but thinks that the episodes are decreased from before the hospitalization. She does use sublingual nitroglycerin on a daily basis that several times a day infact.   August 09, 2013:  She is doing well.  No significant angina.     Dec. 1, 2015:  Sydney Moore is a 82 y.o. female who I see for CAD, pacer, HTN, and mild chronic systolic CHF. Has some generalized fatigue. Has rare episodes of angina and use NTG spray.   Symptoms seem to be stable . She has requested a handicap sticker.   Aug. 4, 2016:  Still having some angina. Resolves with 1 SL NTG.  No syncope   Dec. 5, 2016  Having more angina .  Takes more NTG.  Relieves the pain .   Feb. 16, 2017:  Doing ok Still having to take NTG regularly  - several times a day .   Is on max dose Imdur  Her angina pattern is stable   June 01, 2015:  Sydney Moore is seen for follow up Larey Seat down the steps  This past weekend Has a vertebral fracture ( compression Fx of L1)  Is now on Home O2.   August 31, 2015:  Sydney Moore is seen today for followup  Had a kyphoplasty this past week . Seems to be getting better  No dizziness, Is fatigue  Some CP , takes a SL NTG    Current Outpatient Prescriptions on File Prior to Visit  Medication Sig Dispense Refill  . amLODipine (NORVASC) 5 MG tablet Take 1 tablet (5 mg total) by mouth daily. 90 tablet 3  . aspirin 81 MG tablet Take 1 tablet (81 mg total) by mouth every morning. 90 tablet 1  . atorvastatin (LIPITOR) 20 MG tablet Take 1 tablet (20 mg total) by mouth daily. 90 tablet 3  . bisacodyl (DULCOLAX) 5 MG EC tablet Take 1  tablet (5 mg total) by mouth daily as needed for moderate constipation. 30 tablet 0  . calcium-vitamin D (OSCAL WITH D) 500-200 MG-UNIT per tablet Take 1 tablet by mouth daily.      . clopidogrel (PLAVIX) 75 MG tablet Take 1 tablet (75 mg total) by mouth daily. 90 tablet 3  . cyclobenzaprine (FLEXERIL) 5 MG tablet Take 1 tablet (5 mg total) by mouth 3 (three) times daily as needed for muscle spasms. 42 tablet 0  . furosemide (LASIX) 40 MG tablet Take 1 tablet (40 mg total) by mouth 2 (two) times daily. Take 2 pills in the morning and 1 pill at night 180 tablet 3  . isosorbide mononitrate (IMDUR) 120 MG 24 hr tablet Take 1 tablet (120 mg total) by mouth daily. 90 tablet 3  . levothyroxine (SYNTHROID, LEVOTHROID) 50 MCG tablet Take 1 tablet (50 mcg total) by mouth daily. 90 tablet 3  . losartan (COZAAR) 50 MG tablet Take 0.5-1 tablets (25-50 mg total) by mouth daily as needed. Takes when SBP>130 45 tablet 3  . meclizine (ANTIVERT) 12.5 MG tablet Take 1 tablet (12.5 mg total) by mouth as needed for dizziness (up to three times a day). 30 tablet 0  . metoprolol succinate (TOPROL-XL) 100 MG 24 hr tablet Take 1 tablet (100 mg total) by mouth daily. Take with or immediately following a meal. 90 tablet 3  . nitroGLYCERIN (NITROLINGUAL) 0.4 MG/SPRAY spray Place 1 spray under the tongue every 5 (five) minutes x 3 doses as needed for chest pain. 12 g 1  . ondansetron (ZOFRAN) 4 MG tablet Take 1 tablet (4 mg total) by mouth every 8 (eight) hours as needed for nausea or vomiting. 20 tablet 0  . oxyCODONE (OXY IR/ROXICODONE) 5 MG immediate release tablet Take 1 tablet (5 mg total) by mouth every 6 (six) hours as needed for moderate pain. 20 tablet 0  . potassium chloride SA (K-DUR,KLOR-CON) 20 MEQ tablet Take 1 tablet (20 mEq total) by mouth 2 (two) times daily. 180 tablet 3  . promethazine-codeine (PHENERGAN WITH CODEINE) 6.25-10 MG/5ML syrup Take 5 mLs by mouth every 4 (four) hours as needed for cough. 118 mL 0    . ranolazine (RANEXA) 1000 MG SR tablet Take 1 tablet (1,000 mg total) by mouth 2 (two) times daily. 180 tablet 3   No current facility-administered medications on file prior to visit.    No Known Allergies  Past Medical History  Diagnosis Date  . Hypertension   . Hyperlipidemia   . Diabetes mellitus   . Chronic cough     Seen by Dr.  Wert  . Hypothyroid   . CAD (coronary artery disease) 1989    a. 1989: s/p CABGx3;  b. 06/2007 Cath/Attempted PCI: LM min irregs, LAD 90/100, D1 90 (attempted PCI), RI 90, LCX100p, OM1 100, RCA 60/70p, 90/36m, VG->RCA nl, VG->OM 100, LIMA->LAD nl, EF 50%.  . Complete heart block (HCC)     a. s/p PPM (MDT) by Dr Deborah Chalk 2004; b. Gen change 09/2010 (MDT ADDRL1 Adapta DC PPM Ser #:[id number]  . Carotid artery occlusion 2011    60-79%right/ 40-59% left  . GERD (gastroesophageal reflux disease)   . Chronic systolic CHF (congestive heart failure) (HCC)     a. 03/2011 Echo: EF 40% inflat HK, Gr1 DD, mod MR/TR  . Chest pain March 2014    Past Surgical History  Procedure Laterality Date  . Coronary artery bypass graft  1989  . Pacemaker placement  2004    MDT by Dr Deborah Chalk  . Cataract extraction  2004    Both  . Cardiac catheterization  2004, 2007, 2009  . Cardiac catheterization N/A 01/18/2015    Procedure: Left Heart Cath and Cors/Grafts Angiography;  Surgeon: Kathleene Hazel, MD;  Location: [facility] INVASIVE CV LAB;  Service: Cardiovascular;  Laterality: N/A;    History  Smoking status  . Former Smoker  . Quit date: 12/15/1973  Smokeless tobacco  . Never Used    History  Alcohol Use No    Family History  Problem Relation Age of Onset  . Hypertension Mother   . Liver cancer Brother   . Breast cancer Daughter   . Colon cancer Brother   . Cancer Brother     colon    Reviw of Systems:  Reviewed in the HPI.  All other systems are negative.  Physical Exam: BP 98/50 mmHg  Pulse 100  Ht 5\' 2"  (1.575 m)  Wt 121 lb (54.885 kg)  BMI  22.13 kg/m2  SpO2 95%  O2 st 93% on  2 LPM O2   The patient is alert and oriented x 3.  The mood and affect are normal.   Skin: warm and dry.  Color is normal.    HEENT:   the sclera are nonicteric.  The mucous membranes are moist.  The carotids are 2+ with soft bruits. .  There is no thyromegaly.  Pulsatile next is inferior to her carotid arteries that are either do to a large V wave or perhaps do to her common carotid artery.  Lungs: clear.  The chest wall is non tender.    Heart: regular rate with a normal S1 and S2.  There is a soft systolic murmur The PMI is not displaced.  Her pacer is in the right subclavian.  Abdomen: good bowel sounds.  There is no guarding or rebound.  There is no hepatosplenomegaly or tenderness.  There are no masses.   Extremities:  no clubbing, cyanosis, or edema.  The legs are without rashes.  The distal pulses are diminished  Neuro:  Cranial nerves II - XII are intact.  Motor and sensory functions are intact.    The gait is normal.  ECG:     Assessment / Plan:   1. CAD - s/p CABG 1989  -  She is having   Angina but it seems to be stable .  Has angina with everyday activities. On near maximal medication - Norvasc , metooprolol 100, Imdur 120 a day , ranexa 1000 BID She has known CAD with a tight stenosis in the graft  to RCA .  At her cath, there were no culprit lesions that could be revascularized   2. Pacer - for advanced AV block 3. PVD-  4. CHF - EF of 40% -  Off the Losartan due to hypotension    Will see her in 3 months  BMP today    5. Diabetes Mellitus - managed by diet now.  6. Hypothyroidism 7. Bilateral carotid bruits:   Stable  8. Hyperlipidemia:  Stable , continue atorvastatin 20 ,   9. HTN - BP is actually low now. Will decrease the amlodipine to 2.5 a day   Kristeen Miss, MD  08/31/2015 10:29 AM    [facility] [address] [place],  [address] [place], [place]  [address] Pager [phone] Phone: [phone]; Fax: [phone]

## 2015-08-31 NOTE — Patient Instructions (Addendum)
Medication Instructions:  DECREASE Amlodipine to 2.5 mg daily STOP Losartan  Labwork: TODAY - basic metabolic panel   Testing/Procedures: None Ordered    Follow-Up: Your physician recommends that you return on Tuesday October 24 at 11:00 am for follow-up with Dr. Elease Hashimoto   If you need a refill on your cardiac medications before your next appointment, please call your pharmacy.   Thank you for choosing [facility]! Eligha Bridegroom, RN [phone]

## 2015-09-13 ENCOUNTER — Ambulatory Visit (INDEPENDENT_AMBULATORY_CARE_PROVIDER_SITE_OTHER): Payer: Medicare Other | Admitting: Internal Medicine

## 2015-09-13 ENCOUNTER — Encounter: Payer: Self-pay | Admitting: Internal Medicine

## 2015-09-13 DIAGNOSIS — M8080XD Other osteoporosis with current pathological fracture, unspecified site, subsequent encounter for fracture with routine healing: Secondary | ICD-10-CM | POA: Diagnosis not present

## 2015-09-13 DIAGNOSIS — I25700 Atherosclerosis of coronary artery bypass graft(s), unspecified, with unstable angina pectoris: Secondary | ICD-10-CM

## 2015-09-13 DIAGNOSIS — M8080XA Other osteoporosis with current pathological fracture, unspecified site, initial encounter for fracture: Secondary | ICD-10-CM | POA: Insufficient documentation

## 2015-09-13 NOTE — Assessment & Plan Note (Signed)
Will start prolia for her osteoporosis with lumbar compression fracture. Creatinine and recent CMP is acceptable.

## 2015-09-13 NOTE — Progress Notes (Signed)
   Subjective:    Patient ID: Sydney Moore, female    DOB: 1923-05-02, 82 y.o.   MRN: [id number]  HPI The patient is an 82 YO female coming in for needing bone density scan. She recently had back surgery for a lumbar compression fracture recently. They would like her to start on bone density medication and she does not want to start another pill if possible. She does have fairly severe GERD and has heard that they make this worse. She is doing well after the surgery and is eating and drinking normally. Her cardiologist stopped her losartan for low BP at her last visit with them.   Review of Systems  Constitutional: Negative for activity change, appetite change, chills, fatigue, fever and unexpected weight change.  Respiratory: Negative for cough, chest tightness, shortness of breath and wheezing.   Cardiovascular: Negative for chest pain, palpitations and leg swelling.  Gastrointestinal: Negative for abdominal distention, abdominal pain, diarrhea, nausea and vomiting.  Musculoskeletal: Positive for back pain. Negative for arthralgias and gait problem.       Improving  Skin: Negative.   Neurological: Negative for dizziness, seizures, speech difficulty, weakness, numbness and headaches.      Objective:   Physical Exam  Constitutional: She is oriented to person, place, and time. She appears well-developed and well-nourished.  HENT:  Head: Normocephalic and atraumatic.  Eyes: EOM are normal.  Neck: Normal range of motion.  Cardiovascular: Normal rate and regular rhythm.   Pulmonary/Chest: Effort normal and breath sounds normal. No respiratory distress. She has no wheezes. She has no rales.  Abdominal: Soft. She exhibits no distension. There is no tenderness. There is no rebound.  Musculoskeletal: She exhibits no edema.  Neurological: She is alert and oriented to person, place, and time. Coordination abnormal.  Using cane for ambulation  Skin: Skin is warm and dry.   Vitals:   09/13/15  1353  BP: 138/68  Pulse: 87  Resp: 14  Temp: 98.3 F (36.8 C)  TempSrc: Oral  SpO2: 96%  Weight: 124 lb (56.2 kg)  Height: 5\' 3"  (1.6 m)      Assessment & Plan:

## 2015-09-13 NOTE — Patient Instructions (Signed)
We will start the process to get prolia approved for the osteoporosis which is a shot that you do in the office twice a year.   If your insurance will not approve it then we will try the once a week medicine called boniva.

## 2015-09-13 NOTE — Progress Notes (Signed)
Pre visit review using our clinic review tool, if applicable. No additional management support is needed unless otherwise documented below in the visit note. 

## 2015-10-05 ENCOUNTER — Telehealth: Payer: Self-pay | Admitting: Cardiovascular Disease

## 2015-10-05 ENCOUNTER — Ambulatory Visit (INDEPENDENT_AMBULATORY_CARE_PROVIDER_SITE_OTHER): Payer: Medicare Other | Admitting: Physician Assistant

## 2015-10-05 ENCOUNTER — Encounter: Payer: Self-pay | Admitting: Physician Assistant

## 2015-10-05 VITALS — BP 112/60 | HR 68 | Ht 63.0 in | Wt 126.0 lb

## 2015-10-05 DIAGNOSIS — I5023 Acute on chronic systolic (congestive) heart failure: Secondary | ICD-10-CM | POA: Diagnosis not present

## 2015-10-05 DIAGNOSIS — Z950 Presence of cardiac pacemaker: Secondary | ICD-10-CM

## 2015-10-05 DIAGNOSIS — I6523 Occlusion and stenosis of bilateral carotid arteries: Secondary | ICD-10-CM | POA: Diagnosis not present

## 2015-10-05 DIAGNOSIS — E118 Type 2 diabetes mellitus with unspecified complications: Secondary | ICD-10-CM

## 2015-10-05 DIAGNOSIS — I25708 Atherosclerosis of coronary artery bypass graft(s), unspecified, with other forms of angina pectoris: Secondary | ICD-10-CM | POA: Diagnosis not present

## 2015-10-05 DIAGNOSIS — R0602 Shortness of breath: Secondary | ICD-10-CM

## 2015-10-05 DIAGNOSIS — I110 Hypertensive heart disease with heart failure: Secondary | ICD-10-CM

## 2015-10-05 LAB — BASIC METABOLIC PANEL
BUN: 14 mg/dL (ref 7–25)
CHLORIDE: 100 mmol/L (ref 98–110)
CO2: 28 mmol/L (ref 20–31)
Calcium: 9.1 mg/dL (ref 8.6–10.4)
Creat: 1.03 mg/dL — ABNORMAL HIGH (ref 0.60–0.88)
GLUCOSE: 78 mg/dL (ref 65–99)
POTASSIUM: 4.9 mmol/L (ref 3.5–5.3)
SODIUM: 137 mmol/L (ref 135–146)

## 2015-10-05 MED ORDER — FUROSEMIDE 40 MG PO TABS: ORAL_TABLET | ORAL | 3 refills | Status: AC

## 2015-10-05 NOTE — Telephone Encounter (Signed)
Spoke with patient's daughter. States patient is SOB especially when she tries to lie down at night.  She sat up most of last night, not much sleep.  Since yesterday she is WHEEZING.  This started 2 days ago.  Weights at home are stable, around 121 #.  Has only been taking lasix 40 mg once daily.  It is ordered BID.  Patient's daughter stated they gave it to her twice a day in the hospital recently (July when she was admitted for a surgery), but that since then she has only been taking it daily.  Added patient to same day schedule for APP today to be evaluated. Daughter is aware of the appointment and is thankful for the assistance.

## 2015-10-05 NOTE — Patient Instructions (Signed)
Your physician has recommended you make the following change in your medication: START  FUROSEMIDE  40 MG  2 TABS  EVERY AM  AND  1  TAB  EVERY PM  Your physician recommends that you return for lab work in:  TODAY  BMET BNP  Your physician recommends that you schedule a follow-up appointment in: 3 MONTHS  WITH  DR  Elease Hashimoto

## 2015-10-05 NOTE — Telephone Encounter (Signed)
New message   Daughter calling     Pt C/O Shortness Of Breath: STAT if SOB developed within the last 24 hours or pt is noticeably SOB on the phone  1. Are you currently SOB (can you hear that pt is SOB on the phone)? Daughter Corrie Dandy on the phone   2. How long have you been experiencing SOB? Per Daughter - couple of days   3. Are you SOB when sitting or when up moving around? Laying down   4. Are you currently experiencing any other symptoms? Wheezing

## 2015-10-05 NOTE — Telephone Encounter (Signed)
Agree with by Lendon Ka, RN Ok to take BID lasix for several days

## 2015-10-05 NOTE — Progress Notes (Signed)
Cardiology Office Note    Date:  10/05/2015   ID:  Sydney ReachBertha L Toback, DOB November 05, 1923, MRN [id number]  PCP:  Myrlene BrokerElizabeth A Crawford, MD  Cardiologist:  Dr. Elease HashimotoNahser EP: Dr. Ladona Ridgelaylor  CC: added onto my schedule today for orthopnea and SOB  History of Present Illness:  Sydney Moore is a 82 y.o. female with a history of CAD s/p CABG 1989, advanced heart block s/p PPM, carotid artery disease, chronic systolic CHF (EF 78%40%), DMT2, hypothyroidism and valvular heart disease (mod MR/TR) who presents to clinic for evaluation of orthopnea and wheezing.    She was seen by Dr. Elease HashimotoNahser recently on 08/31/15 and was doing well 1 week s/p Kyphoplasty. She noted having to take occasional SL NTG for chest pain which was felt to be c/w stable angina. She was continued on Norvasc (dose decreased to 2.5mg  daily due to soft BP), metoprolol 100, Imdur 120 a day and ranexa 1000 BID. She has known CAD with a tight stenosis in the graft to RCA. At her last cath 01/2015, there were no culprit lesions that could be revascularized.   Review of phone notes today reveal that her daughter called reported that the patient is SOB especially when she tries to lie down at night. She sat up most of last night, not much sleep.  Since yesterday she is wheezing as well. Weights at home are stable, around 121 lbs. She also reported that she has only been taking lasix 40 mg once daily even though is ordered 2 in the AM and 1 in PM.  Patient's daughter stated they gave it to her twice a day in the hospital recently (July when she was admitted for a surgery), but that since then she has only been taking it daily.  She presents to clinic for follow up. She has been having orthopnea and DOE. No LE edema but perhaps some abdominal distension. No more chest pain than usual. No palpitations, dizziness or syncope. No other complaints besides some SOB and orthopnea.     Past Medical History:  Diagnosis Date  . CAD (coronary artery disease) 1989   a. 1989: s/p CABGx3;  b. 06/2007 Cath/Attempted PCI: LM min irregs, LAD 90/100, D1 90 (attempted PCI), RI 90, LCX100p, OM1 100, RCA 60/70p, 90/5443m, VG->RCA nl, VG->OM 100, LIMA->LAD nl, EF 50%.  . Carotid artery occlusion 2011   60-79%right/ 40-59% left  . Chest pain March 2014  . Chronic cough    Seen by Dr. Sherene SiresWert  . Chronic systolic CHF (congestive heart failure) (HCC)    a. 03/2011 Echo: EF 40% inflat HK, Gr1 DD, mod MR/TR  . Complete heart block (HCC)    a. s/p PPM (MDT) by Dr Deborah Chalkennant 2004; b. Gen change 09/2010 (MDT ADDRL1 Adapta DC PPM Ser #:[id number]  . Diabetes mellitus   . GERD (gastroesophageal reflux disease)   . Hyperlipidemia   . Hypertension   . Hypothyroid     Past Surgical History:  Procedure Laterality Date  . CARDIAC CATHETERIZATION  2004, 2007, 2009  . CARDIAC CATHETERIZATION N/A 01/18/2015   Procedure: Left Heart Cath and Cors/Grafts Angiography;  Surgeon: Kathleene Hazelhristopher D McAlhany, MD;  [facility];  Service: Cardiovascular;  Laterality: N/A;  . CATARACT EXTRACTION  2004   Both  . CORONARY ARTERY BYPASS GRAFT  1989  . PACEMAKER PLACEMENT  2004   MDT by Dr Deborah Chalkennant    Current Medications: Outpatient Medications Prior to Visit  Medication Sig Dispense Refill  . amLODipine (  NORVASC) 5 MG tablet Take 0.5 tablets (2.5 mg total) by mouth daily. 90 tablet 3  . aspirin 81 MG tablet Take 1 tablet (81 mg total) by mouth every morning. 90 tablet 1  . atorvastatin (LIPITOR) 20 MG tablet Take 1 tablet (20 mg total) by mouth daily. 90 tablet 3  . calcium-vitamin D (OSCAL WITH D) 500-200 MG-UNIT per tablet Take 1 tablet by mouth daily.      . clopidogrel (PLAVIX) 75 MG tablet Take 1 tablet (75 mg total) by mouth daily. 90 tablet 3  . isosorbide mononitrate (IMDUR) 120 MG 24 hr tablet Take 1 tablet (120 mg total) by mouth daily. 90 tablet 3  . levothyroxine (SYNTHROID, LEVOTHROID) 50 MCG tablet Take 1 tablet (50 mcg total) by mouth daily. 90 tablet 3  . meclizine  (ANTIVERT) 12.5 MG tablet Take 1 tablet (12.5 mg total) by mouth as needed for dizziness (up to three times a day). 30 tablet 0  . metoprolol succinate (TOPROL-XL) 100 MG 24 hr tablet Take 1 tablet (100 mg total) by mouth daily. Take with or immediately following a meal. 90 tablet 3  . nitroGLYCERIN (NITROLINGUAL) 0.4 MG/SPRAY spray Place 1 spray under the tongue every 5 (five) minutes x 3 doses as needed for chest pain. 12 g 1  . ondansetron (ZOFRAN) 4 MG tablet Take 1 tablet (4 mg total) by mouth every 8 (eight) hours as needed for nausea or vomiting. 20 tablet 0  . oxyCODONE (OXY IR/ROXICODONE) 5 MG immediate release tablet Take 1 tablet (5 mg total) by mouth every 6 (six) hours as needed for moderate pain. 20 tablet 0  . potassium chloride SA (K-DUR,KLOR-CON) 20 MEQ tablet Take 1 tablet (20 mEq total) by mouth 2 (two) times daily. 180 tablet 3  . promethazine-codeine (PHENERGAN WITH CODEINE) 6.25-10 MG/5ML syrup Take 5 mLs by mouth every 4 (four) hours as needed for cough. 118 mL 0  . ranolazine (RANEXA) 1000 MG SR tablet Take 1 tablet (1,000 mg total) by mouth 2 (two) times daily. 180 tablet 3  . furosemide (LASIX) 40 MG tablet Take 1 tablet (40 mg total) by mouth 2 (two) times daily. Take 2 pills in the morning and 1 pill at night (Patient taking differently: Take 40 mg by mouth 2 (two) times daily at 10 AM and 5 PM. ) 180 tablet 3   No facility-administered medications prior to visit.      Allergies:   Review of patient's allergies indicates no known allergies.   Social History   Social History  . Marital status: Widowed    Spouse name: N/A  . Number of children: 2  . Years of education: N/A   Occupational History  . 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 Smoking status: Former Smoker    Quit date: 12/15/1973  . Smokeless tobacco: Never Used  . Alcohol use No  . Drug use: No  . Sexual activity: No   Other Topics Concern  . None   Social  History Narrative   Health Care POA: son, Nathali Vent   Emergency Contact: son, Aren Cherne [phone]   End of Life Plan:    Who lives with you: Self   Any pets: none   Diet: Patient has a varied diet of protein, starch, vegetables, and fruit   Exercise: Patient does group exercises 2x week and walks 2x week.   Seatbelts: Patient reports wearing seatbelt when in vehicle.    Wynelle Link Exposure/Protection:  Patient reports not using sun protection.   Hobbies: Church, watching tv, walking           Family History:  The patient's family history includes Breast cancer in her daughter; Cancer in her brother; Colon cancer in her brother; Hypertension in her mother; Liver cancer in her brother.     ROS:   Please see the history of present illness.    ROS All other systems reviewed and are negative.   PHYSICAL EXAM:   VS:  BP 112/60   Pulse 68   Ht 5\' 3"  (1.6 m)   Wt 126 lb (57.2 kg)   SpO2 96%   BMI 22.32 kg/m    GEN: Well nourished, well developed, in no acute distress  HEENT: normal  Neck: mildly elevated neck veins, carotid bruits, or masses Cardiac: RRR; no murmurs, rubs, or gallops,no edema  Respiratory:  clear to auscultation bilaterally, normal work of breathing GI: soft, nontender, nondistended, + BS MS: no deformity or atrophy  Skin: warm and dry, no rash Neuro:  Alert and Oriented x 3, Strength and sensation are intact Psych: euthymic mood, full affect  Wt Readings from Last 3 Encounters:  10/05/15 126 lb (57.2 kg)  09/13/15 124 lb (56.2 kg)  08/31/15 121 lb (54.9 kg)      Studies/Labs Reviewed:   EKG:  EKG is NOT ordered today.    Recent Labs: 01/01/2015: TSH 3.64 05/26/2015: ALT 19; B Natriuretic Peptide 665.9 05/27/2015: Hemoglobin 11.0; Hemoglobin 10.8; Platelets 188; Platelets 184 08/31/2015: BUN 17; Creat 1.12; Potassium 4.4; Sodium 139   Lipid Panel    Component Value Date/Time   CHOL 128 01/15/2015 0906   TRIG 113 01/15/2015 0906   HDL 41 (L)  01/15/2015 0906   CHOLHDL 3.1 01/15/2015 0906   VLDL 23 01/15/2015 0906   LDLCALC 64 01/15/2015 0906   LDLDIRECT 58 12/18/2008 2048    Additional studies/ records that were reviewed today include:  2D ECHO 03/25/2011 LV EF: 40% Study Conclusions: - Left ventricle: There is septal dyssynergy from pacing. There is inferolateral hypokinesis. The cavity size was normal. Wall thickness was increased in a pattern of moderate LVH. The estimated ejection fraction was 40%. Doppler parameters are consistent with abnormal left ventricular relaxation (grade 1 diastolic dysfunction). - Aortic valve: Sclerosis without stenosis. Trivial regurgitation. - Mitral valve: Moderate regurgitation. - Right ventricle: The RV is not seen well. I suspect some RV dilitation and RV dysfunction. I don't see the pacer well. - Right atrium: The atrium was mildly dilated. - Tricuspid valve: Moderate regurgitation. - Pulmonary arteries: PA peak pressure: 15mm Hg (S).  Cath 01/2015 Procedures  Left Heart Cath and Cors/Grafts Angiography  Conclusion    Dist RCA lesion, 100% stenosed.  Ost RCA to Mid RCA lesion, 40% stenosed.  SVG was injected is normal in caliber, and is anatomically normal.  Ost Ramus to Ramus lesion, 100% stenosed.  Prox Cx lesion, 50% stenosed.  Mid Cx lesion, 100% stenosed.  Origin lesion, before Post Atrio, 30% stenosed.  Ost LAD to Prox LAD lesion, 100% stenosed.  SVG was injected is normal in caliber.  There is severe disease in the graft.  Occluded at the ostium. Chronic.  Origin lesion, 100% stenosed.  LIMA was injected is normal in caliber, and is anatomically normal.  Origin lesion, before Dist LAD, 40% stenosed.   1. Severe triple vessel CAD s/p 3 V CABG with 2/3 patent grafts.  2. The RCA has diffuse proximal and mid 50% stenosis  followed by total occlusion in the mid vessel. The distal vessel including the PDA and PLA fill by the patent vein  graft. There are collaterals from the distal RCA branches that fill the distal Circumflex and intermediate branch.  3. The Circumflex has a 50% stenosis in the proximal segment followed by a small marginal branch and then total occlusion. The distal Circumflex fills from right to left collaterals  (through the vein graft to RCA) 4. The intermediate branch is occluded at the ostium. This vessel fills from right to left collaterals (through the vein graft to RCA) 5. The LAD is occluded proximally. The entire vessel fills from the patent LIMA graft.  6. LIMA to mid LAD is patent. The ostium of the LIMA graft has a 40% stenosis  7. SVG to RCA is patent with 40% proximal body of graft stenosis 8. SVG to OM is occluded (known, chronic)  Recommendations: She has no focal targets for PCI although she does have diffuse CAD. The two grafts that were patent in 2009 are still open. The change from 2009 is that the intermediate branch is now occluded but it appears to fill from the patent vein graft to the RCA. I would continue medical management for now.       ASSESSMENT & PLAN:   Acute on chronic systolic CHF: EF of 40% by echo in 2013. She has been taking her lasix 40mg  x2 in the AM and 40mg  x1 in the PM incorrectly and only taking once daily. This is likely the cause of her SOB and orthopnea although she doesn't appear markledly volume overloaded on exam (maybe neck veins a little elevated). Her weight is up 5 lbs since seeing Dr. Elease Hashimoto on 08/31/15 (121--> 126 lbs). I will order a BNP and BMET and increase lasix back to normal dosing and see how she does. She is off Losartan due to hypotension. Continue Toprol XL 100mg  daily. I will update 2D ECHO as last one was from 2013.   CAD s/p CABG 1989: stable angina on near maximal antianginal medication. Continue Norvasc 2.5mg  daily, Toprol XL 100 mg daily , Imdur 120 a day ,and ranexa 1000 BID. She has known CAD with a tight stenosis in the graft to RCA.  At her  cath in 01/2015, there were no culprit lesions that could be revascularized. Continue Aspirin and statin.   Pacer: followed remotely. Continue follow up with Dr. Ladona Ridgel  PVD: continue follow up with [facility]  Diabetes Mellitus:  managed by diet now.   Hypothyroidism: TSH normal in 12/2014. Followed by PCP  Hyperlipidemia:  LDL at goal in 01/2015. Continue atorvastatin 20    HTN: BP stable today.     Medication Adjustments/Labs and Tests Ordered: Current medicines are reviewed at length with the patient today.  Concerns regarding medicines are outlined above.  Medication changes, Labs and Tests ordered today are listed in the Patient Instructions below. Patient Instructions  Your physician has recommended you make the following change in your medication: START  FUROSEMIDE  40 MG  2 TABS  EVERY AM  AND  1  TAB  EVERY PM  Your physician recommends that you return for lab work in:  TODAY  BMET BNP  Your physician recommends that you schedule a follow-up appointment in: 3 MONTHS  WITH  DR  Elease Hashimoto     Signed, Cline Crock, PA-C  10/05/2015 2:50 PM    [facility] [address] [place], [place], [place]  [address] Phone: [phone]; Fax: [phone]

## 2015-10-06 LAB — BRAIN NATRIURETIC PEPTIDE: Brain Natriuretic Peptide: 3716.9 pg/mL — ABNORMAL HIGH (ref <100)

## 2015-10-11 ENCOUNTER — Telehealth: Payer: Self-pay | Admitting: Cardiology

## 2015-10-11 ENCOUNTER — Ambulatory Visit (INDEPENDENT_AMBULATORY_CARE_PROVIDER_SITE_OTHER): Payer: Medicare Other | Admitting: *Deleted

## 2015-10-11 DIAGNOSIS — Z950 Presence of cardiac pacemaker: Secondary | ICD-10-CM

## 2015-10-11 DIAGNOSIS — I442 Atrioventricular block, complete: Secondary | ICD-10-CM

## 2015-10-11 NOTE — Telephone Encounter (Signed)
Confirmed remote transmission w/ pt daughter.   

## 2015-10-12 ENCOUNTER — Encounter: Payer: Self-pay | Admitting: Cardiology

## 2015-10-12 NOTE — Progress Notes (Signed)
Remote pacemaker transmission.   

## 2015-10-16 LAB — CUP PACEART REMOTE DEVICE CHECK
Battery Impedance: 407 Ohm
Battery Voltage: 2.78 V
Brady Statistic AP VS Percent: 0 %
Brady Statistic AS VP Percent: 7 %
Implantable Lead Implant Date: 20040716
Implantable Lead Location: 753860
Implantable Lead Model: 4469
Implantable Lead Model: 4470
Implantable Lead Serial Number: 431789
Implantable Lead Serial Number: 439074
Lead Channel Impedance Value: 576 Ohm
Lead Channel Pacing Threshold Amplitude: 0.625 V
Lead Channel Pacing Threshold Pulse Width: 0.4 ms
Lead Channel Pacing Threshold Pulse Width: 0.4 ms
Lead Channel Setting Sensing Sensitivity: 4 mV
MDC IDC LEAD IMPLANT DT: 20040716
MDC IDC LEAD LOCATION: 753859
MDC IDC MSMT BATTERY REMAINING LONGEVITY: 83 mo
MDC IDC MSMT LEADCHNL RA IMPEDANCE VALUE: 441 Ohm
MDC IDC MSMT LEADCHNL RA PACING THRESHOLD AMPLITUDE: 1 V
MDC IDC SESS DTM: 20170831161925
MDC IDC SET LEADCHNL RA PACING AMPLITUDE: 2 V
MDC IDC SET LEADCHNL RV PACING AMPLITUDE: 2.5 V
MDC IDC SET LEADCHNL RV PACING PULSEWIDTH: 0.4 ms
MDC IDC STAT BRADY AP VP PERCENT: 93 %
MDC IDC STAT BRADY AS VS PERCENT: 0 %

## 2015-10-19 ENCOUNTER — Encounter (HOSPITAL_COMMUNITY): Payer: Self-pay | Admitting: *Deleted

## 2015-10-19 ENCOUNTER — Other Ambulatory Visit: Payer: Self-pay

## 2015-10-19 ENCOUNTER — Ambulatory Visit (HOSPITAL_COMMUNITY): Payer: Medicare Other | Attending: Cardiovascular Disease

## 2015-10-19 DIAGNOSIS — I442 Atrioventricular block, complete: Secondary | ICD-10-CM | POA: Insufficient documentation

## 2015-10-19 DIAGNOSIS — I071 Rheumatic tricuspid insufficiency: Secondary | ICD-10-CM | POA: Diagnosis not present

## 2015-10-19 DIAGNOSIS — I2510 Atherosclerotic heart disease of native coronary artery without angina pectoris: Secondary | ICD-10-CM | POA: Insufficient documentation

## 2015-10-19 DIAGNOSIS — I351 Nonrheumatic aortic (valve) insufficiency: Secondary | ICD-10-CM | POA: Insufficient documentation

## 2015-10-19 DIAGNOSIS — Z87891 Personal history of nicotine dependence: Secondary | ICD-10-CM | POA: Insufficient documentation

## 2015-10-19 DIAGNOSIS — I252 Old myocardial infarction: Secondary | ICD-10-CM | POA: Diagnosis not present

## 2015-10-19 DIAGNOSIS — I340 Nonrheumatic mitral (valve) insufficiency: Secondary | ICD-10-CM | POA: Insufficient documentation

## 2015-10-19 DIAGNOSIS — E785 Hyperlipidemia, unspecified: Secondary | ICD-10-CM | POA: Diagnosis not present

## 2015-10-19 DIAGNOSIS — I509 Heart failure, unspecified: Secondary | ICD-10-CM | POA: Insufficient documentation

## 2015-10-19 DIAGNOSIS — E119 Type 2 diabetes mellitus without complications: Secondary | ICD-10-CM | POA: Insufficient documentation

## 2015-10-19 DIAGNOSIS — Z951 Presence of aortocoronary bypass graft: Secondary | ICD-10-CM | POA: Insufficient documentation

## 2015-10-19 DIAGNOSIS — I110 Hypertensive heart disease with heart failure: Secondary | ICD-10-CM | POA: Insufficient documentation

## 2015-10-19 DIAGNOSIS — R0602 Shortness of breath: Secondary | ICD-10-CM | POA: Insufficient documentation

## 2015-10-19 DIAGNOSIS — R29898 Other symptoms and signs involving the musculoskeletal system: Secondary | ICD-10-CM | POA: Diagnosis not present

## 2015-10-19 MED ORDER — PERFLUTREN LIPID MICROSPHERE
1.0000 mL | INTRAVENOUS | Status: AC | PRN
  Administered 2015-10-19: 1 mL via INTRAVENOUS

## 2015-10-19 NOTE — Progress Notes (Signed)
2D Echo complete.  The 2D images were suspicious for Apical Thrombus, Definity Image Enhancing Agent was used.  It is difficult to differentiate between a mural thrombus, or false tendons and trabeculations of the apex.  The EF is estimated at 25%, and there is also regurgitation noted from all 4 valves.  Results were reported to Dr. Katrinka Blazing, DOD.  The patient is on blood thinners, and is not complaining of any symptoms besides her chronic wheezing.  He feels comfortable releasing her today, to follow up with Dr. Elease Hashimoto for the results.    Farrel Conners, RDCS

## 2015-10-19 NOTE — Progress Notes (Signed)
The LV function is markedly depressed. This is the cause of her fatigue and shortness of breath. Continue medical therapy .    There was no apical thrombus. Continue medical therapy

## 2015-10-22 NOTE — Patient Instructions (Signed)
Notified patient's daughter, Corrie Dandy ([place]), of results and plan to continue current medical therapy.  I advised her to call back if patient's symptoms worsen or with questions or concerns.  She verbalized understanding and agreement with plan.

## 2015-10-29 ENCOUNTER — Telehealth: Payer: Self-pay | Admitting: Cardiovascular Disease

## 2015-10-29 ENCOUNTER — Encounter: Payer: Self-pay | Admitting: Cardiovascular Disease

## 2015-10-29 ENCOUNTER — Ambulatory Visit (INDEPENDENT_AMBULATORY_CARE_PROVIDER_SITE_OTHER): Payer: Medicare Other | Admitting: Cardiovascular Disease

## 2015-10-29 VITALS — BP 126/74 | HR 98 | Ht 63.0 in | Wt 122.4 lb

## 2015-10-29 DIAGNOSIS — I5022 Chronic systolic (congestive) heart failure: Secondary | ICD-10-CM | POA: Diagnosis not present

## 2015-10-29 DIAGNOSIS — I6523 Occlusion and stenosis of bilateral carotid arteries: Secondary | ICD-10-CM | POA: Diagnosis not present

## 2015-10-29 DIAGNOSIS — I2510 Atherosclerotic heart disease of native coronary artery without angina pectoris: Secondary | ICD-10-CM

## 2015-10-29 MED ORDER — NITROGLYCERIN 0.4 MG/SPRAY TL SOLN: 1.0000 | 3 refills | Status: AC | PRN

## 2015-10-29 MED ORDER — ALBUTEROL SULFATE HFA 108 (90 BASE) MCG/ACT IN AERS: 2.0000 | INHALATION_SPRAY | Freq: Four times a day (QID) | RESPIRATORY_TRACT | 2 refills | Status: AC | PRN

## 2015-10-29 NOTE — Telephone Encounter (Signed)
Spoke with patient's daughter, Corrie Dandy, who states patient has right foot swelling that started yesterday.  She is with her mother who I can hear talking in the background.  She states patient has mild wheezing; she has chronic SOB, states it may be slightly worse today. She was seen by Carlean Jews, PA on 8/25 and lasix dose was increased to 80 mg in the am and 40 mg in the afternoon.  She states patient's BP has been "good."  Reports 100's mmHg systolic; cannot recall diastolic reading and pulse 60's bpm. She continue to take amlodipine 2.5 mg daily. I advised that I will review with Dr. Elease Hashimoto and call back with his advice.  Mary verbalized understanding and agreement.

## 2015-10-29 NOTE — Telephone Encounter (Signed)
error 

## 2015-10-29 NOTE — Telephone Encounter (Signed)
Lets see if we can work her in today or sometime soon

## 2015-10-29 NOTE — Progress Notes (Signed)
Sydney Moore Date of Birth  [birth year]   Problems: 1. CAD - s/p CABG 1989  2. Pacer - for advanced AV block 3. PVD- left carotid bruit 4. CHF - EF of 40% 5. Diabetes Mellitus 6. Hypothyroidism 7. Bilateral carotid bruits:    History of Present Illness:  82 year old female with a history of coronary artery disease and pacemaker implant.  She has a history of peripheral vascular disease and has a known left carotid bruit which we are following.   She's had a history of coronary artery bypass grafting. She denies any shortness of breath or chest pain. She does complain of some leg weakness particularly with walking.  Check pacer generator changed in October. She has developed some keloid formation over the pacer incision  A recent echocardiogram reveals mild to moderate left ventricular dysfunction with an ejection fraction of 40%.  There is moderate mitral regurgitation and moderate tricuspid regurgitation. Right ventricle appears to be dilated. Her pulmonary pressures are at the upper limits of normal at 36 mmHg. Her Lasix dose was increased by Dr. Ladona Ridgel and she's feeling a bit better.  Septeber 17, 2013 - she has done well.  She has occasional episodes of CP when walking - goes away very quickly with a SL NTG.  These episodes of chest pain are very rare. She's able to do most of her normal activities without any significant problems. She has known stenosis in her ramus intermediate branch that was not amenable to PCI. We were not able to put a wire down that vessel. At that point she was started on Ranexa and has done well since that time.  June 27 ,2014: [name] is doing well.  She has occasional CP and takes NTG occasionally.  Feb. 9, 2015: [name] is doing ok.  She still has some exertional angina relieved with SL NTG.   She is able to do all of her normal daily activities without significant limitations as long as she takes the nitroglycerin.   She is on Ranexa 1000 BID.     Her BP is high this am - she has not taken her BP pills yet.  April 13, 2013:  [name] has been admitted to the hospital since I last saw her. She had a stress Myoview study which revealed a fixed apical defect and no ischemia.  Ejection fraction remained moderately reduced at 35%.  The isosorbide was increased to 120 mg a day.  She continues to have some mild chest pain but thinks that the episodes are decreased from before the hospitalization. She does use sublingual nitroglycerin on a daily basis that several times a day infact.   August 09, 2013:  She is doing well.  No significant angina.     Dec. 1, 2015:  [name] is a 82 y.o. female who I see for CAD, pacer, HTN, and mild chronic systolic CHF. Has some generalized fatigue. Has rare episodes of angina and use NTG spray.   Symptoms seem to be stable . She has requested a handicap sticker.   Aug. 4, 2016:  Still having some angina. Resolves with 1 SL NTG.  No syncope   Dec. 5, 2016  Having more angina .  Takes more NTG.  Relieves the pain .   Feb. 16, 2017:  Doing ok Still having to take NTG regularly  - several times a day .   Is on max dose Imdur  Her angina pattern is stable   June 01, 2015:  Doristine CounterBertha is seen for follow up Larey SeatFell down the steps  This past weekend Has a vertebral fracture ( compression Fx of L1)  Is now on Home O2.   August 31, 2015:  Ms. Yetta BarreJones is seen today for followup  Had a kyphoplasty this past week . Seems to be getting better  No dizziness, Is fatigue  Some CP , takes a SL NTG  Sept. 18, 2017  Doristine CounterBertha is seen today for work in visit for continued problems related to her CHF and CAD She has had increased swelling in her  Right  foot / leg. She has had increasing shortness of breath for the past several weeks and we have increased her Lasix to 40 mg  BID   Has had some wheezing for the past several days     Current Outpatient Prescriptions on File Prior to Visit  Medication Sig  Dispense Refill  . amLODipine (NORVASC) 5 MG tablet Take 0.5 tablets (2.5 mg total) by mouth daily. 90 tablet 3  . aspirin 81 MG tablet Take 1 tablet (81 mg total) by mouth every morning. 90 tablet 1  . atorvastatin (LIPITOR) 20 MG tablet Take 1 tablet (20 mg total) by mouth daily. 90 tablet 3  . calcium-vitamin D (OSCAL WITH D) 500-200 MG-UNIT per tablet Take 1 tablet by mouth daily.      . clopidogrel (PLAVIX) 75 MG tablet Take 1 tablet (75 mg total) by mouth daily. 90 tablet 3  . furosemide (LASIX) 40 MG tablet 2 TABS  EVERY AM  AND  1 TAB  EVERY PM 90 tablet 3  . isosorbide mononitrate (IMDUR) 120 MG 24 hr tablet Take 1 tablet (120 mg total) by mouth daily. 90 tablet 3  . levothyroxine (SYNTHROID, LEVOTHROID) 50 MCG tablet Take 1 tablet (50 mcg total) by mouth daily. 90 tablet 3  . meclizine (ANTIVERT) 12.5 MG tablet Take 1 tablet (12.5 mg total) by mouth as needed for dizziness (up to three times a day). 30 tablet 0  . metoprolol succinate (TOPROL-XL) 100 MG 24 hr tablet Take 1 tablet (100 mg total) by mouth daily. Take with or immediately following a meal. 90 tablet 3  . nitroGLYCERIN (NITROLINGUAL) 0.4 MG/SPRAY spray Place 1 spray under the tongue every 5 (five) minutes x 3 doses as needed for chest pain. 12 g 1  . ondansetron (ZOFRAN) 4 MG tablet Take 1 tablet (4 mg total) by mouth every 8 (eight) hours as needed for nausea or vomiting. 20 tablet 0  . oxyCODONE (OXY IR/ROXICODONE) 5 MG immediate release tablet Take 1 tablet (5 mg total) by mouth every 6 (six) hours as needed for moderate pain. 20 tablet 0  . potassium chloride SA (K-DUR,KLOR-CON) 20 MEQ tablet Take 1 tablet (20 mEq total) by mouth 2 (two) times daily. 180 tablet 3  . promethazine-codeine (PHENERGAN WITH CODEINE) 6.25-10 MG/5ML syrup Take 5 mLs by mouth every 4 (four) hours as needed for cough. 118 mL 0  . ranolazine (RANEXA) 1000 MG SR tablet Take 1 tablet (1,000 mg total) by mouth 2 (two) times daily. 180 tablet 3   No  current facility-administered medications on file prior to visit.     No Known Allergies  Past Medical History:  Diagnosis Date  . CAD (coronary artery disease) 1989   a. 1989: s/p CABGx3;  b. 06/2007 Cath/Attempted PCI: LM min irregs, LAD 90/100, D1 90 (attempted PCI), RI 90, LCX100p, OM1 100, RCA 60/70p, 90/768m, VG->RCA nl, VG->OM 100, LIMA->LAD  nl, EF 50%.  . Carotid artery occlusion 2011   60-79%right/ 40-59% left  . Chest pain March 2014  . Chronic cough    Seen by Dr. Sherene Sires  . Chronic systolic CHF (congestive heart failure) (HCC)    a. 03/2011 Echo: EF 40% inflat HK, Gr1 DD, mod MR/TR  . Complete heart block (HCC)    a. s/p PPM (MDT) by Dr Deborah Chalk 2004; b. Gen change 09/2010 (MDT ADDRL1 Adapta DC PPM Ser #:[id number]  . Diabetes mellitus   . GERD (gastroesophageal reflux disease)   . Hyperlipidemia   . Hypertension   . Hypothyroid     Past Surgical History:  Procedure Laterality Date  . CARDIAC CATHETERIZATION  2004, 2007, 2009  . CARDIAC CATHETERIZATION N/A 01/18/2015   Procedure: Left Heart Cath and Cors/Grafts Angiography;  Surgeon: Kathleene Hazel, MD;  Location: [facility] INVASIVE CV LAB;  Service: Cardiovascular;  Laterality: N/A;  . CATARACT EXTRACTION  2004   Both  . CORONARY ARTERY BYPASS GRAFT  1989  . PACEMAKER PLACEMENT  2004   MDT by Dr Deborah Chalk    History  Smoking Status  . Former Smoker  . Quit date: 12/15/1973  Smokeless Tobacco  . Never Used    History  Alcohol Use No    Family History  Problem Relation Age of Onset  . Hypertension Mother   . Colon cancer Brother   . Cancer Brother     colon  . Liver cancer Brother   . Breast cancer Daughter   . Heart attack Neg Hx     Reviw of Systems:  Reviewed in the HPI.  All other systems are negative.  Physical Exam: BP 126/74 (BP Location: Right Arm, Patient Position: Sitting, Cuff Size: Normal)   Pulse 98   Ht 5\' 3"  (1.6 m)   Wt 122 lb 6.4 oz (55.5 kg)   BMI 21.68 kg/m   O2 is 95% in RA    The patient is alert and oriented x 3.  The mood and affect are normal.   Skin: warm and dry.  Color is normal.    HEENT:   the sclera are nonicteric.  The mucous membranes are moist.  The carotids are 2+ with soft bruits. .  There is no thyromegaly.  Pulsatile next is inferior to her carotid arteries that are either do to a large V wave or perhaps do to her common carotid artery.  Lungs: clear.  The chest wall is non tender.    Heart: regular rate with a normal S1 and S2.  There is a soft systolic murmur The PMI is not displaced.  Her pacer is in the right subclavian.  Abdomen: good bowel sounds.  There is no guarding or rebound.  There is no hepatosplenomegaly or tenderness.  There are no masses.   Extremities:  no clubbing, cyanosis, or edema.  The legs are without rashes.  The distal pulses are diminished This am her right foot had mild swelling - it had resolved.   Neuro:  Cranial nerves II - XII are intact.  Motor and sensory functions are intact.    The gait is normal.  ECG:     Assessment / Plan:   1. CAD - s/p CABG 1989  -  She is having   Angina but it seems to be stable .  Has angina with everyday activities. On near maximal medication - Norvasc , metooprolol 100, Imdur 120 a day , ranexa 1000 BID She has known CAD with  a tight stenosis in the graft to RCA .  At her cath, there were no culprit lesions that could be revascularized  Will refill her NTG pump spray .   2. Pacer - for advanced AV block 3. PVD-  4. CHF - EF of 40% -  She's been on double dose Lasix for the past several weeks. She seems to be breathing better. She was seen today as a work in visit because of some right foot swelling. It turns out that she had some very minimal swelling on the dorsum of her right foot. I've reassured her that and her daughter Corrie Dandy this is a very benign condition. I've advised her to be aware if she develops significant leg edema that  goes up toward her knee.   Will draw a  basic  metabolic profile and a B natruretic peptide today as a follow-up to her increased Lasix dose. We will refill her nitroglycerin.  5. Diabetes Mellitus - managed by diet now.  6. Hypothyroidism 7. Bilateral carotid bruits:   Stable  8. Hyperlipidemia:  Stable , continue atorvastatin 20 ,   9. HTN - BP is  Well controlled.     Kristeen Miss, MD  10/29/2015 3:15 PM    [facility] [address] [place],  [address] [place], [place]  [address] Pager [phone] Phone: [phone]; Fax: [phone]

## 2015-10-29 NOTE — Patient Instructions (Addendum)
Medication Instructions:  USE ProAir (Albuterol) inhaler 2 puffs as needed every 6 hours as needed for wheezing   Labwork: TODAY - basic metabolic panel, BNP   Testing/Procedures: None Ordered   Follow-Up: Your physician recommends that you keep your follow-up appointment with Dr. Elease Hashimoto on Nov. 29 at 9:15 am   If you need a refill on your cardiac medications before your next appointment, please call your pharmacy.   Thank you for choosing [facility]! Eligha Bridegroom, RN [phone]

## 2015-10-29 NOTE — Telephone Encounter (Signed)
Pt c/o swelling: STAT is pt has developed SOB within 24 hours  1. How long have you been experiencing swelling? Weeks ago  2. Where is the swelling located? Rt foot 3.  Are you currently taking a "fluid pill"? yes 4.  Are you currently SOB? wheezing  5.  Have you traveled recently? no

## 2015-10-29 NOTE — Telephone Encounter (Signed)
Patient is scheduled to see Dr. Elease Hashimoto today at 2:45 pm

## 2015-10-30 ENCOUNTER — Ambulatory Visit (INDEPENDENT_AMBULATORY_CARE_PROVIDER_SITE_OTHER)
Admission: RE | Admit: 2015-10-30 | Discharge: 2015-10-30 | Disposition: A | Discharge status: Home / Self Care | Payer: Medicare Other | Source: Ambulatory Visit | Attending: Internal Medicine | Admitting: Internal Medicine

## 2015-10-30 ENCOUNTER — Encounter: Payer: Self-pay | Admitting: Internal Medicine

## 2015-10-30 ENCOUNTER — Ambulatory Visit (INDEPENDENT_AMBULATORY_CARE_PROVIDER_SITE_OTHER): Payer: Medicare Other | Admitting: Internal Medicine

## 2015-10-30 ENCOUNTER — Telehealth: Payer: Self-pay

## 2015-10-30 VITALS — BP 140/66 | HR 88 | Temp 98.2°F | Resp 20 | Ht 63.0 in | Wt 123.0 lb

## 2015-10-30 DIAGNOSIS — E11621 Type 2 diabetes mellitus with foot ulcer: Secondary | ICD-10-CM

## 2015-10-30 DIAGNOSIS — IMO0002 Reserved for concepts with insufficient information to code with codable children: Secondary | ICD-10-CM

## 2015-10-30 DIAGNOSIS — L84 Corns and callosities: Secondary | ICD-10-CM | POA: Diagnosis not present

## 2015-10-30 DIAGNOSIS — S32010S Wedge compression fracture of first lumbar vertebra, sequela: Secondary | ICD-10-CM | POA: Diagnosis not present

## 2015-10-30 DIAGNOSIS — T148 Other injury of unspecified body region: Secondary | ICD-10-CM | POA: Diagnosis not present

## 2015-10-30 DIAGNOSIS — I6523 Occlusion and stenosis of bilateral carotid arteries: Secondary | ICD-10-CM

## 2015-10-30 DIAGNOSIS — L97529 Non-pressure chronic ulcer of other part of left foot with unspecified severity: Secondary | ICD-10-CM

## 2015-10-30 LAB — BRAIN NATRIURETIC PEPTIDE: BRAIN NATRIURETIC PEPTIDE: 3105.8 pg/mL — AB (ref <100)

## 2015-10-30 LAB — BASIC METABOLIC PANEL
BUN: 23 mg/dL (ref 7–25)
CALCIUM: 9 mg/dL (ref 8.6–10.4)
CHLORIDE: 98 mmol/L (ref 98–110)
CO2: 27 mmol/L (ref 20–31)
Creat: 1.24 mg/dL — ABNORMAL HIGH (ref 0.60–0.88)
GLUCOSE: 100 mg/dL — AB (ref 65–99)
POTASSIUM: 4.6 mmol/L (ref 3.5–5.3)
SODIUM: 134 mmol/L — AB (ref 135–146)

## 2015-10-30 NOTE — Telephone Encounter (Signed)
Submitted for insurance verification to receive prolia injections----I will call patient to advise an estimated copay after I get summary explanation back---ok to give prolia injections per dr Okey Dupre

## 2015-10-30 NOTE — Patient Instructions (Signed)
We are checking the x-ray of the back today and will get you in for a second opinion about that.   We are going to get you in with the foot specialist as well.

## 2015-10-30 NOTE — Progress Notes (Signed)
Pre visit review using our clinic review tool, if applicable. No additional management support is needed unless otherwise documented below in the visit note. 

## 2015-10-30 NOTE — Progress Notes (Signed)
   Subjective:    Patient ID: Sydney Moore, female    DOB: 06-19-1923, 82 y.o.   MRN: [id number]  HPI The patient is a 82 YO female coming in for sore on her left heel. Started several weeks to a month or so ago. She hit the heel on some furniture and it bled then started to scab up. Previously she thinks the scab and spot was larger. Denies pain except to direct touch. No pain with walking, no swelling in the ankle, no redness in the area. It is not gone and since she has diabetes she wanted to come get it checked out.  She also is having a lot of problems with her back still and she wants to know if her back is okay. She had a surgery to correct compression fracture L1 this summer and it helped for awhile. Now she cannot sit or stand for >5 minutes without pain.   Review of Systems  Constitutional: Negative for activity change, appetite change, chills, fatigue, fever and unexpected weight change.  Respiratory: Negative for cough, chest tightness, shortness of breath and wheezing.   Cardiovascular: Negative for chest pain, palpitations and leg swelling.  Gastrointestinal: Negative for abdominal distention, abdominal pain, diarrhea, nausea and vomiting.  Musculoskeletal: Positive for back pain. Negative for arthralgias and gait problem.       Chronic  Skin: Positive for wound. Negative for color change and rash.  Neurological: Negative for dizziness, seizures, speech difficulty, weakness, numbness and headaches.      Objective:   Physical Exam  Constitutional: She is oriented to person, place, and time. She appears well-developed and well-nourished.  HENT:  Head: Normocephalic and atraumatic.  Eyes: EOM are normal.  Neck: Normal range of motion.  Cardiovascular: Normal rate and regular rhythm.   Pulmonary/Chest: Effort normal and breath sounds normal. No respiratory distress. She has no wheezes. She has no rales.  Abdominal: Soft. She exhibits no distension. There is no tenderness. There  is no rebound.  Musculoskeletal: She exhibits no edema.  Neurological: She is alert and oriented to person, place, and time. Coordination abnormal.  Using cane for ambulation  Skin: Skin is warm and dry.  1-2 mm wound on the back of the left heel with scab, no surrounding erythema or swelling, mild pain to direct palpation, no abscess.    Vitals:   10/30/15 1442  BP: 140/66  Pulse: 88  Resp: 20  Temp: 98.2 F (36.8 C)  TempSrc: Oral  SpO2: 91%  Weight: 123 lb (55.8 kg)  Height: 5\' 3"  (1.6 m)      Assessment & Plan:

## 2015-10-31 ENCOUNTER — Ambulatory Visit: Payer: Medicare Other | Admitting: Internal Medicine

## 2015-10-31 DIAGNOSIS — L97509 Non-pressure chronic ulcer of other part of unspecified foot with unspecified severity: Secondary | ICD-10-CM

## 2015-10-31 DIAGNOSIS — E11621 Type 2 diabetes mellitus with foot ulcer: Secondary | ICD-10-CM | POA: Insufficient documentation

## 2015-10-31 NOTE — Assessment & Plan Note (Signed)
Checking lumbar x-ray today for changes and referring to neurosurgeon for second opinion.

## 2015-10-31 NOTE — Assessment & Plan Note (Signed)
Started with mechanical injury and is not healing well. Her sugars are at goal. No signs of infection or swelling to suggest need for antibiotics. Given her age recommended to start taking multivitamin for better healing. Keep covered and do not remove scab as it is protective at this time. No tracking deep to suggest osteomyelitis.

## 2015-11-16 ENCOUNTER — Ambulatory Visit (INDEPENDENT_AMBULATORY_CARE_PROVIDER_SITE_OTHER): Payer: Medicare Other | Admitting: Podiatry

## 2015-11-16 ENCOUNTER — Encounter: Payer: Self-pay | Admitting: Podiatry

## 2015-11-16 VITALS — BP 131/74 | HR 92 | Resp 16

## 2015-11-16 DIAGNOSIS — M79671 Pain in right foot: Secondary | ICD-10-CM

## 2015-11-16 DIAGNOSIS — L84 Corns and callosities: Secondary | ICD-10-CM

## 2015-11-16 DIAGNOSIS — I6523 Occlusion and stenosis of bilateral carotid arteries: Secondary | ICD-10-CM | POA: Diagnosis not present

## 2015-11-16 DIAGNOSIS — E0843 Diabetes mellitus due to underlying condition with diabetic autonomic (poly)neuropathy: Secondary | ICD-10-CM | POA: Diagnosis not present

## 2015-11-16 DIAGNOSIS — M79609 Pain in unspecified limb: Secondary | ICD-10-CM

## 2015-11-16 DIAGNOSIS — L603 Nail dystrophy: Secondary | ICD-10-CM

## 2015-11-16 DIAGNOSIS — B351 Tinea unguium: Secondary | ICD-10-CM | POA: Diagnosis not present

## 2015-11-16 DIAGNOSIS — Q828 Other specified congenital malformations of skin: Secondary | ICD-10-CM | POA: Diagnosis not present

## 2015-11-16 DIAGNOSIS — L851 Acquired keratosis [keratoderma] palmaris et plantaris: Secondary | ICD-10-CM

## 2015-11-16 DIAGNOSIS — L608 Other nail disorders: Secondary | ICD-10-CM | POA: Diagnosis not present

## 2015-11-16 DIAGNOSIS — M79672 Pain in left foot: Secondary | ICD-10-CM

## 2015-11-16 NOTE — Progress Notes (Signed)
   Subjective:    Patient ID: Sydney Moore, female    DOB: 08-Nov-1923, 82 y.o.   MRN: [id number]  HPI    Review of Systems  Constitutional: Positive for activity change and diaphoresis.  HENT: Positive for hearing loss.   Respiratory: Positive for apnea, cough and shortness of breath.   Cardiovascular: Positive for chest pain and leg swelling.  Gastrointestinal: Positive for constipation and nausea.  Musculoskeletal: Positive for back pain.  Neurological: Positive for dizziness and light-headedness.  Hematological: Bruises/bleeds easily.  All other systems reviewed and are negative.      Objective:   Physical Exam        Assessment & Plan:

## 2015-11-16 NOTE — Patient Instructions (Signed)
Diabetes and Foot Care Diabetes may cause you to have problems because of poor blood supply (circulation) to your feet and legs. This may cause the skin on your feet to become thinner, break easier, and heal more slowly. Your skin may become dry, and the skin may peel and crack. You may also have nerve damage in your legs and feet causing decreased feeling in them. You may not notice minor injuries to your feet that could lead to infections or more serious problems. Taking care of your feet is one of the most important things you can do for yourself.  HOME CARE INSTRUCTIONS  Wear shoes at all times, even in the house. Do not go barefoot. Bare feet are easily injured.  Check your feet daily for blisters, cuts, and redness. If you cannot see the bottom of your feet, use a mirror or ask someone for help.  Wash your feet with warm water (do not use hot water) and mild soap. Then pat your feet and the areas between your toes until they are completely dry. Do not soak your feet as this can dry your skin.  Apply a moisturizing lotion or petroleum jelly (that does not contain alcohol and is unscented) to the skin on your feet and to dry, brittle toenails. Do not apply lotion between your toes.  Trim your toenails straight across. Do not dig under them or around the cuticle. File the edges of your nails with an emery board or nail file.  Do not cut corns or calluses or try to remove them with medicine.  Wear clean socks or stockings every day. Make sure they are not too tight. Do not wear knee-high stockings since they may decrease blood flow to your legs.  Wear shoes that fit properly and have enough cushioning. To break in new shoes, wear them for just a few hours a day. This prevents you from injuring your feet. Always look in your shoes before you put them on to be sure there are no objects inside.  Do not cross your legs. This may decrease the blood flow to your feet.  If you find a minor scrape,  cut, or break in the skin on your feet, keep it and the skin around it clean and dry. These areas may be cleansed with mild soap and water. Do not cleanse the area with peroxide, alcohol, or iodine.  When you remove an adhesive bandage, be sure not to damage the skin around it.  If you have a wound, look at it several times a day to make sure it is healing.  Do not use heating pads or hot water bottles. They may burn your skin. If you have lost feeling in your feet or legs, you may not know it is happening until it is too late.  Make sure your health care provider performs a complete foot exam at least annually or more often if you have foot problems. Report any cuts, sores, or bruises to your health care provider immediately. SEEK MEDICAL CARE IF:   You have an injury that is not healing.  You have cuts or breaks in the skin.  You have an ingrown nail.  You notice redness on your legs or feet.  You feel burning or tingling in your legs or feet.  You have pain or cramps in your legs and feet.  Your legs or feet are numb.  Your feet always feel cold. SEEK IMMEDIATE MEDICAL CARE IF:   There is increasing redness,   swelling, or pain in or around a wound.  There is a red line that goes up your leg.  Pus is coming from a wound.  You develop a fever or as directed by your health care provider.  You notice a bad smell coming from an ulcer or wound.   This information is not intended to replace advice given to you by your health care provider. Make sure you discuss any questions you have with your health care provider.   Document Released: 01/25/2000 Document Revised: 09/29/2012 Document Reviewed: 07/06/2012 Elsevier Interactive Patient Education 2016 [employer]  

## 2015-11-17 NOTE — Progress Notes (Signed)
Patient ID: Sydney Moore, female   DOB: Jun 15, 1923, 82 y.o.   MRN: [id number] SUBJECTIVE Patient with a history of diabetes mellitus presents to office today complaining of elongated, thickened nails. Pain while ambulating in shoes. Patient is unable to trim their own nails.  Patient also complains of painful callus to the right posterior heel.  No Known Allergies  OBJECTIVE General Patient is awake, alert, and oriented x 3 and in no acute distress. Derm hyperkeratotic painful lesion noted to the right posterior heel. Skin is dry and supple bilateral. Negative open lesions or macerations. Remaining integument unremarkable. Nails are tender, long, thickened and dystrophic with subungual debris, consistent with onychomycosis, 1-5 bilateral. No signs of infection noted. Vasc  DP and PT pedal pulses palpable bilaterally. Temperature gradient within normal limits.  Neuro Epicritic and protective threshold sensation diminished bilaterally.  Musculoskeletal Exam No symptomatic pedal deformities noted bilateral. Muscular strength within normal limits.  ASSESSMENT 1. Diabetes Mellitus w/ peripheral neuropathy 2. Onychomycosis of nail due to dermatophyte bilateral 3. Pain in foot bilateral 4. Hyperkeratotic callus lesion noted to the right posterior heel  PLAN OF CARE 1. Patient evaluated today. 2. Instructed to maintain good pedal hygiene and foot care. Stressed importance of controlling blood sugar.  3. Mechanical debridement of nails 1-5 bilaterally performed using a nail nipper. Filed with dremel without incident.  4. Excisional debridement of the hyperkeratotic lesion noted to the right posterior heel was performed using a chisel blade without incident. 5. Return to clinic in 3 mos.    Felecia Shelling, DPM

## 2015-11-19 ENCOUNTER — Ambulatory Visit (INDEPENDENT_AMBULATORY_CARE_PROVIDER_SITE_OTHER): Payer: Medicare Other | Admitting: Geriatric Medicine

## 2015-11-19 DIAGNOSIS — Z23 Encounter for immunization: Secondary | ICD-10-CM

## 2015-11-19 DIAGNOSIS — M810 Age-related osteoporosis without current pathological fracture: Secondary | ICD-10-CM | POA: Diagnosis not present

## 2015-11-19 MED ORDER — DENOSUMAB 60 MG/ML ~~LOC~~ SOLN: 60.0000 mg | Freq: Once | SUBCUTANEOUS | 0 refills | Status: DC

## 2015-11-19 MED ORDER — DENOSUMAB 60 MG/ML ~~LOC~~ SOLN: 60.0000 mg | SUBCUTANEOUS | Status: AC

## 2015-11-19 MED ORDER — DENOSUMAB 60 MG/ML ~~LOC~~ SOLN
60.0000 mg | Freq: Once | SUBCUTANEOUS | Status: AC
  Administered 2015-11-19: 60 mg via SUBCUTANEOUS

## 2015-11-25 ENCOUNTER — Observation Stay (HOSPITAL_COMMUNITY): Payer: Medicare Other

## 2015-11-25 ENCOUNTER — Emergency Department (HOSPITAL_COMMUNITY): Payer: Medicare Other

## 2015-11-25 ENCOUNTER — Encounter (HOSPITAL_COMMUNITY): Payer: Self-pay | Admitting: Emergency Medicine

## 2015-11-25 DIAGNOSIS — R0600 Dyspnea, unspecified: Secondary | ICD-10-CM | POA: Diagnosis present

## 2015-11-25 DIAGNOSIS — E871 Hypo-osmolality and hyponatremia: Secondary | ICD-10-CM | POA: Diagnosis present

## 2015-11-25 DIAGNOSIS — R531 Weakness: Secondary | ICD-10-CM | POA: Diagnosis not present

## 2015-11-25 DIAGNOSIS — E785 Hyperlipidemia, unspecified: Secondary | ICD-10-CM

## 2015-11-25 DIAGNOSIS — R110 Nausea: Secondary | ICD-10-CM

## 2015-11-25 DIAGNOSIS — J189 Pneumonia, unspecified organism: Secondary | ICD-10-CM | POA: Diagnosis present

## 2015-11-25 DIAGNOSIS — E039 Hypothyroidism, unspecified: Secondary | ICD-10-CM | POA: Diagnosis present

## 2015-11-25 DIAGNOSIS — R5383 Other fatigue: Secondary | ICD-10-CM

## 2015-11-25 DIAGNOSIS — N183 Chronic kidney disease, stage 3 (moderate): Secondary | ICD-10-CM | POA: Diagnosis present

## 2015-11-25 DIAGNOSIS — Z7902 Long term (current) use of antithrombotics/antiplatelets: Secondary | ICD-10-CM

## 2015-11-25 DIAGNOSIS — R0682 Tachypnea, not elsewhere classified: Secondary | ICD-10-CM

## 2015-11-25 DIAGNOSIS — Z79899 Other long term (current) drug therapy: Secondary | ICD-10-CM

## 2015-11-25 DIAGNOSIS — I219 Acute myocardial infarction, unspecified: Secondary | ICD-10-CM | POA: Diagnosis not present

## 2015-11-25 DIAGNOSIS — K219 Gastro-esophageal reflux disease without esophagitis: Secondary | ICD-10-CM | POA: Diagnosis present

## 2015-11-25 DIAGNOSIS — I739 Peripheral vascular disease, unspecified: Secondary | ICD-10-CM

## 2015-11-25 DIAGNOSIS — E11622 Type 2 diabetes mellitus with other skin ulcer: Secondary | ICD-10-CM | POA: Diagnosis present

## 2015-11-25 DIAGNOSIS — Z7982 Long term (current) use of aspirin: Secondary | ICD-10-CM

## 2015-11-25 DIAGNOSIS — E860 Dehydration: Secondary | ICD-10-CM | POA: Diagnosis present

## 2015-11-25 DIAGNOSIS — E119 Type 2 diabetes mellitus without complications: Secondary | ICD-10-CM

## 2015-11-25 DIAGNOSIS — I5022 Chronic systolic (congestive) heart failure: Secondary | ICD-10-CM | POA: Diagnosis present

## 2015-11-25 DIAGNOSIS — R0989 Other specified symptoms and signs involving the circulatory and respiratory systems: Secondary | ICD-10-CM

## 2015-11-25 DIAGNOSIS — Z87891 Personal history of nicotine dependence: Secondary | ICD-10-CM

## 2015-11-25 DIAGNOSIS — I779 Disorder of arteries and arterioles, unspecified: Secondary | ICD-10-CM | POA: Diagnosis present

## 2015-11-25 DIAGNOSIS — I2510 Atherosclerotic heart disease of native coronary artery without angina pectoris: Secondary | ICD-10-CM | POA: Diagnosis present

## 2015-11-25 DIAGNOSIS — E1122 Type 2 diabetes mellitus with diabetic chronic kidney disease: Secondary | ICD-10-CM | POA: Diagnosis present

## 2015-11-25 DIAGNOSIS — Z950 Presence of cardiac pacemaker: Secondary | ICD-10-CM

## 2015-11-25 DIAGNOSIS — Z66 Do not resuscitate: Secondary | ICD-10-CM | POA: Diagnosis present

## 2015-11-25 DIAGNOSIS — I130 Hypertensive heart and chronic kidney disease with heart failure and stage 1 through stage 4 chronic kidney disease, or unspecified chronic kidney disease: Secondary | ICD-10-CM | POA: Diagnosis not present

## 2015-11-25 DIAGNOSIS — Z8249 Family history of ischemic heart disease and other diseases of the circulatory system: Secondary | ICD-10-CM

## 2015-11-25 DIAGNOSIS — Z803 Family history of malignant neoplasm of breast: Secondary | ICD-10-CM

## 2015-11-25 DIAGNOSIS — Z951 Presence of aortocoronary bypass graft: Secondary | ICD-10-CM

## 2015-11-25 DIAGNOSIS — I429 Cardiomyopathy, unspecified: Secondary | ICD-10-CM | POA: Diagnosis present

## 2015-11-25 DIAGNOSIS — R0602 Shortness of breath: Secondary | ICD-10-CM

## 2015-11-25 DIAGNOSIS — I5042 Chronic combined systolic (congestive) and diastolic (congestive) heart failure: Secondary | ICD-10-CM | POA: Diagnosis present

## 2015-11-25 DIAGNOSIS — D649 Anemia, unspecified: Secondary | ICD-10-CM | POA: Diagnosis not present

## 2015-11-25 DIAGNOSIS — Z800 Family history of malignant neoplasm of digestive organs: Secondary | ICD-10-CM

## 2015-11-25 LAB — COMPREHENSIVE METABOLIC PANEL
ALK PHOS: 77 U/L (ref 38–126)
ALT: 28 U/L (ref 14–54)
ANION GAP: 9 (ref 5–15)
AST: 53 U/L — ABNORMAL HIGH (ref 15–41)
Albumin: 3.5 g/dL (ref 3.5–5.0)
BILIRUBIN TOTAL: 0.8 mg/dL (ref 0.3–1.2)
BUN: 18 mg/dL (ref 6–20)
CALCIUM: 8.6 mg/dL — AB (ref 8.9–10.3)
CO2: 25 mmol/L (ref 22–32)
CREATININE: 1.26 mg/dL — AB (ref 0.44–1.00)
Chloride: 96 mmol/L — ABNORMAL LOW (ref 101–111)
GFR, EST AFRICAN AMERICAN: 42 mL/min — AB (ref >60)
GFR, EST NON AFRICAN AMERICAN: 36 mL/min — AB (ref >60)
Glucose, Bld: 203 mg/dL — ABNORMAL HIGH (ref 65–99)
Potassium: 4.9 mmol/L (ref 3.5–5.1)
SODIUM: 130 mmol/L — AB (ref 135–145)
TOTAL PROTEIN: 6.8 g/dL (ref 6.5–8.1)

## 2015-11-25 LAB — URINALYSIS, ROUTINE W REFLEX MICROSCOPIC
Glucose, UA: NEGATIVE mg/dL
HGB URINE DIPSTICK: NEGATIVE
Ketones, ur: 15 mg/dL — AB
NITRITE: POSITIVE — AB
PROTEIN: NEGATIVE mg/dL
SPECIFIC GRAVITY, URINE: 1.018 (ref 1.005–1.030)
pH: 6 (ref 5.0–8.0)

## 2015-11-25 LAB — CBC WITH DIFFERENTIAL/PLATELET
BASOS ABS: 0 10*3/uL (ref 0.0–0.1)
BASOS PCT: 0 %
EOS ABS: 0 10*3/uL (ref 0.0–0.7)
Eosinophils Relative: 0 %
HEMATOCRIT: 36.4 % (ref 36.0–46.0)
HEMOGLOBIN: 12.3 g/dL (ref 12.0–15.0)
Lymphocytes Relative: 12 %
Lymphs Abs: 0.5 10*3/uL — ABNORMAL LOW (ref 0.7–4.0)
MCH: 32.2 pg (ref 26.0–34.0)
MCHC: 33.8 g/dL (ref 30.0–36.0)
MCV: 95.3 fL (ref 78.0–100.0)
MONOS PCT: 4 %
Monocytes Absolute: 0.2 10*3/uL (ref 0.1–1.0)
NEUTROS ABS: 3.6 10*3/uL (ref 1.7–7.7)
NEUTROS PCT: 84 %
Platelets: 170 10*3/uL (ref 150–400)
RBC: 3.82 MIL/uL — ABNORMAL LOW (ref 3.87–5.11)
RDW: 14.2 % (ref 11.5–15.5)
WBC: 4.3 10*3/uL (ref 4.0–10.5)

## 2015-11-25 LAB — URINE MICROSCOPIC-ADD ON

## 2015-11-25 LAB — I-STAT TROPONIN, ED: TROPONIN I, POC: 0.04 ng/mL (ref 0.00–0.08)

## 2015-11-25 LAB — TROPONIN I: TROPONIN I: 0.11 ng/mL — AB (ref <0.03)

## 2015-11-25 LAB — LIPASE, BLOOD: LIPASE: 21 U/L (ref 11–51)

## 2015-11-25 LAB — LACTIC ACID, PLASMA
LACTIC ACID, VENOUS: 2.7 mmol/L — AB (ref 0.5–1.9)
Lactic Acid, Venous: 2.1 mmol/L (ref 0.5–1.9)

## 2015-11-25 LAB — GLUCOSE, CAPILLARY: GLUCOSE-CAPILLARY: 116 mg/dL — AB (ref 65–99)

## 2015-11-25 MED ORDER — ONDANSETRON HCL 4 MG/2ML IJ SOLN: 4.0000 mg | Freq: Four times a day (QID) | INTRAMUSCULAR | Status: DC | PRN

## 2015-11-25 MED ORDER — ENOXAPARIN SODIUM 40 MG/0.4ML ~~LOC~~ SOLN
40.0000 mg | Freq: Every day | SUBCUTANEOUS | Status: DC
  Administered 2015-11-26: 40 mg via SUBCUTANEOUS
  Filled 2015-11-25: qty 0.4

## 2015-11-25 MED ORDER — NITROGLYCERIN 0.4 MG SL SUBL: 0.4000 mg | SUBLINGUAL_TABLET | SUBLINGUAL | Status: DC | PRN

## 2015-11-25 MED ORDER — ATORVASTATIN CALCIUM 20 MG PO TABS
20.0000 mg | ORAL_TABLET | Freq: Every day | ORAL | Status: DC
  Administered 2015-11-25 – 2015-11-26 (×2): 20 mg via ORAL
  Filled 2015-11-25 (×2): qty 1

## 2015-11-25 MED ORDER — DEXTROSE 5 % IV SOLN
1.0000 g | Freq: Once | INTRAVENOUS | Status: AC
  Administered 2015-11-25: 1 g via INTRAVENOUS
  Filled 2015-11-25: qty 10

## 2015-11-25 MED ORDER — SODIUM CHLORIDE 0.9 % IV SOLN: INTRAVENOUS | Status: AC

## 2015-11-25 MED ORDER — IPRATROPIUM-ALBUTEROL 0.5-2.5 (3) MG/3ML IN SOLN
3.0000 mL | Freq: Once | RESPIRATORY_TRACT | Status: AC
  Administered 2015-11-25: 3 mL via RESPIRATORY_TRACT
  Filled 2015-11-25: qty 3

## 2015-11-25 MED ORDER — ENOXAPARIN SODIUM 60 MG/0.6ML ~~LOC~~ SOLN: 1.0000 mg/kg | Freq: Two times a day (BID) | SUBCUTANEOUS | Status: DC

## 2015-11-25 MED ORDER — IPRATROPIUM-ALBUTEROL 0.5-2.5 (3) MG/3ML IN SOLN
3.0000 mL | Freq: Two times a day (BID) | RESPIRATORY_TRACT | Status: DC
  Administered 2015-11-26 (×2): 3 mL via RESPIRATORY_TRACT
  Filled 2015-11-25 (×2): qty 3

## 2015-11-25 MED ORDER — CLOPIDOGREL BISULFATE 75 MG PO TABS
75.0000 mg | ORAL_TABLET | Freq: Every day | ORAL | Status: DC
  Administered 2015-11-25 – 2015-11-26 (×2): 75 mg via ORAL
  Filled 2015-11-25 (×2): qty 1

## 2015-11-25 MED ORDER — SODIUM CHLORIDE 0.9 % IV SOLN
INTRAVENOUS | Status: DC
  Administered 2015-11-25: 23:00:00 via INTRAVENOUS

## 2015-11-25 MED ORDER — LEVOTHYROXINE SODIUM 50 MCG PO TABS
50.0000 ug | ORAL_TABLET | Freq: Every day | ORAL | Status: DC
  Administered 2015-11-26: 50 ug via ORAL
  Filled 2015-11-25: qty 1

## 2015-11-25 MED ORDER — AMLODIPINE BESYLATE 2.5 MG PO TABS
2.5000 mg | ORAL_TABLET | Freq: Every day | ORAL | Status: DC
Start: 2015-11-26 — End: 2015-11-26
  Administered 2015-11-26: 2.5 mg via ORAL
  Filled 2015-11-25: qty 1

## 2015-11-25 MED ORDER — POTASSIUM CHLORIDE CRYS ER 20 MEQ PO TBCR
20.0000 meq | EXTENDED_RELEASE_TABLET | Freq: Two times a day (BID) | ORAL | Status: DC
  Administered 2015-11-25 – 2015-11-26 (×2): 20 meq via ORAL
  Filled 2015-11-25 (×2): qty 1

## 2015-11-25 MED ORDER — METOPROLOL SUCCINATE ER 100 MG PO TB24
100.0000 mg | ORAL_TABLET | Freq: Every day | ORAL | Status: DC
  Administered 2015-11-26: 100 mg via ORAL
  Filled 2015-11-25: qty 1

## 2015-11-25 MED ORDER — MECLIZINE HCL 25 MG PO TABS: 12.5000 mg | ORAL_TABLET | ORAL | Status: DC | PRN

## 2015-11-25 MED ORDER — SODIUM CHLORIDE 0.9 % IV BOLUS (SEPSIS)
500.0000 mL | Freq: Once | INTRAVENOUS | Status: AC
  Administered 2015-11-25: 500 mL via INTRAVENOUS

## 2015-11-25 MED ORDER — RANOLAZINE ER 500 MG PO TB12
1000.0000 mg | ORAL_TABLET | Freq: Two times a day (BID) | ORAL | Status: DC
  Administered 2015-11-25 – 2015-11-26 (×2): 1000 mg via ORAL
  Filled 2015-11-25 (×2): qty 2

## 2015-11-25 MED ORDER — METOCLOPRAMIDE HCL 5 MG/ML IJ SOLN
10.0000 mg | Freq: Once | INTRAMUSCULAR | Status: AC
  Administered 2015-11-25: 10 mg via INTRAVENOUS
  Filled 2015-11-25: qty 2

## 2015-11-25 MED ORDER — ACETAMINOPHEN 650 MG RE SUPP: 650.0000 mg | Freq: Four times a day (QID) | RECTAL | Status: DC | PRN

## 2015-11-25 MED ORDER — IPRATROPIUM-ALBUTEROL 0.5-2.5 (3) MG/3ML IN SOLN
3.0000 mL | Freq: Four times a day (QID) | RESPIRATORY_TRACT | Status: DC
  Administered 2015-11-25: 3 mL via RESPIRATORY_TRACT
  Filled 2015-11-25: qty 3

## 2015-11-25 MED ORDER — INSULIN ASPART 100 UNIT/ML ~~LOC~~ SOLN
0.0000 [IU] | Freq: Three times a day (TID) | SUBCUTANEOUS | Status: DC
  Administered 2015-11-26: 3 [IU] via SUBCUTANEOUS
  Administered 2015-11-26: 2 [IU] via SUBCUTANEOUS

## 2015-11-25 MED ORDER — ACETAMINOPHEN 325 MG PO TABS
650.0000 mg | ORAL_TABLET | Freq: Four times a day (QID) | ORAL | Status: DC | PRN
Start: 2015-11-25 — End: 2015-11-27

## 2015-11-25 MED ORDER — ALBUTEROL SULFATE (2.5 MG/3ML) 0.083% IN NEBU
2.5000 mg | INHALATION_SOLUTION | RESPIRATORY_TRACT | Status: DC | PRN
  Administered 2015-11-25 – 2015-11-26 (×4): 2.5 mg via RESPIRATORY_TRACT
  Filled 2015-11-25 (×3): qty 3

## 2015-11-25 MED ORDER — METHYLPREDNISOLONE SODIUM SUCC 125 MG IJ SOLR
125.0000 mg | Freq: Once | INTRAMUSCULAR | Status: AC
  Administered 2015-11-25: 125 mg via INTRAVENOUS
  Filled 2015-11-25: qty 2

## 2015-11-25 MED ORDER — ISOSORBIDE MONONITRATE ER 60 MG PO TB24
60.0000 mg | ORAL_TABLET | Freq: Every day | ORAL | Status: DC
  Administered 2015-11-26: 60 mg via ORAL
  Filled 2015-11-25: qty 1

## 2015-11-25 MED ORDER — ONDANSETRON HCL 4 MG PO TABS: 4.0000 mg | ORAL_TABLET | Freq: Four times a day (QID) | ORAL | Status: DC | PRN

## 2015-11-25 NOTE — Progress Notes (Signed)
CRITICAL VALUE ALERT  Critical value received:  Troponin 0.11, Lactic acid 2.7  Date of notification:  12/04/2015  Time of notification:  2020  Critical value read back:Yes.    Nurse who received alert:  Merry Proud, RN  MD notified (1st page):  Tama Gander, NP  Time of first page:  2055  MD notified (2nd page):  Time of second page:  Responding MD:    Time MD responded:

## 2015-11-25 NOTE — ED Triage Notes (Signed)
Pt arrives from home via [facility] reporting nausea with fatigue, pt reports being cool and clammy.  Pt reports dizizness, denies CP, SOB, LOC.  EMS reports giving 8 mg zofran.

## 2015-11-25 NOTE — ED Provider Notes (Signed)
[facility]-EMERGENCY DEPT Provider Note   CSN: [id number] Arrival date & time: Nov 26, 2015  1251     History   Chief Complaint Chief Complaint  Patient presents with  . Nausea  . Fatigue    HPI Sydney Moore is a 82 y.o. female.  HPI   Patient with hx DM, CAD, AV block s/p pacemaker, PVD, CHF (EF 40%), hypothyroidism, carotid bruits bilaterally p/w 1 week of generalized fatigue, decreased appetite, lightheadedness, nausea, SOB.  Has been constipated, small hard bowel movement yesterday.  She is able to pass flatus.  Denies fevers, recent illness, URI symptoms, chest or abdominal pain, urinary symptoms, leg swelling.  She has had a diabetic ulcer on her right heel that is doing well.    PCP Hillard Danker [facility]) Cardiology Dr Elease Hashimoto   Past Medical History:  Diagnosis Date  . CAD (coronary artery disease) 1989   a. 1989: s/p CABGx3;  b. 06/2007 Cath/Attempted PCI: LM min irregs, LAD 90/100, D1 90 (attempted PCI), RI 90, LCX100p, OM1 100, RCA 60/70p, 90/22m, VG->RCA nl, VG->OM 100, LIMA->LAD nl, EF 50%.  . Carotid artery occlusion 2011   60-79%right/ 40-59% left  . Chest pain March 2014  . Chronic cough    Seen by Dr. Sherene Sires  . Chronic systolic CHF (congestive heart failure) (HCC)    a. 03/2011 Echo: EF 40% inflat HK, Gr1 DD, mod MR/TR  . Complete heart block (HCC)    a. s/p PPM (MDT) by Dr Deborah Chalk 2004; b. Gen change 09/2010 (MDT ADDRL1 Adapta DC PPM Ser #:[id number]  . Diabetes mellitus   . GERD (gastroesophageal reflux disease)   . Hyperlipidemia   . Hypertension   . Hypothyroid     Patient Active Problem List   Diagnosis Date Noted  . Dyspnea 11/14/2015  . Diabetic foot ulcer (HCC) 10/31/2015  . Osteoporosis with fracture 09/13/2015  . CAD (coronary artery disease) 06/01/2015  . Hypoxic 05/26/2015  . Compression fracture 05/26/2015  . IT band syndrome 04/30/2015  . Coronary artery disease involving native coronary artery of native heart with unstable angina pectoris  (HCC)   . Vertigo   . Anemia 04/11/2013  . GERD (gastroesophageal reflux disease)   . Pacemaker-[employer] 10/29/2011  . Chronic systolic CHF (congestive heart failure) (HCC) 03/18/2011  . Claudication (HCC) 10/25/2010  . Atrioventricular block, complete (HCC) 05/09/2010  . Carotid artery disease (HCC) 08/07/2009  . INSOMNIA 08/07/2009  . Diet-controlled diabetes mellitus (HCC) 03/24/2007  . Hypothyroidism 04/15/2006  . Hyperlipidemia 04/15/2006  . Hypertensive cardiovascular disease 04/15/2006  . Atherosclerosis of coronary artery bypass graft 04/15/2006    Past Surgical History:  Procedure Laterality Date  . CARDIAC CATHETERIZATION  2004, 2007, 2009  . CARDIAC CATHETERIZATION N/A 01/18/2015   Procedure: Left Heart Cath and Cors/Grafts Angiography;  Surgeon: Kathleene Hazel, MD;  [facility]: [facility] INVASIVE CV LAB;  Service: Cardiovascular;  Laterality: N/A;  . CATARACT EXTRACTION  2004   Both  . CORONARY ARTERY BYPASS GRAFT  1989  . PACEMAKER PLACEMENT  2004   MDT by Dr Neldon Mc History    No data available       Home Medications    Prior to Admission medications   Medication Sig Start Date End Date Taking? Authorizing Provider  albuterol (PROVENTIL HFA;VENTOLIN HFA) 108 (90 Base) MCG/ACT inhaler Inhale 2 puffs into the lungs every 6 (six) hours as needed for wheezing or shortness of breath. 10/29/15  Yes Vesta Mixer, MD  amLODipine (NORVASC) 5 MG tablet  Take 0.5 tablets (2.5 mg total) by mouth daily. 08/31/15  Yes Vesta Mixer, MD  aspirin 81 MG tablet Take 1 tablet (81 mg total) by mouth every morning. 08/08/13  Yes Newt Lukes, MD  atorvastatin (LIPITOR) 20 MG tablet Take 1 tablet (20 mg total) by mouth daily. 06/26/15  Yes Vesta Mixer, MD  calcium-vitamin D (OSCAL WITH D) 500-200 MG-UNIT per tablet Take 1 tablet by mouth daily.     Yes Historical Provider, MD  clopidogrel (PLAVIX) 75 MG tablet Take 1 tablet (75 mg total) by mouth daily. 06/26/15  Yes  Vesta Mixer, MD  diclofenac sodium (VOLTAREN) 1 % GEL Apply 2 g topically daily as needed (for pain).   Yes Historical Provider, MD  furosemide (LASIX) 40 MG tablet 2 TABS  EVERY AM  AND  1 TAB  EVERY PM Patient taking differently: Take 40-80 mg by mouth See admin instructions. Take 80 mg by mouth in the morning and take 40 mg by mouth in the evening. 10/05/15  Yes Janetta Hora, PA-C  isosorbide mononitrate (IMDUR) 120 MG 24 hr tablet Take 1 tablet (120 mg total) by mouth daily. Patient taking differently: Take 60 mg by mouth daily.  06/26/15  Yes Vesta Mixer, MD  levothyroxine (SYNTHROID, LEVOTHROID) 50 MCG tablet Take 1 tablet (50 mcg total) by mouth daily. 04/30/15  Yes Myrlene Broker, MD  metoprolol succinate (TOPROL-XL) 100 MG 24 hr tablet Take 1 tablet (100 mg total) by mouth daily. Take with or immediately following a meal. 06/26/15  Yes Vesta Mixer, MD  nitroGLYCERIN (NITROLINGUAL) 0.4 MG/SPRAY spray Place 1 spray under the tongue every 5 (five) minutes x 3 doses as needed for chest pain. 10/29/15  Yes Vesta Mixer, MD  ondansetron (ZOFRAN) 4 MG tablet Take 1 tablet (4 mg total) by mouth every 8 (eight) hours as needed for nausea or vomiting. 05/29/15  Yes Leroy Sea, MD  potassium chloride SA (K-DUR,KLOR-CON) 20 MEQ tablet Take 1 tablet (20 mEq total) by mouth 2 (two) times daily. 06/26/15  Yes Vesta Mixer, MD  ranolazine (RANEXA) 1000 MG SR tablet Take 1 tablet (1,000 mg total) by mouth 2 (two) times daily. 06/26/15  Yes Vesta Mixer, MD  meclizine (ANTIVERT) 12.5 MG tablet Take 1 tablet (12.5 mg total) by mouth as needed for dizziness (up to three times a day). 01/13/14   Newt Lukes, MD  oxyCODONE (OXY IR/ROXICODONE) 5 MG immediate release tablet Take 1 tablet (5 mg total) by mouth every 6 (six) hours as needed for moderate pain. Patient not taking: Reported on 11/24/2015 05/29/15   Leroy Sea, MD  promethazine-codeine [facility] WITH CODEINE)  6.25-10 MG/5ML syrup Take 5 mLs by mouth every 4 (four) hours as needed for cough. Patient not taking: Reported on 12/01/2015 04/30/15   Myrlene Broker, MD    Family History Family History  Problem Relation Age of Onset  . Hypertension Mother   . Colon cancer Brother   . Cancer Brother     colon  . Liver cancer Brother   . Breast cancer Daughter   . Heart attack Neg Hx     Social History Social History  Substance Use Topics  . Smoking status: Former Smoker    Quit date: 12/15/1973  . Smokeless tobacco: Never Used  . Alcohol use No     Allergies   Review of patient's allergies indicates no known allergies.   Review of Systems Review  of Systems  All other systems reviewed and are negative.    Physical Exam Updated Vital Signs BP 154/93   Pulse 87   Temp 97.9 F (36.6 C) (Oral)   Resp (!) 29   Ht 5\' 3"  (1.6 m)   Wt 55.3 kg   SpO2 99%   BMI 21.61 kg/m   Physical Exam  Constitutional: She appears well-developed and well-nourished. No distress.  HENT:  Head: Normocephalic and atraumatic.  Neck: Neck supple.  Cardiovascular: Normal rate and regular rhythm.   Pulmonary/Chest: Effort normal. Tachypnea noted. No respiratory distress. She has decreased breath sounds. She has no wheezes. She has no rhonchi.  ?bibasilar rales  Abdominal: Soft. She exhibits no distension. There is no tenderness. There is no rebound and no guarding.  Musculoskeletal: She exhibits no edema.  Neurological: She is alert.  Skin: She is not diaphoretic.  Right heel with healing wound, very small scab in place.  No erythema, edema, warmth, discharge, or tenderness   Nursing note and vitals reviewed.    ED Treatments / Results  Labs (all labs ordered are listed, but only abnormal results are displayed) Labs Reviewed  COMPREHENSIVE METABOLIC PANEL - Abnormal; Notable for the following:       Result Value   Sodium 130 (*)    Chloride 96 (*)    Glucose, Bld 203 (*)    Creatinine,  Ser 1.26 (*)    Calcium 8.6 (*)    AST 53 (*)    GFR calc non Af Amer 36 (*)    GFR calc Af Amer 42 (*)    All other components within normal limits  CBC WITH DIFFERENTIAL/PLATELET - Abnormal; Notable for the following:    RBC 3.82 (*)    Lymphs Abs 0.5 (*)    All other components within normal limits  LIPASE, BLOOD  URINALYSIS, ROUTINE W REFLEX MICROSCOPIC (NOT AT [facility])  Rosezena SensorI-STAT TROPOININ, ED    EKG  EKG Interpretation None       Radiology Dg Chest 2 View  Result Date: 03/14/2015 CLINICAL DATA:  Shortness of breath EXAM: CHEST  2 VIEW COMPARISON:  May 26, 2015 FINDINGS: A 2 lead pacemaker is again identified. The pacemaker generator obscures the right hemidiaphragm. No pneumothorax. The cardiomediastinal silhouette is stable with cardiomegaly. No focal infiltrates identified. IMPRESSION: No acute abnormalities identified. Electronically Signed   By: Gerome Samavid  Williams III M.D   On: 03/14/2015 14:14    Procedures Procedures (including critical care time)  Medications Ordered in ED Medications  ipratropium-albuterol (DUONEB) 0.5-2.5 (3) MG/3ML nebulizer solution 3 mL (not administered)  sodium chloride 0.9 % bolus 500 mL (0 mLs Intravenous Stopped 28-Nov-2015 1449)  metoCLOPramide (REGLAN) injection 10 mg (10 mg Intravenous Given 28-Nov-2015 1323)     Initial Impression / Assessment and Plan / ED Course  I have reviewed the triage vital signs and the nursing notes.  Pertinent labs & imaging results that were available during my care of the patient were reviewed by me and considered in my medical decision making (see chart for details).  Clinical Course   Afebrile nontoxic elderly patient with 1 week generalized weakness, decreased appetite, lightheadedness, nausea and vomiting x 2 days.  Labs approximately at baseline, with hyperglycemia.  CXR without acute abnormality.  UA appears dehydrated, no result at time of admission. EKG is paced.  Pt also seen and examined by Dr  Ranae PalmsYelverton who recommends admission for presumed pneumonia.  Will also likely benefit from serial troponins given cardiac history.  Admitted to Dr Melynda Ripple, [facility].  Final Clinical Impressions(s) / ED Diagnoses   Final diagnoses:  Shortness of breath  Fatigue, unspecified type  Nausea  Dehydration    New Prescriptions New Prescriptions   No medications on file     Trixie Dredge, PA-C 11-27-2015 1628    Loren Racer, MD 11/28/2015 1623

## 2015-11-25 NOTE — ED Notes (Signed)
PA at bedside.

## 2015-11-25 NOTE — H&P (Signed)
[facility] History and Physical  VANESS GAFFNEY ZOX:[id number] DOB: 12-06-1923 DOA: 12/21/2015  Referring physician:  PCP: Myrlene Broker, MD   Chief Complaint: "I just didn't feel good."  HPI: Sydney Moore is a 82 y.o. female with past mental history significant for coronary artery disease, CHF, hypertension and heart block presents to the emergency room with chief complaint of weakness and cough.  Patient her daughter patient has been feeling off for about week now. Patient has been afebrile without chills at home. Patient has had a productive cough with yellow sputum. Patient denies any sick contacts. Patient has had a recent pneumonia vaccination. Patient has not gotten a flu vaccine this year. Of note patient has been sweating profusely in bed at night for several days now.  ED course: Patient received a DuoNeb and albuterol treatment. She remained But stated that she felt better. She also received 500 mL bolus.   Review of Systems:  As per HPI otherwise 10 point review of systems negative.    Past Medical History:  Diagnosis Date  . CAD (coronary artery disease) 1989   a. 1989: s/p CABGx3;  b. 06/2007 Cath/Attempted PCI: LM min irregs, LAD 90/100, D1 90 (attempted PCI), RI 90, LCX100p, OM1 100, RCA 60/70p, 90/50m, VG->RCA nl, VG->OM 100, LIMA->LAD nl, EF 50%.  . Carotid artery occlusion 2011   60-79%right/ 40-59% left  . Chest pain March 2014  . Chronic cough    Seen by Dr. Sherene Sires  . Chronic systolic CHF (congestive heart failure) (HCC)    a. 03/2011 Echo: EF 40% inflat HK, Gr1 DD, mod MR/TR  . Complete heart block (HCC)    a. s/p PPM (MDT) by Dr Deborah Chalk 2004; b. Gen change 09/2010 (MDT ADDRL1 Adapta DC PPM Ser #:[id number]  . Diabetes mellitus   . GERD (gastroesophageal reflux disease)   . Hyperlipidemia   . Hypertension   . Hypothyroid    Past Surgical History:  Procedure Laterality Date  . CARDIAC CATHETERIZATION  2004, 2007, 2009  . CARDIAC  CATHETERIZATION N/A 01/18/2015   Procedure: Left Heart Cath and Cors/Grafts Angiography;  Surgeon: Kathleene Hazel, MD;  Location: [facility] INVASIVE CV LAB;  Service: Cardiovascular;  Laterality: N/A;  . CATARACT EXTRACTION  2004   Both  . CORONARY ARTERY BYPASS GRAFT  1989  . PACEMAKER PLACEMENT  2004   MDT by Dr Deborah Chalk   Social History:  reports that she quit smoking about 41 years ago. She has never used smokeless tobacco. She reports that she does not drink alcohol or use drugs.  No Known Allergies  Family History  Problem Relation Age of Onset  . Hypertension Mother   . Colon cancer Brother   . Cancer Brother     colon  . Liver cancer Brother   . Breast cancer Daughter   . Heart attack Neg Hx      Prior to Admission medications   Medication Sig Start Date End Date Taking? Authorizing Provider  albuterol (PROVENTIL HFA;VENTOLIN HFA) 108 (90 Base) MCG/ACT inhaler Inhale 2 puffs into the lungs every 6 (six) hours as needed for wheezing or shortness of breath. 10/29/15  Yes Vesta Mixer, MD  amLODipine (NORVASC) 5 MG tablet Take 0.5 tablets (2.5 mg total) by mouth daily. 08/31/15  Yes Vesta Mixer, MD  aspirin 81 MG tablet Take 1 tablet (81 mg total) by mouth every morning. 08/08/13  Yes Newt Lukes, MD  atorvastatin (LIPITOR) 20 MG tablet Take 1 tablet (20  mg total) by mouth daily. 06/26/15  Yes Vesta MixerPhilip J Nahser, MD  calcium-vitamin D (OSCAL WITH D) 500-200 MG-UNIT per tablet Take 1 tablet by mouth daily.     Yes Historical Provider, MD  clopidogrel (PLAVIX) 75 MG tablet Take 1 tablet (75 mg total) by mouth daily. 06/26/15  Yes Vesta MixerPhilip J Nahser, MD  diclofenac sodium (VOLTAREN) 1 % GEL Apply 2 g topically daily as needed (for pain).   Yes Historical Provider, MD  furosemide (LASIX) 40 MG tablet 2 TABS  EVERY AM  AND  1 TAB  EVERY PM Patient taking differently: Take 40-80 mg by mouth See admin instructions. Take 80 mg by mouth in the morning and take 40 mg by mouth in the  evening. 10/05/15  Yes Janetta HoraKathryn R Thompson, PA-C  isosorbide mononitrate (IMDUR) 120 MG 24 hr tablet Take 1 tablet (120 mg total) by mouth daily. Patient taking differently: Take 60 mg by mouth daily.  06/26/15  Yes Vesta MixerPhilip J Nahser, MD  levothyroxine (SYNTHROID, LEVOTHROID) 50 MCG tablet Take 1 tablet (50 mcg total) by mouth daily. 04/30/15  Yes Myrlene BrokerElizabeth A Crawford, MD  metoprolol succinate (TOPROL-XL) 100 MG 24 hr tablet Take 1 tablet (100 mg total) by mouth daily. Take with or immediately following a meal. 06/26/15  Yes Vesta MixerPhilip J Nahser, MD  nitroGLYCERIN (NITROLINGUAL) 0.4 MG/SPRAY spray Place 1 spray under the tongue every 5 (five) minutes x 3 doses as needed for chest pain. 10/29/15  Yes Vesta MixerPhilip J Nahser, MD  ondansetron (ZOFRAN) 4 MG tablet Take 1 tablet (4 mg total) by mouth every 8 (eight) hours as needed for nausea or vomiting. 05/29/15  Yes Leroy SeaPrashant K Singh, MD  potassium chloride SA (K-DUR,KLOR-CON) 20 MEQ tablet Take 1 tablet (20 mEq total) by mouth 2 (two) times daily. 06/26/15  Yes Vesta MixerPhilip J Nahser, MD  ranolazine (RANEXA) 1000 MG SR tablet Take 1 tablet (1,000 mg total) by mouth 2 (two) times daily. 06/26/15  Yes Vesta MixerPhilip J Nahser, MD  meclizine (ANTIVERT) 12.5 MG tablet Take 1 tablet (12.5 mg total) by mouth as needed for dizziness (up to three times a day). 01/13/14   Newt LukesValerie A Leschber, MD  oxyCODONE (OXY IR/ROXICODONE) 5 MG immediate release tablet Take 1 tablet (5 mg total) by mouth every 6 (six) hours as needed for moderate pain. Patient not taking: Reported on 12/09/2015 05/29/15   Leroy SeaPrashant K Singh, MD  promethazine-codeine [facility](PHENERGAN WITH CODEINE) 6.25-10 MG/5ML syrup Take 5 mLs by mouth every 4 (four) hours as needed for cough. Patient not taking: Reported on 12/04/2015 04/30/15   Myrlene BrokerElizabeth A Crawford, MD   Physical Exam: Vitals:   12/11/2015 1515 11/16/2015 1530 12/08/2015 1600 11/12/2015 1630  BP: 163/88 154/93 154/74 126/67  Pulse: 86 87 86 80  Resp: 24 (!) 29 23 23   Temp:      TempSrc:        SpO2: 100% 99% 91% 98%  Weight:      Height:        Wt Readings from Last 3 Encounters:  12/02/2015 55.3 kg (122 lb)  10/30/15 55.8 kg (123 lb)  10/29/15 55.5 kg (122 lb 6.4 oz)    General:  Appears calm and comfortable. Damp all over. Ill-appearing. Eyes:  PERRL, EOMI, normal lids, iris ENT:  grossly normal hearing, lips & tongue-dry lips and oral mucosa Neck:  no LAD, masses or thyromegaly Cardiovascular:  RRR, no m/r/g. No LE edema.  Respiratory:  No rales no wheezing, decreased air movement, tachypnea, no use of  essentially muscles Abdomen:  soft, ntnd Skin:  no rash or induration seen on limited exam Musculoskeletal:  grossly normal tone BUE/BLE Psychiatric:  grossly normal mood and affect, speech fluent and appropriate Neurologic:  CN 2-12 grossly intact, moves all extremities in coordinated fashion. alert and oriented 3           Labs on Admission:  Basic Metabolic Panel:  Recent Labs Lab November 28, 2015 1321  NA 130*  K 4.9  CL 96*  CO2 25  GLUCOSE 203*  BUN 18  CREATININE 1.26*  CALCIUM 8.6*   Liver Function Tests:  Recent Labs Lab 11-28-15 1321  AST 53*  ALT 28  ALKPHOS 77  BILITOT 0.8  PROT 6.8  ALBUMIN 3.5    Recent Labs Lab 11-28-15 1321  LIPASE 21   No results for input(s): AMMONIA in the last 168 hours. CBC:  Recent Labs Lab 11-28-15 1321  WBC 4.3  NEUTROABS 3.6  HGB 12.3  HCT 36.4  MCV 95.3  PLT 170   Cardiac Enzymes: No results for input(s): CKTOTAL, CKMB, CKMBINDEX, TROPONINI in the last 168 hours.  BNP (last 3 results)  Recent Labs  05/26/15 2014 10/05/15 1459 10/29/15 1543  BNP 665.9* 3,716.9* 3,105.8*    ProBNP (last 3 results) No results for input(s): PROBNP in the last 8760 hours.   Serum creatinine: 1.26 mg/dL High 16/10/96 0454 Estimated creatinine clearance: 24.5 mL/min  CBG: No results for input(s): GLUCAP in the last 168 hours.  Radiological Exams on Admission: Dg Chest 2 View  Result Date:  11-28-2015 CLINICAL DATA:  Shortness of breath EXAM: CHEST  2 VIEW COMPARISON:  May 26, 2015 FINDINGS: A 2 lead pacemaker is again identified. The pacemaker generator obscures the right hemidiaphragm. No pneumothorax. The cardiomediastinal silhouette is stable with cardiomegaly. No focal infiltrates identified. IMPRESSION: No acute abnormalities identified. Electronically Signed   By: Gerome Sam III M.D   On: 2015/11/28 14:14    EKG: Independently reviewed. Ventricular rate 88, which atrial ventricularly paced rhythm, no acute findings  Assessment/Plan Active Problems:   Dyspnea  Dyspnea Well's score for PE of 0 IS ordered Scheduled DuoNeb's When necessary albuterol Oxygen therapy Continuous pulse oximetry Chest x-ray in the morning, history high was suspicious for pneumonia, if no improvement and no pneumonia on chest x-ray tomorrow, considering chest CT Due to smoking history we'll try single dose of steroids IV   Hyponatremia, hyperosmolar 278 Likely due to insensible losses from being ill. Patient has been sweating She is mildly dehydrated on exam we'll give her IV fluids in addition to like her take by mouth. Serial sodium checks  CHF Holding lasix pt is mildly dehydrated Lungs without crackles on admission  Weak bones Got Prolia his past week  Coronary artery disease Ntg sl when necessary Cont plavix, imdur, toprol, kdur, ranexa, asa  Low thyroid Cont synthroid  Hypertension When necessary hydralazine 10 mg IV as needed for severe blood pressure Norvasc 2.5mg  qd  Hyperlipidemia Continue statin  Vertigo  prn meclizine  CKD Baseline creatinine around 1.2, creatinine 1.26 on admission Based on chart review this is likely new onset CKD and warrants follow-up as an outpatient  Code Status: DNR DVT Prophylaxis: Lovenox Family Communication: dgtr at bedside Disposition Plan: Pending Improvement    Haydee Salter, MD Family Medicine [facility] [email or web address] Password [email or web address]

## 2015-11-26 ENCOUNTER — Telehealth: Payer: Self-pay | Admitting: Cardiovascular Disease

## 2015-11-26 ENCOUNTER — Observation Stay (HOSPITAL_COMMUNITY): Payer: Medicare Other

## 2015-11-26 ENCOUNTER — Encounter (HOSPITAL_COMMUNITY): Payer: Self-pay | Admitting: Cardiology

## 2015-11-26 DIAGNOSIS — R0989 Other specified symptoms and signs involving the circulatory and respiratory systems: Secondary | ICD-10-CM | POA: Diagnosis not present

## 2015-11-26 DIAGNOSIS — I5022 Chronic systolic (congestive) heart failure: Secondary | ICD-10-CM | POA: Diagnosis not present

## 2015-11-26 DIAGNOSIS — E785 Hyperlipidemia, unspecified: Secondary | ICD-10-CM | POA: Diagnosis present

## 2015-11-26 DIAGNOSIS — E039 Hypothyroidism, unspecified: Secondary | ICD-10-CM | POA: Diagnosis present

## 2015-11-26 DIAGNOSIS — E7800 Pure hypercholesterolemia, unspecified: Secondary | ICD-10-CM

## 2015-11-26 DIAGNOSIS — Z7902 Long term (current) use of antithrombotics/antiplatelets: Secondary | ICD-10-CM | POA: Diagnosis not present

## 2015-11-26 DIAGNOSIS — J189 Pneumonia, unspecified organism: Secondary | ICD-10-CM | POA: Diagnosis present

## 2015-11-26 DIAGNOSIS — Z7982 Long term (current) use of aspirin: Secondary | ICD-10-CM | POA: Diagnosis not present

## 2015-11-26 DIAGNOSIS — E11622 Type 2 diabetes mellitus with other skin ulcer: Secondary | ICD-10-CM | POA: Diagnosis present

## 2015-11-26 DIAGNOSIS — E871 Hypo-osmolality and hyponatremia: Secondary | ICD-10-CM | POA: Diagnosis present

## 2015-11-26 DIAGNOSIS — R0600 Dyspnea, unspecified: Secondary | ICD-10-CM | POA: Diagnosis not present

## 2015-11-26 DIAGNOSIS — Z87891 Personal history of nicotine dependence: Secondary | ICD-10-CM | POA: Diagnosis not present

## 2015-11-26 DIAGNOSIS — R531 Weakness: Secondary | ICD-10-CM | POA: Diagnosis present

## 2015-11-26 DIAGNOSIS — Z79899 Other long term (current) drug therapy: Secondary | ICD-10-CM | POA: Diagnosis not present

## 2015-11-26 DIAGNOSIS — Z951 Presence of aortocoronary bypass graft: Secondary | ICD-10-CM | POA: Diagnosis not present

## 2015-11-26 DIAGNOSIS — E038 Other specified hypothyroidism: Secondary | ICD-10-CM | POA: Diagnosis not present

## 2015-11-26 DIAGNOSIS — Z66 Do not resuscitate: Secondary | ICD-10-CM | POA: Diagnosis present

## 2015-11-26 DIAGNOSIS — Z803 Family history of malignant neoplasm of breast: Secondary | ICD-10-CM | POA: Diagnosis not present

## 2015-11-26 DIAGNOSIS — Z8249 Family history of ischemic heart disease and other diseases of the circulatory system: Secondary | ICD-10-CM | POA: Diagnosis not present

## 2015-11-26 DIAGNOSIS — K219 Gastro-esophageal reflux disease without esophagitis: Secondary | ICD-10-CM | POA: Diagnosis present

## 2015-11-26 DIAGNOSIS — Z950 Presence of cardiac pacemaker: Secondary | ICD-10-CM | POA: Diagnosis not present

## 2015-11-26 DIAGNOSIS — I5042 Chronic combined systolic (congestive) and diastolic (congestive) heart failure: Secondary | ICD-10-CM | POA: Diagnosis present

## 2015-11-26 DIAGNOSIS — E860 Dehydration: Secondary | ICD-10-CM | POA: Diagnosis present

## 2015-11-26 DIAGNOSIS — I130 Hypertensive heart and chronic kidney disease with heart failure and stage 1 through stage 4 chronic kidney disease, or unspecified chronic kidney disease: Secondary | ICD-10-CM | POA: Diagnosis present

## 2015-11-26 DIAGNOSIS — I2510 Atherosclerotic heart disease of native coronary artery without angina pectoris: Secondary | ICD-10-CM | POA: Diagnosis present

## 2015-11-26 DIAGNOSIS — I429 Cardiomyopathy, unspecified: Secondary | ICD-10-CM | POA: Diagnosis present

## 2015-11-26 DIAGNOSIS — I219 Acute myocardial infarction, unspecified: Secondary | ICD-10-CM | POA: Diagnosis not present

## 2015-11-26 DIAGNOSIS — E1122 Type 2 diabetes mellitus with diabetic chronic kidney disease: Secondary | ICD-10-CM | POA: Diagnosis present

## 2015-11-26 DIAGNOSIS — N183 Chronic kidney disease, stage 3 (moderate): Secondary | ICD-10-CM | POA: Diagnosis present

## 2015-11-26 DIAGNOSIS — Z800 Family history of malignant neoplasm of digestive organs: Secondary | ICD-10-CM | POA: Diagnosis not present

## 2015-11-26 LAB — CBC
HEMATOCRIT: 37.1 % (ref 36.0–46.0)
HEMOGLOBIN: 12.7 g/dL (ref 12.0–15.0)
MCH: 32.2 pg (ref 26.0–34.0)
MCHC: 34.2 g/dL (ref 30.0–36.0)
MCV: 94.2 fL (ref 78.0–100.0)
Platelets: 143 10*3/uL — ABNORMAL LOW (ref 150–400)
RBC: 3.94 MIL/uL (ref 3.87–5.11)
RDW: 14.2 % (ref 11.5–15.5)
WBC: 3.7 10*3/uL — AB (ref 4.0–10.5)

## 2015-11-26 LAB — GLUCOSE, CAPILLARY
GLUCOSE-CAPILLARY: 129 mg/dL — AB (ref 65–99)
GLUCOSE-CAPILLARY: 161 mg/dL — AB (ref 65–99)
Glucose-Capillary: 150 mg/dL — ABNORMAL HIGH (ref 65–99)

## 2015-11-26 LAB — BLOOD GAS, ARTERIAL
Acid-base deficit: 12.7 mmol/L — ABNORMAL HIGH (ref 0.0–2.0)
Bicarbonate: 13.3 mmol/L — ABNORMAL LOW (ref 20.0–28.0)
Expiratory PAP: 5
FIO2: 1
INSPIRATORY PAP: 10
LHR: 12 {breaths}/min
O2 SAT: 97.8 %
PO2 ART: 145 mmHg — AB (ref 83.0–108.0)
Patient temperature: 98.6
pCO2 arterial: 32.6 mmHg (ref 32.0–48.0)
pH, Arterial: 7.234 — ABNORMAL LOW (ref 7.350–7.450)

## 2015-11-26 LAB — TROPONIN I
TROPONIN I: 0.11 ng/mL — AB (ref <0.03)
TROPONIN I: 0.11 ng/mL — AB (ref <0.03)

## 2015-11-26 LAB — BASIC METABOLIC PANEL
ANION GAP: 10 (ref 5–15)
BUN: 16 mg/dL (ref 6–20)
CALCIUM: 8.4 mg/dL — AB (ref 8.9–10.3)
CO2: 23 mmol/L (ref 22–32)
Chloride: 100 mmol/L — ABNORMAL LOW (ref 101–111)
Creatinine, Ser: 1.15 mg/dL — ABNORMAL HIGH (ref 0.44–1.00)
GFR, EST AFRICAN AMERICAN: 47 mL/min — AB (ref >60)
GFR, EST NON AFRICAN AMERICAN: 41 mL/min — AB (ref >60)
Glucose, Bld: 145 mg/dL — ABNORMAL HIGH (ref 65–99)
POTASSIUM: 5.2 mmol/L — AB (ref 3.5–5.1)
SODIUM: 133 mmol/L — AB (ref 135–145)

## 2015-11-26 LAB — LACTIC ACID, PLASMA: Lactic Acid, Venous: 7 mmol/L (ref 0.5–1.9)

## 2015-11-26 MED ORDER — DEXTROSE 5 % IV SOLN
500.0000 mg | INTRAVENOUS | Status: DC
Start: 2015-11-26 — End: 2015-11-27
  Administered 2015-11-26: 500 mg via INTRAVENOUS
  Filled 2015-11-26 (×3): qty 500

## 2015-11-26 MED ORDER — POTASSIUM CHLORIDE CRYS ER 20 MEQ PO TBCR: 20.0000 meq | EXTENDED_RELEASE_TABLET | Freq: Every day | ORAL | Status: DC

## 2015-11-26 MED ORDER — FUROSEMIDE 10 MG/ML IJ SOLN
20.0000 mg | Freq: Once | INTRAMUSCULAR | Status: AC
  Administered 2015-11-26: 20 mg via INTRAVENOUS
  Filled 2015-11-26: qty 2

## 2015-11-26 MED ORDER — MORPHINE SULFATE (PF) 2 MG/ML IV SOLN: 0.2500 mg | Freq: Once | INTRAVENOUS | Status: DC

## 2015-11-26 MED ORDER — FUROSEMIDE 10 MG/ML IJ SOLN
40.0000 mg | Freq: Once | INTRAMUSCULAR | Status: AC
  Administered 2015-11-26: 40 mg via INTRAVENOUS

## 2015-11-26 MED ORDER — METHYLPREDNISOLONE SODIUM SUCC 40 MG IJ SOLR
40.0000 mg | Freq: Two times a day (BID) | INTRAMUSCULAR | Status: DC
  Administered 2015-11-26: 40 mg via INTRAVENOUS
  Filled 2015-11-26: qty 1

## 2015-11-26 MED ORDER — FUROSEMIDE 10 MG/ML IJ SOLN
INTRAMUSCULAR | Status: AC
  Filled 2015-11-26: qty 4

## 2015-11-26 MED ORDER — DEXTROSE 5 % IV SOLN
1.0000 g | INTRAVENOUS | Status: DC
  Administered 2015-11-26: 1 g via INTRAVENOUS
  Filled 2015-11-26 (×3): qty 10

## 2015-11-26 MED ORDER — ENOXAPARIN SODIUM 30 MG/0.3ML ~~LOC~~ SOLN: 30.0000 mg | Freq: Every day | SUBCUTANEOUS | Status: DC

## 2015-11-27 LAB — URINE CULTURE: Culture: <10000 — AB

## 2015-11-27 NOTE — Progress Notes (Signed)
Patient's son, Lilymae Brunot ([phone]) stated he would call with the funeral home information once he returned home.  Number for patient placement given to Mr. Steinruck.

## 2015-11-28 NOTE — Discharge Summary (Signed)
Expiration Note  Sydney Moore  MR#: [id number]  DOB:[birth year]  Date of Admission: 12/02/2015 Date of Death: 12/03/2022 Attending Physician:Sydney Moore  Patient's PCP: Sydney Broker, MD      Present on Admission: . Dyspnea . Hypothyroidism . Hyperlipidemia . Carotid artery disease (HCC) . Chronic systolic CHF (congestive heart failure) (HCC) . GERD (gastroesophageal reflux disease) . Anemia -Pneumonia   Hospital Course: Patient admitted with dyspnea---- due to pneumonia/chf.  Placed on duonebs, steroids, abx, lasix.  Patient acutely worsened during the evening of 12-03-2022.  During re-eval was thought to be in flash pulmonary edema with increased wheezing, responsive to lasix and Bipap.  Mental status  improved once on Bipap and lasix given.  Family updated and acknowledge that since her fall earlier this year, she has been progressively getting weaker and sicker.  DNR confirmed.  Suspect patient infarcted already weak heart.  Asystole at 9 PM   Labs/Results: Results for orders placed or performed during the hospital encounter of 2015/12/02 (from the past 48 hour(s))  Blood gas, arterial     Status: Abnormal   Collection Time: 12/03/2015  7:00 PM  Result Value Ref Range   FIO2 1.00    Delivery systems BIPAP    LHR 12.0 resp/min   Inspiratory PAP 10.0    Expiratory PAP 5.0    pH, Arterial 7.234 (L) 7.350 - 7.450   pCO2 arterial 32.6 32.0 - 48.0 mmHg   pO2, Arterial 145 (H) 83.0 - 108.0 mmHg   Bicarbonate 13.3 (L) 20.0 - 28.0 mmol/L   Acid-base deficit 12.7 (H) 0.0 - 2.0 mmol/L   O2 Saturation 97.8 %   Patient temperature 98.6    Collection site LEFT RADIAL    Sample type ARTERIAL DRAW    Allens test (pass/fail) PASS PASS  Lactic acid, plasma     Status: Abnormal   Collection Time: 12/03/15  7:44 PM  Result Value Ref Range   Lactic Acid, Venous 7.0 (HH) 0.5 - 1.9 mmol/L    Comment: CRITICAL RESULT CALLED TO, READ BACK BY AND VERIFIED WITH: BUCKNER B,RN 12/03/2015  2051 WAYK     X-ray Chest Pa And Lateral  Result Date: 2015-12-03 CLINICAL DATA:  Shortness of breath. EXAM: CHEST  2 VIEW COMPARISON:  12/02/15. FINDINGS: Cardiac pacer in stable position. Prior CABG. Cardiomegaly with mild pulmonary venous congestion. Low lung volumes. Small right pleural effusion cannot be excluded. No pneumothorax. IMPRESSION: 1. Cardiac pacer stable position. Prior CABG. Cardiomegaly with mild pulmonary venous congestion. Small right pleural effusion cannot be excluded. 2.  Low lung volumes. Electronically Signed   By: Maisie Fus  Register   On: 12-03-2015 06:47   Dg Chest 2 View  Result Date: Dec 02, 2015 CLINICAL DATA:  Shortness of breath EXAM: CHEST  2 VIEW COMPARISON:  May 26, 2015 FINDINGS: A 2 lead pacemaker is again identified. The pacemaker generator obscures the right hemidiaphragm. No pneumothorax. The cardiomediastinal silhouette is stable with cardiomegaly. No focal infiltrates identified. IMPRESSION: No acute abnormalities identified. Electronically Signed   By: Gerome Sam III M.D   On: Dec 02, 2015 14:14   Dg Lumbar Spine Complete  Result Date: 10/30/2015 CLINICAL DATA:  LEFT low back pain for 3 months. Larey Seat in April, status post lumbar spine surgery July. EXAM: LUMBAR SPINE - COMPLETE 4+ VIEW COMPARISON:  Lumbar spine radiographs August 08, 2015 FINDINGS: Osteopenia. Old L1 vertebral plana, status post cement augmentation, cement extrusion posterior into paraspinal soft tissues. No progressed height loss. The remaining lumbar vertebral bodies are  intact. Maintenance of lumbar lordosis. Intervertebral disc heights preserved. No pars interarticularis defects. Severe calcific atherosclerosis of the aortoiliac vessels. Median sternotomy and pacemaker wires present. IMPRESSION: L1 vertebral plana, interval cement augmentation. No acute fracture deformity or malalignment; osteopenia decreases sensitivity for acute nondisplaced fractures. Electronically Signed   By:  Awilda Metroourtnay  Bloomer M.D.   On: 10/30/2015 16:13   Dg Chest Port 1 View  Result Date: 12/04/2015 CLINICAL DATA:  Abnormal lung sounds on physical examination. Hypertension. EXAM: PORTABLE CHEST 1 VIEW COMPARISON:  November 25, 2015 FINDINGS: There is patchy airspace consolidation in the left base. There is mild atelectasis in the right base. There is cardiomegaly with pulmonary venous hypertension. There is a small right pleural effusion. There is atherosclerotic calcification in the aorta. Pacemaker leads are attached to the right atrium and right ventricle. No adenopathy. No bone lesions. IMPRESSION: Evidence of pulmonary vascular congestion. Airspace consolidation in the left base is concerning for superimposed pneumonia. There is aortic atherosclerosis. Cardiac silhouette is stable compared to 1 day prior. Pacemaker leads are attached to right atrium and right ventricle. Electronically Signed   By: Bretta BangWilliam  Woodruff III M.D.   On: 11/30/2015 07:24    Signed: Joseph ArtJESSICA U Clarkson Rosselli, DO 11/28/2015, 4:54 PM

## 2015-11-30 LAB — CULTURE, BLOOD (ROUTINE X 2)
Culture: NO GROWTH
Culture: NO GROWTH

## 2015-12-04 ENCOUNTER — Ambulatory Visit: Payer: Medicare Other | Admitting: Cardiovascular Disease

## 2015-12-12 NOTE — Procedures (Signed)
Called to room by RN due to respiratory distress, on arrival pt on 100% NRB, increased WOB, MD called, orders for Bipap, pt placed on Biapap at this time, pt remains DNR, waiting for MD to come to bedside to speak to family

## 2015-12-12 NOTE — Consult Note (Signed)
Reason for Consult: chest pain with mild troponin bump  Referring Physician: Dr. Eliseo Squires   PCP:  Hoyt Koch, MD  Primary Cardiologist:Dr. Evalee Jefferson is an 82 y.o. female.    Chief Complaint:  Admitted with nausea and fatigue yesterday afternoon.   HPI:  43 yoF with hx CAD and CABG 1989, PPM for advanced AV block, PVD with lt carotid bruit, chronic systolic HF wth EF 44%, DM-2, hypothyroidism, chronic chest pain takes SL NTG at home.  In April + fall with vertebral fracture L1, on Home oxygen.  In Sept she had increased SOB and her lasix was increased to 40 mg BID.   Last LHCG, 01/2015  With severe native disease and 2 of 3 grafts patent.  Per Dr. Angelena Form "She has no focal targets for PCI although she does have diffuse CAD. The two grafts that were patent in 2009 are still open. The change from 2009 is that the intermediate branch is now occluded but it appears to fill from the patent vein graft to the RCA. I would continue medical management for now. "  Recent Echo 10/19/15 with EF 20-25%, moderate LVH,  G2DD, mild AR, mod. MR,  Mod. TR. PA pk pressure 40 mmHg.  Previous EF at 40%.  She presented to ER yesterday with not feeling well + productive cough, with yellow sputum.  + Night sweats.  Lactic acid elevated.   EKG AV pacing at 88 Troponin 0.04; 0.11; 0.11 U/a + nitrites  Blood cultures pending Second CXR with Cardiac pacer stable position. Prior CABG. Cardiomegaly with mild pulmonary venous congestion. Small right pleural effusion cannot be excluded. (She was given IV fluids in ER)  + 1223  Third CXR with Evidence of pulmonary vascular congestion. Airspace consolidation in the left base is concerning for superimposed pneumonia. There is aortic atherosclerosis. Cardiac silhouette is stable compared to 1 day prior. Pacemaker leads are attached to right atrium and right Ventricle. With drop in EF Dr. Acie Fredrickson felt lasix was best treatment.  Her amlodipine was  stopped and ARB added back.    Currently no chest pain, + SOB.  She is unsure with night sweats if this was due to fever.  Currently cool and clammy.  Not given antibiotics that I see.  She has no elevated WBC actually lower this AM.      Past Medical History:  Diagnosis Date  . CAD (coronary artery disease) 1989   a. 1989: s/p CABGx3;  b. 06/2007 Cath/Attempted PCI: LM min irregs, LAD 90/100, D1 90 (attempted PCI), RI 90, LCX100p, OM1 100, RCA 60/70p, 90/61m VG->RCA nl, VG->OM 100, LIMA->LAD nl, EF 50%.  . Carotid artery occlusion 2011   60-79%right/ 40-59% left  . Chest pain March 2014  . Chronic cough    Seen by Dr. WMelvyn Novas . Chronic systolic CHF (congestive heart failure) ([facility]    a. 03/2011 Echo: EF 40% inflat HK, Gr1 DD, mod MR/TR  . Complete heart block ([facility]    a. s/p PPM (MDT) by Dr TDoreatha Lew2004; b. Gen change 09/2010 (MDT A[facility]M Ser ##:[id number]  . Diabetes mellitus   . GERD (gastroesophageal reflux disease)   . Hyperlipidemia   . Hypertension   . Hypothyroid     Past Surgical History:  Procedure Laterality Date  . CARDIAC CATHETERIZATION  2004, 2007, 2009  . CARDIAC CATHETERIZATION N/A 01/18/2015   Procedure: Left Heart Cath and Cors/Grafts Angiography;  Surgeon:  Burnell Blanks, MD;  Location: [facility];  Service: Cardiovascular;  Laterality: N/A;  . CATARACT EXTRACTION  2004   Both  . CORONARY ARTERY BYPASS GRAFT  1989  . LUMBAR SPINE SURGERY    . PACEMAKER PLACEMENT  2004   MDT by Dr Doreatha Lew    Family History  Problem Relation Age of Onset  . Hypertension Mother   . Colon cancer Brother   . Cancer Brother     colon  . Liver cancer Brother   . Breast cancer Daughter   . Heart attack Neg Hx    Social History:  reports that she quit smoking about 41 years ago. She has never used smokeless tobacco. She reports that she does not drink alcohol or use drugs.  Allergies: No Known Allergies  OUTPATIENT MEDICATIONS: No current  facility-administered medications on file prior to encounter.    Current Outpatient Prescriptions on File Prior to Encounter  Medication Sig Dispense Refill  . albuterol (PROVENTIL HFA;VENTOLIN HFA) 108 (90 Base) MCG/ACT inhaler Inhale 2 puffs into the lungs every 6 (six) hours as needed for wheezing or shortness of breath. 1 Inhaler 2  . amLODipine (NORVASC) 5 MG tablet Take 0.5 tablets (2.5 mg total) by mouth daily. 90 tablet 3  . aspirin 81 MG tablet Take 1 tablet (81 mg total) by mouth every morning. 90 tablet 1  . atorvastatin (LIPITOR) 20 MG tablet Take 1 tablet (20 mg total) by mouth daily. 90 tablet 3  . calcium-vitamin D (OSCAL WITH D) 500-200 MG-UNIT per tablet Take 1 tablet by mouth daily.      . clopidogrel (PLAVIX) 75 MG tablet Take 1 tablet (75 mg total) by mouth daily. 90 tablet 3  . furosemide (LASIX) 40 MG tablet 2 TABS  EVERY AM  AND  1 TAB  EVERY PM (Patient taking differently: Take 40-80 mg by mouth See admin instructions. Take 80 mg by mouth in the morning and take 40 mg by mouth in the evening.) 90 tablet 3  . isosorbide mononitrate (IMDUR) 120 MG 24 hr tablet Take 1 tablet (120 mg total) by mouth daily. (Patient taking differently: Take 60 mg by mouth daily. ) 90 tablet 3  . levothyroxine (SYNTHROID, LEVOTHROID) 50 MCG tablet Take 1 tablet (50 mcg total) by mouth daily. 90 tablet 3  . metoprolol succinate (TOPROL-XL) 100 MG 24 hr tablet Take 1 tablet (100 mg total) by mouth daily. Take with or immediately following a meal. 90 tablet 3  . nitroGLYCERIN (NITROLINGUAL) 0.4 MG/SPRAY spray Place 1 spray under the tongue every 5 (five) minutes x 3 doses as needed for chest pain. 12 g 3  . ondansetron (ZOFRAN) 4 MG tablet Take 1 tablet (4 mg total) by mouth every 8 (eight) hours as needed for nausea or vomiting. 20 tablet 0  . potassium chloride SA (K-DUR,KLOR-CON) 20 MEQ tablet Take 1 tablet (20 mEq total) by mouth 2 (two) times daily. 180 tablet 3  . ranolazine (RANEXA) 1000 MG SR  tablet Take 1 tablet (1,000 mg total) by mouth 2 (two) times daily. 180 tablet 3  . meclizine (ANTIVERT) 12.5 MG tablet Take 1 tablet (12.5 mg total) by mouth as needed for dizziness (up to three times a day). 30 tablet 0   CURRENT MEDICATIONS: Scheduled Meds: . amLODipine  2.5 mg Oral Daily  . atorvastatin  20 mg Oral Daily  . clopidogrel  75 mg Oral Daily  . enoxaparin (LOVENOX) injection  40 mg Subcutaneous Daily  .  insulin aspart  0-15 Units Subcutaneous TID WC  . ipratropium-albuterol  3 mL Nebulization BID  . isosorbide mononitrate  60 mg Oral Daily  . levothyroxine  50 mcg Oral QAC breakfast  . metoprolol succinate  100 mg Oral Daily  . potassium chloride SA  20 mEq Oral BID  . ranolazine  1,000 mg Oral BID   Continuous Infusions: . sodium chloride 100 mL/hr at 11/23/2015 2314   PRN Meds:.acetaminophen **OR** acetaminophen, albuterol, meclizine, nitroGLYCERIN, ondansetron **OR** ondansetron (ZOFRAN) IV    Results for orders placed or performed during the hospital encounter of 12/09/2015 (from the past 48 hour(s))  Comprehensive metabolic panel     Status: Abnormal   Collection Time: 11/16/2015  1:21 PM  Result Value Ref Range   Sodium 130 (L) 135 - 145 mmol/L   Potassium 4.9 3.5 - 5.1 mmol/L   Chloride 96 (L) 101 - 111 mmol/L   CO2 25 22 - 32 mmol/L   Glucose, Bld 203 (H) 65 - 99 mg/dL   BUN 18 6 - 20 mg/dL   Creatinine, Ser 1.26 (H) 0.44 - 1.00 mg/dL   Calcium 8.6 (L) 8.9 - 10.3 mg/dL   Total Protein 6.8 6.5 - 8.1 g/dL   Albumin 3.5 3.5 - 5.0 g/dL   AST 53 (H) 15 - 41 U/L   ALT 28 14 - 54 U/L   Alkaline Phosphatase 77 38 - 126 U/L   Total Bilirubin 0.8 0.3 - 1.2 mg/dL   GFR calc non Af Amer 36 (L) >60 mL/min   GFR calc Af Amer 42 (L) >60 mL/min    Comment: (NOTE) The eGFR has been calculated using the CKD EPI equation. This calculation has not been validated in all clinical situations. eGFR's persistently <60 mL/min signify possible Chronic Kidney Disease.    Anion  gap 9 5 - 15  CBC with Differential     Status: Abnormal   Collection Time: 11/12/2015  1:21 PM  Result Value Ref Range   WBC 4.3 4.0 - 10.5 K/uL   RBC 3.82 (L) 3.87 - 5.11 MIL/uL   Hemoglobin 12.3 12.0 - 15.0 g/dL   HCT 36.4 36.0 - 46.0 %   MCV 95.3 78.0 - 100.0 fL   MCH 32.2 26.0 - 34.0 pg   MCHC 33.8 30.0 - 36.0 g/dL   RDW 14.2 11.5 - 15.5 %   Platelets 170 150 - 400 K/uL   Neutrophils Relative % 84 %   Neutro Abs 3.6 1.7 - 7.7 K/uL   Lymphocytes Relative 12 %   Lymphs Abs 0.5 (L) 0.7 - 4.0 K/uL   Monocytes Relative 4 %   Monocytes Absolute 0.2 0.1 - 1.0 K/uL   Eosinophils Relative 0 %   Eosinophils Absolute 0.0 0.0 - 0.7 K/uL   Basophils Relative 0 %   Basophils Absolute 0.0 0.0 - 0.1 K/uL  Lipase, blood     Status: None   Collection Time: 11/15/2015  1:21 PM  Result Value Ref Range   Lipase 21 11 - 51 U/L  I-stat troponin, ED     Status: None   Collection Time: 12/01/2015  1:35 PM  Result Value Ref Range   Troponin i, poc 0.04 0.00 - 0.08 ng/mL   Comment 3            Comment: Due to the release kinetics of cTnI, a negative result within the first hours of the onset of symptoms does not rule out myocardial infarction with certainty. If myocardial infarction  is still suspected, repeat the test at appropriate intervals.   Urinalysis, Routine w reflex microscopic     Status: Abnormal   Collection Time: 11/11/2015  4:00 PM  Result Value Ref Range   Color, Urine ORANGE (A) YELLOW    Comment: BIOCHEMICALS MAY BE AFFECTED BY COLOR   APPearance CLEAR CLEAR   Specific Gravity, Urine 1.018 1.005 - 1.030   pH 6.0 5.0 - 8.0   Glucose, UA NEGATIVE NEGATIVE mg/dL   Hgb urine dipstick NEGATIVE NEGATIVE   Bilirubin Urine SMALL (A) NEGATIVE   Ketones, ur 15 (A) NEGATIVE mg/dL   Protein, ur NEGATIVE NEGATIVE mg/dL   Nitrite POSITIVE (A) NEGATIVE   Leukocytes, UA MODERATE (A) NEGATIVE  Urine microscopic-add on     Status: Abnormal   Collection Time: 11/29/2015  4:00 PM  Result Value  Ref Range   Squamous Epithelial / LPF 0-5 (A) NONE SEEN   WBC, UA 6-30 0 - 5 WBC/hpf   RBC / HPF 0-5 0 - 5 RBC/hpf   Bacteria, UA RARE (A) NONE SEEN  Troponin I (q 6hr x 3)     Status: Abnormal   Collection Time: 11/17/2015  6:46 PM  Result Value Ref Range   Troponin I 0.11 (HH) <0.03 ng/mL    Comment: CRITICAL RESULT CALLED TO, READ BACK BY AND VERIFIED WITH: M.TOBIAS,RN 12/07/2015 _0  BY V.WILKINS   Lactic acid, plasma     Status: Abnormal   Collection Time: 11/17/2015  6:55 PM  Result Value Ref Range   Lactic Acid, Venous 2.7 (HH) 0.5 - 1.9 mmol/L    Comment: CRITICAL RESULT CALLED TO, READ BACK BY AND VERIFIED WITH: M.TOBIAS,RN 12/09/2015 _1  BY V.WILKINS   Lactic acid, plasma     Status: Abnormal   Collection Time: 11/16/2015  8:58 PM  Result Value Ref Range   Lactic Acid, Venous 2.1 (HH) 0.5 - 1.9 mmol/L    Comment: CRITICAL RESULT CALLED TO, READ BACK BY AND VERIFIED WITH: DUVALL D,RN 11/21/2015 2211 WAYK   Glucose, capillary     Status: Abnormal   Collection Time: 11/30/2015 10:01 PM  Result Value Ref Range   Glucose-Capillary 116 (H) 65 - 99 mg/dL  Troponin I (q 6hr x 3)     Status: Abnormal   Collection Time: 12-01-2015 12:13 AM  Result Value Ref Range   Troponin I 0.11 (HH) <0.03 ng/mL    Comment: CRITICAL VALUE NOTED.  VALUE IS CONSISTENT WITH PREVIOUSLY REPORTED AND CALLED VALUE.  Glucose, capillary     Status: Abnormal   Collection Time: Dec 01, 2015  7:28 AM  Result Value Ref Range   Glucose-Capillary 129 (H) 65 - 99 mg/dL   X-ray Chest Pa And Lateral  Result Date: Dec 01, 2015 CLINICAL DATA:  Shortness of breath. EXAM: CHEST  2 VIEW COMPARISON:  12/10/2015. FINDINGS: Cardiac pacer in stable position. Prior CABG. Cardiomegaly with mild pulmonary venous congestion. Low lung volumes. Small right pleural effusion cannot be excluded. No pneumothorax. IMPRESSION: 1. Cardiac pacer stable position. Prior CABG. Cardiomegaly with mild pulmonary venous congestion. Small right pleural  effusion cannot be excluded. 2.  Low lung volumes. Electronically Signed   By: Marcello Moores  Register   On: 01-Dec-2015 06:47   Dg Chest 2 View  Result Date: 11/21/2015 CLINICAL DATA:  Shortness of breath EXAM: CHEST  2 VIEW COMPARISON:  May 26, 2015 FINDINGS: A 2 lead pacemaker is again identified. The pacemaker generator obscures the right hemidiaphragm. No pneumothorax. The cardiomediastinal silhouette is stable with cardiomegaly. No focal infiltrates  identified. IMPRESSION: No acute abnormalities identified. Electronically Signed   By: Dorise Bullion III M.D   On: 11/30/2015 14:14   Dg Chest Port 1 View  Result Date: 12-15-15 CLINICAL DATA:  Abnormal lung sounds on physical examination. Hypertension. EXAM: PORTABLE CHEST 1 VIEW COMPARISON:  November 25, 2015 FINDINGS: There is patchy airspace consolidation in the left base. There is mild atelectasis in the right base. There is cardiomegaly with pulmonary venous hypertension. There is a small right pleural effusion. There is atherosclerotic calcification in the aorta. Pacemaker leads are attached to the right atrium and right ventricle. No adenopathy. No bone lesions. IMPRESSION: Evidence of pulmonary vascular congestion. Airspace consolidation in the left base is concerning for superimposed pneumonia. There is aortic atherosclerosis. Cardiac silhouette is stable compared to 1 day prior. Pacemaker leads are attached to right atrium and right ventricle. Electronically Signed   By: Lowella Grip III M.D.   On: 12-15-15 07:24    ECHO 10/2015  Study Conclusions  - Left ventricle: The cavity size was normal. Wall thickness was   increased in a pattern of moderate LVH. There was focal basal   hypertrophy. Systolic function was severely reduced. The   estimated ejection fraction was in the range of 20% to 25%.   Akinesis of the mid-apicalanteroseptal, inferior, and apical   myocardium. Features are consistent with a pseudonormal left    ventricular filling pattern, with concomitant abnormal relaxation   and increased filling pressure (grade 2 diastolic dysfunction).   Doppler parameters are consistent with high ventricular filling   pressure. - Aortic valve: Mildly calcified annulus. There was mild   regurgitation. - Mitral valve: There was moderate regurgitation. - Left atrium: The atrium was severely dilated. - Right ventricle: The cavity size was mildly dilated. Systolic   function was mildly to moderately reduced. - Right atrium: The atrium was moderately dilated. - Tricuspid valve: There was moderate regurgitation. - Pulmonary arteries: Systolic pressure was moderately increased.   PA peak pressure: 40 mm Hg (S).  CATH 01/28/15  Left Heart Cath and Cors/Grafts Angiography  Conclusion    Dist RCA lesion, 100% stenosed.  Ost RCA to Mid RCA lesion, 40% stenosed.  SVG was injected is normal in caliber, and is anatomically normal.  Ost Ramus to Ramus lesion, 100% stenosed.  Prox Cx lesion, 50% stenosed.  Mid Cx lesion, 100% stenosed.  Origin lesion, before Post Atrio, 30% stenosed.  Ost LAD to Prox LAD lesion, 100% stenosed.  SVG was injected is normal in caliber.  There is severe disease in the graft.  Occluded at the ostium. Chronic.  Origin lesion, 100% stenosed.  LIMA was injected is normal in caliber, and is anatomically normal.  Origin lesion, before Dist LAD, 40% stenosed.   1. Severe triple vessel CAD s/p 3 V CABG with 2/3 patent grafts.  2. The RCA has diffuse proximal and mid 50% stenosis followed by total occlusion in the mid vessel. The distal vessel including the PDA and PLA fill by the patent vein graft. There are collaterals from the distal RCA branches that fill the distal Circumflex and intermediate branch.  3. The Circumflex has a 50% stenosis in the proximal segment followed by a small marginal branch and then total occlusion. The distal Circumflex fills from right to left  collaterals  (through the vein graft to RCA) 4. The intermediate branch is occluded at the ostium. This vessel fills from right to left collaterals (through the vein graft to RCA) 5. The LAD  is occluded proximally. The entire vessel fills from the patent LIMA graft.  6. LIMA to mid LAD is patent. The ostium of the LIMA graft has a 40% stenosis  7. SVG to RCA is patent with 40% proximal body of graft stenosis 8. SVG to OM is occluded (known, chronic)  Recommendations: She has no focal targets for PCI although she does have diffuse CAD. The two grafts that were patent in 2009 are still open. The change from 2009 is that the intermediate branch is now occluded but it appears to fill from the patent vein graft to the RCA. I would continue medical management for now.       ROS: General:no colds or fevers, no weight changes Skin:no rashes or ulcers HEENT:no blurred vision, no congestion CV:see HPI PUL:see HPI GI:no diarrhea constipation or melena, no indigestion GU:no hematuria, no dysuria MS:no joint pain, no claudication Neuro:no syncope, no lightheadedness Endo:+ diabetes glucose was stable with night sweats, + thyroid disease   Blood pressure (!) 163/91, pulse 84, temperature 97.5 F (36.4 C), temperature source Oral, resp. rate 16, height _0  (1.6 m), weight 125 lb 12.8 oz (57.1 kg), SpO2 99 %.  Wt Readings from Last 3 Encounters:  Dec 13, 2015 125 lb 12.8 oz (57.1 kg)  10/30/15 123 lb (55.8 kg)  10/29/15 122 lb 6.4 oz (55.5 kg)    PE: General:Pleasant affect, NAD, feels better this AM Skin:Warm and dry, brisk capillary refill HEENT:normocephalic, sclera clear, mucus membranes moist Neck:supple, no JVD, no bruits  Heart:S1S2 RRR with 3/6 systolic murmur, no gallup, rub or click Lungs: with rales in bases and diminished breath sounds. occ rhonchi, no wheezes FOY:DXAJ, non tender, + BS, do not palpate liver spleen or masses Ext:no lower ext edema, 2+ pedal pulses, 2+ radial  pulses, Rt heel ulcer has healed Neuro:alert and oriented X 3, MAE, follows commands, + facial symmetry Tele: AV pacing and sensing  Assessment/Plan Principal Problem:   Dyspnea Active Problems:   Hypothyroidism   Diet-controlled diabetes mellitus (HCC)   Hyperlipidemia   Carotid artery disease (HCC)   Chronic systolic CHF (congestive heart failure) (HCC)   GERD (gastroesophageal reflux disease)   Anemia  CAP most likely Pt rec'd IV Rocephin and steroids in ER, Nebulizers were given.  Nebvs repeated this AM with acute SOB and wheezes.  Poss need to give a dose of IV lasix now with CXR this AM.   Elevated troponin most likely due to demand ischemia -troponins flat, with hx. chronic angina treated with SL NTG.  Last cath with medical therapy - continue imdur, BB, ranexa and amlodipine. Also asa and plavix.  Would not add IV heparin unless acute chest pain.  Cardiomyopathy systolic and diastolic combination- now with volume overload with IV fluids for CAP  HTN elevated this AM resume lasix  CKD 3- stable  Hyponatremia- 130 in ER 133 this AM   Chronic systolic and diastolic HF   CAD with hx CABG and 2/3 grafts patent 01/2015, with chronic angina, treating medically.   DM-2 per IM     Cecilie Kicks  Nurse Practitioner Certified [place] Pager [phone] or after 5pm or weekends call [phone] Dec 13, 2015, 8:41 AM

## 2015-12-12 NOTE — Care Management Note (Signed)
Case Management Note  Patient Details  Name: Sydney Moore MRN: [id number] Date of Birth: 02/13/1923  Subjective/Objective:   Pt presented for SOB. Pt is from home with daughter Sydney Moore. Plan will be to return home once stable. Pt will benefit from PT/OT consult before d/c home. Per daughter pt has DME RW and BSC. Daughter states that patient was moving slowly in the home due to her respiratory status. Daughter takes patient to all of her MD appointments.                 Action/Plan: CM will continue to monitor for additional needs.   Expected Discharge Date:                  Expected Discharge Plan:  Home w Home Health Services  In-House Referral:     Discharge planning Services  CM Consult  Post Acute Care Choice:    Choice offered to:     DME Arranged:    DME Agency:     HH Arranged:    HH Agency:     Status of Service:  In process, will continue to follow  If discussed at Long Length of Stay Meetings, dates discussed:    Additional Comments:  Gala Lewandowsky, RN 11/25/2015, 11:30 AM

## 2015-12-12 NOTE — Progress Notes (Signed)
Pt SOB with audible wheezing and tachypnea.  RT notified and neb given.  Merdis Delay, NP notified and portable CXR ordered.  Will continue to monitor.  Alonza Bogus

## 2015-12-12 NOTE — Telephone Encounter (Signed)
New message  Pt daughter call requesting to speak with RN to inform pt has been hospuitalized for SOB and wheezing. Please call back to discuss if needed

## 2015-12-12 NOTE — Progress Notes (Signed)
Event note; at 2040 RN and tech repositioning patient. Patient became less responsive during respositioning then unarousable and appeared to not be breathing.  Shortly there after monitor asystole.  No respirations noted, unable to palpate femoral pulse.  Patient's daughter Corrie Dandy present at bedside.

## 2015-12-12 NOTE — Progress Notes (Signed)
Pt having increased wheezing this afternoon, a breathing treatment was administered. Respiratons 18/20. Rn will continue to monitor. Cecille Rubin, RN

## 2015-12-12 NOTE — Care Management Obs Status (Signed)
MEDICARE OBSERVATION STATUS NOTIFICATION   Patient Details  Name: Sydney Moore MRN: [id number] Date of Birth: 28-Oct-1923   Medicare Observation Status Notification Given:  Yes    Gala Lewandowsky, RN 11/21/2015, 11:30 AM

## 2015-12-12 NOTE — Telephone Encounter (Signed)
Spoke with patient's daughter, Corrie Dandy, to get an update on patient's condition.  She thanked me for the call.

## 2015-12-12 NOTE — Progress Notes (Addendum)
Came back to evaluate patient as has increased work of breathing and decreased level of consciousness.  Placed on Bipap.  Given 40 mg lasix x 1.  ABG ordered.  Foley place.  Daughter updated.  They acknowledge that since her fall earlier this year, she has been progressively getting weaker and sicker.  Daughter knows EF 20% and medical therapy is recommended.  Plan is to continue therapy as long as patient remains stable.  If she worsens, then palliative measure need to be taken to make sure patient is comfortable and DNR status is respected. BP: 81/51 HR 61  Sydney Mannis DO

## 2015-12-12 NOTE — Progress Notes (Signed)
Pt  With increased SOB after walking to bathroom with family. RN instructed pt and family only RN staff to get pt up to the BR, and we use BSC. Breathing tx given at this time. Will continue to monitor. Cecille Rubin, RN

## 2015-12-12 NOTE — Progress Notes (Signed)
PROGRESS NOTE    Sydney SPIEWAK  Moore:[id number] DOB: 1924-01-23 DOA: 11/19/2015 PCP: Myrlene Broker, MD   Outpatient Specialists:     Brief Narrative:  Sydney Moore is a 82 y.o. female with past mental history significant for coronary artery disease, CHF, hypertension and heart block presents to the emergency room with chief complaint of weakness and cough.  Patient her daughter patient has been feeling off for about week now. Patient has been afebrile without chills at home. Patient has had a productive cough with yellow sputum. Patient denies any sick contacts. Patient has had a recent pneumonia vaccination. Patient has not gotten a flu vaccine this year. Of note patient has been sweating profusely in bed at night for several days now.    Assessment & Plan:   Principal Problem:   Dyspnea Active Problems:   Hypothyroidism   Diet-controlled diabetes mellitus (HCC)   Hyperlipidemia   Carotid artery disease (HCC)   Chronic systolic CHF (congestive heart failure) (HCC)   GERD (gastroesophageal reflux disease)   Anemia   Dyspnea- suspect from pulm edema + pnuemonia Well's score for PE of 0 Scheduled DuoNeb's When necessary albuterol Oxygen therapy -steroids -IV lasix -IV abx  Coronary artery disease Ntg sl when necessary Cont plavix, imdur, toprol, kdur, ranexa, asa  Low thyroid Cont synthroid  Hypertension When necessary hydralazine 10 mg IV as needed for severe blood pressure Norvasc 2.5mg  qd  Hyperlipidemia Continue statin  Vertigo  prn meclizine  CKD Baseline creatinine around 1.2 -trend  PT/OT eval  -suspect patient meets inpatient criteria as doubt patient will be discharged in AM   DVT prophylaxis:  Lovenox   Code Status: DNR   Family Communication: No family at bedside  Disposition Plan:  PT/OT eval (lives with daughter)   Consultants:  cards  Subjective: Trying to eat but very SOB  Objective: Vitals:   12/05/2015 2035 December 13, 2015 0537 2015-12-13 0550 13-Dec-2015 0849  BP: (!) 164/82 (!) 163/91    Pulse: 88 84    Resp: 16     Temp: 97.5 F (36.4 C)     TempSrc: Oral     SpO2: 99% 100% 99% 95%  Weight: 56.6 kg (124 lb 11.2 oz) 57.1 kg (125 lb 12.8 oz)    Height: 5\' 3"  (1.6 m)       Intake/Output Summary (Last 24 hours) at 12-13-2015 1431 Last data filed at 12-13-2015 0606  Gross per 24 hour  Intake          1223.33 ml  Output              300 ml  Net           923.33 ml   Filed Weights   11/29/2015 1306 12/04/2015 2035 Dec 13, 2015 0537  Weight: 55.3 kg (122 lb) 56.6 kg (124 lb 11.2 oz) 57.1 kg (125 lb 12.8 oz)    Examination:  General exam: Appears calm and comfortable  Respiratory system: Clear to auscultation. Respiratory effort normal. Cardiovascular system: S1 & S2 heard, RRR. No JVD, murmurs, rubs, gallops or clicks. No pedal edema. Gastrointestinal system: Abdomen is nondistended, soft and nontender. No organomegaly or masses felt. Normal bowel sounds heard. Central nervous system: Alert and oriented. No focal neurological deficits. Extremities: Symmetric 5 x 5 power. Skin: No rashes, lesions or ulcers Psychiatry: Judgement and insight appear normal. Mood & affect appropriate.     Data Reviewed: I have personally reviewed following labs and imaging studies  CBC:  Recent Labs  Lab 12/09/15 1321 11/27/2015 0835  WBC 4.3 3.7*  NEUTROABS 3.6  --   HGB 12.3 12.7  HCT 36.4 37.1  MCV 95.3 94.2  PLT 170 143*   Basic Metabolic Panel:  Recent Labs Lab 12/09/15 1321 11/22/2015 0835  NA 130* 133*  K 4.9 5.2*  CL 96* 100*  CO2 25 23  GLUCOSE 203* 145*  BUN 18 16  CREATININE 1.26* 1.15*  CALCIUM 8.6* 8.4*   GFR: Estimated Creatinine Clearance: 26.9 mL/min (by C-G formula based on SCr of 1.15 mg/dL (H)). Liver Function Tests:  Recent Labs Lab 12-09-2015 1321  AST 53*  ALT 28  ALKPHOS 77  BILITOT 0.8  PROT 6.8  ALBUMIN 3.5    Recent Labs Lab 12/09/2015 1321  LIPASE 21    No results for input(s): AMMONIA in the last 168 hours. Coagulation Profile: No results for input(s): INR, PROTIME in the last 168 hours. Cardiac Enzymes:  Recent Labs Lab 12-09-2015 1846 11/24/2015 0013 11/20/2015 0835  TROPONINI 0.11* 0.11* 0.11*   BNP (last 3 results) No results for input(s): PROBNP in the last 8760 hours. HbA1C: No results for input(s): HGBA1C in the last 72 hours. CBG:  Recent Labs Lab 09-Dec-2015 2201 11/18/2015 0728 11/12/2015 1137  GLUCAP 116* 129* 161*   Lipid Profile: No results for input(s): CHOL, HDL, LDLCALC, TRIG, CHOLHDL, LDLDIRECT in the last 72 hours. Thyroid Function Tests: No results for input(s): TSH, T4TOTAL, FREET4, T3FREE, THYROIDAB in the last 72 hours. Anemia Panel: No results for input(s): VITAMINB12, FOLATE, FERRITIN, TIBC, IRON, RETICCTPCT in the last 72 hours. Urine analysis:    Component Value Date/Time   COLORURINE ORANGE (A) Dec 09, 2015 1600   APPEARANCEUR CLEAR 12-09-15 1600   LABSPEC 1.018 2015/12/09 1600   PHURINE 6.0 12/09/15 1600   GLUCOSEU NEGATIVE 12/09/15 1600   HGBUR NEGATIVE 12-09-15 1600   BILIRUBINUR SMALL (A) 12/09/2015 1600   KETONESUR 15 (A) 2015-12-09 1600   PROTEINUR NEGATIVE 12/09/15 1600   UROBILINOGEN 0.2 04/28/2012 1251   NITRITE POSITIVE (A) December 09, 2015 1600   LEUKOCYTESUR MODERATE (A) 2015-12-09 1600     )No results found for this or any previous visit (from the past 240 hour(s)).    Anti-infectives    Start     Dose/Rate Route Frequency Ordered Stop   11/14/2015 1500  cefTRIAXone (ROCEPHIN) 1 g in dextrose 5 % 50 mL IVPB     1 g 100 mL/hr over 30 Minutes Intravenous Every 24 hours 12/01/2015 1424     12/09/2015 1500  azithromycin (ZITHROMAX) 500 mg in dextrose 5 % 250 mL IVPB     500 mg 250 mL/hr over 60 Minutes Intravenous Every 24 hours 11/11/2015 1424     2015-12-09 2100  cefTRIAXone (ROCEPHIN) 1 g in dextrose 5 % 50 mL IVPB     1 g 100 mL/hr over 30 Minutes Intravenous  Once December 09, 2015 1859  Dec 09, 2015 2345       Radiology Studies: X-ray Chest Pa And Lateral  Result Date: 12/05/2015 CLINICAL DATA:  Shortness of breath. EXAM: CHEST  2 VIEW COMPARISON:  12/09/15. FINDINGS: Cardiac pacer in stable position. Prior CABG. Cardiomegaly with mild pulmonary venous congestion. Low lung volumes. Small right pleural effusion cannot be excluded. No pneumothorax. IMPRESSION: 1. Cardiac pacer stable position. Prior CABG. Cardiomegaly with mild pulmonary venous congestion. Small right pleural effusion cannot be excluded. 2.  Low lung volumes. Electronically Signed   By: Maisie Fus  Register   On: 12/11/2015 06:47   Dg Chest 2 View  Result  Date: 2015-09-18 CLINICAL DATA:  Shortness of breath EXAM: CHEST  2 VIEW COMPARISON:  May 26, 2015 FINDINGS: A 2 lead pacemaker is again identified. The pacemaker generator obscures the right hemidiaphragm. No pneumothorax. The cardiomediastinal silhouette is stable with cardiomegaly. No focal infiltrates identified. IMPRESSION: No acute abnormalities identified. Electronically Signed   By: Gerome Samavid  Williams III M.D   On: 2015-09-18 14:14   Dg Chest Port 1 View  Result Date: 11/24/2015 CLINICAL DATA:  Abnormal lung sounds on physical examination. Hypertension. EXAM: PORTABLE CHEST 1 VIEW COMPARISON:  November 25, 2015 FINDINGS: There is patchy airspace consolidation in the left base. There is mild atelectasis in the right base. There is cardiomegaly with pulmonary venous hypertension. There is a small right pleural effusion. There is atherosclerotic calcification in the aorta. Pacemaker leads are attached to the right atrium and right ventricle. No adenopathy. No bone lesions. IMPRESSION: Evidence of pulmonary vascular congestion. Airspace consolidation in the left base is concerning for superimposed pneumonia. There is aortic atherosclerosis. Cardiac silhouette is stable compared to 1 day prior. Pacemaker leads are attached to right atrium and right ventricle.  Electronically Signed   By: Bretta BangWilliam  Woodruff III M.D.   On: 11/13/2015 07:24        Scheduled Meds: . amLODipine  2.5 mg Oral Daily  . atorvastatin  20 mg Oral Daily  . azithromycin  500 mg Intravenous Q24H  . cefTRIAXone (ROCEPHIN)  IV  1 g Intravenous Q24H  . clopidogrel  75 mg Oral Daily  . [START ON 11/27/2015] enoxaparin (LOVENOX) injection  30 mg Subcutaneous Daily  . insulin aspart  0-15 Units Subcutaneous TID WC  . ipratropium-albuterol  3 mL Nebulization BID  . isosorbide mononitrate  60 mg Oral Daily  . levothyroxine  50 mcg Oral QAC breakfast  . methylPREDNISolone (SOLU-MEDROL) injection  40 mg Intravenous Q12H  . metoprolol succinate  100 mg Oral Daily  . [START ON 11/27/2015] potassium chloride SA  20 mEq Oral Daily  . ranolazine  1,000 mg Oral BID   Continuous Infusions:    LOS: 0 days    Time spent: 25 min    Jakylan Ron Juanetta GoslingU Kemontae Dunklee, DO [facility] Pager [phone]  If 7PM-7AM, please contact night-coverage [email or web address] Password [email or web address] 11/28/2015, 2:31 PM

## 2015-12-12 NOTE — Progress Notes (Signed)
RN paged on call NP, and Attending MD, informing that patient is deceased

## 2015-12-12 NOTE — Progress Notes (Signed)
After RN administered 2nd breathing ts this afternoon, family called RN back into the patients room, and stated she was not breathing. Upon RN arrival pt had increased work of breathing. Rn called MD, and place pt on a venti mask. Pt sats 87%, B/Ps in the 60s. Pt also placed on a NRB, and shortly after Respiratory came and placed pt on Bipap, and also obtained an ABG. During this time the patient family was present, and MD came to bedside to assess the patient and update the family. Pt became more arousable. 1x dose of 40mg  IV lasix was given to patient, and a foley catheter was placed.

## 2015-12-12 DEATH — deceased

## 2015-12-19 ENCOUNTER — Encounter: Payer: Medicare Other | Admitting: Nurse Practitioner

## 2016-01-09 ENCOUNTER — Ambulatory Visit: Payer: Medicare Other | Admitting: Cardiovascular Disease

## 2016-02-19 ENCOUNTER — Ambulatory Visit: Payer: Medicare Other | Admitting: Podiatry

## 2017-03-25 IMAGING — CT CT ANGIO CHEST
1 of 9 series · 17 of 36 positions shown · IV contrast (Iohexol (Omnipaque 350))
Comparison: Radiographs 05/26/2015

CLINICAL DATA: Elevated D-dimer

EXAM:
CT ANGIOGRAPHY CHEST WITH CONTRAST
TECHNIQUE: Multidetector CT imaging of the chest was performed using the
standard protocol during bolus administration of intravenous
contrast. Multiplanar CT image reconstructions and MIPs were
obtained to evaluate the vascular anatomy.
CONTRAST:  80 mL Isovue 370 intravenous

[Series 506: thins pacs · axial · 0.71mm/px · z∈[+205,+375]mm · 17 of 192 slices shown]
[im 11/192  lung]
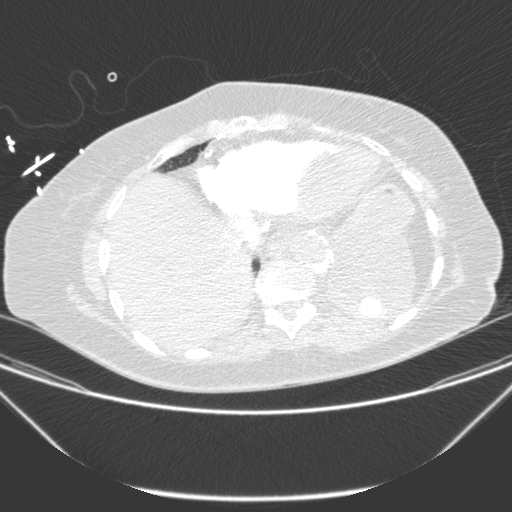
[im 22/192  mediastinal]
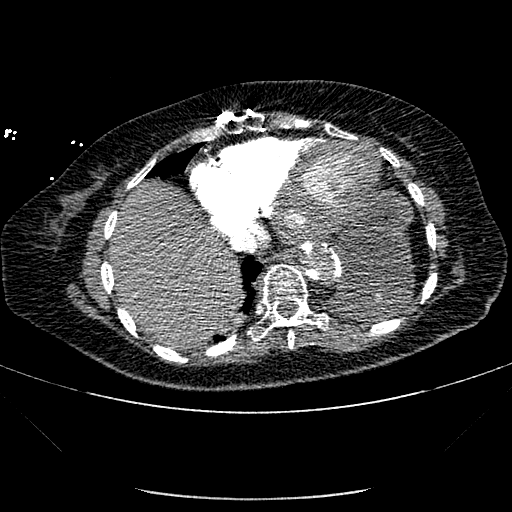
[im 32/192  lung]
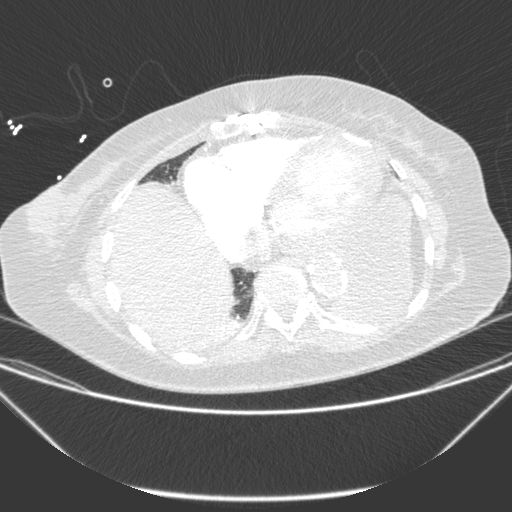
[im 43/192  mediastinal]
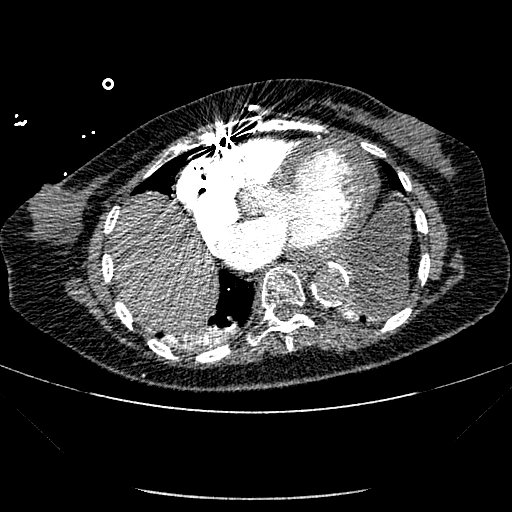
[im 54/192  lung]
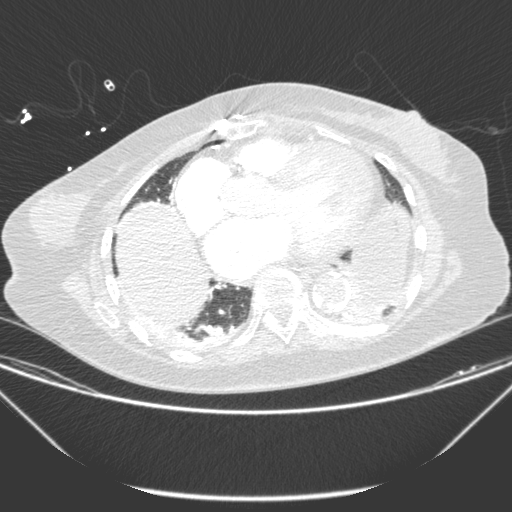
[im 64/192  mediastinal]
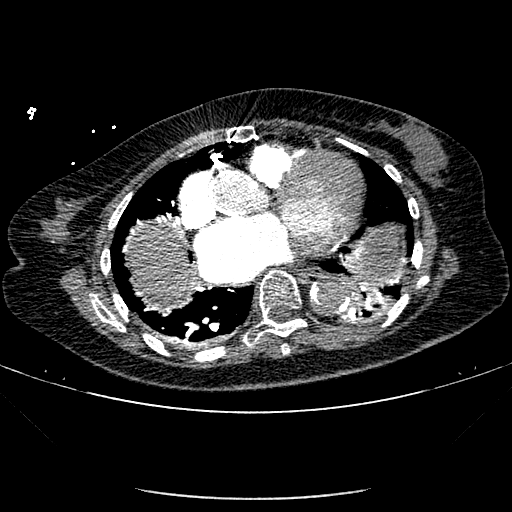
[im 75/192  lung]
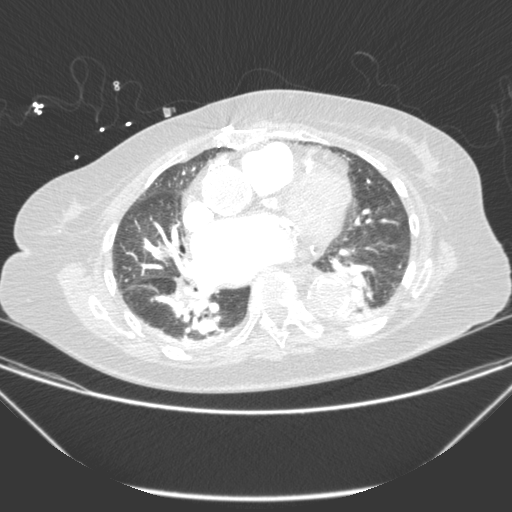
[im 85/192  mediastinal]
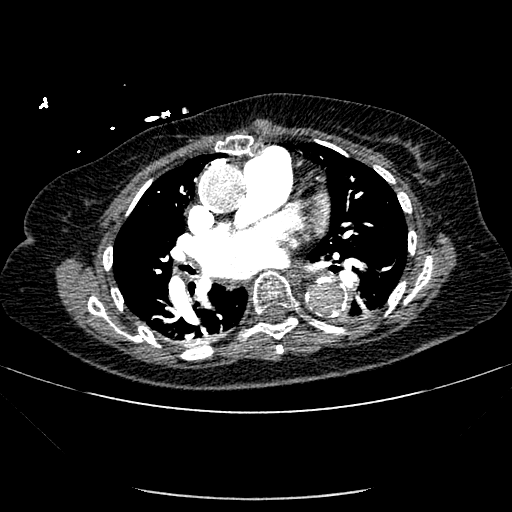
[im 96/192  lung]
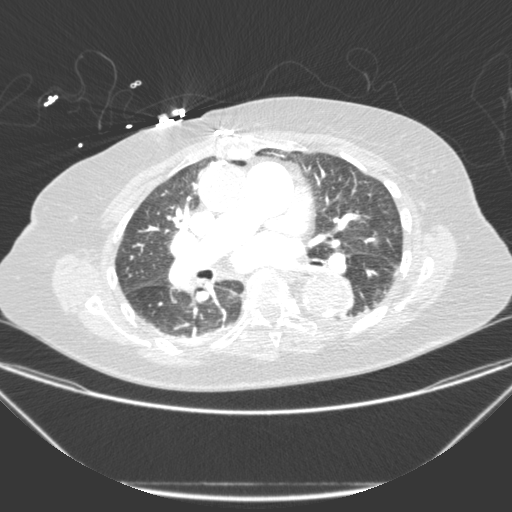
[im 107/192  mediastinal]
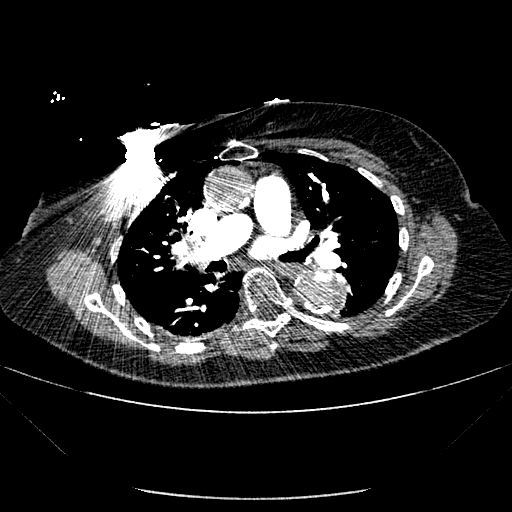
[im 117/192  lung]
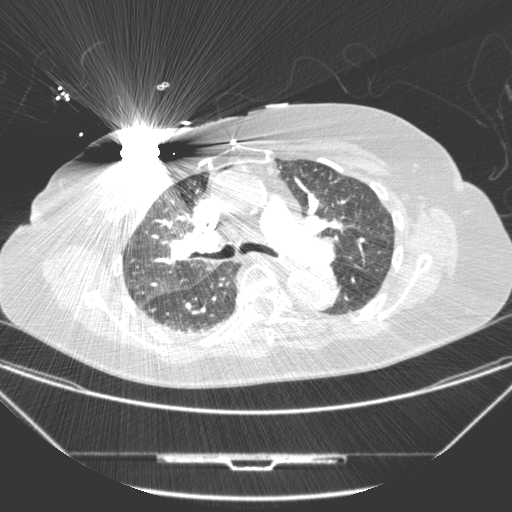
[im 128/192  mediastinal]
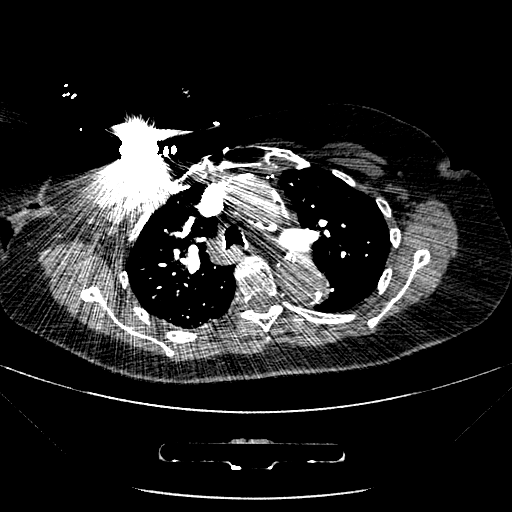
[im 138/192  lung]
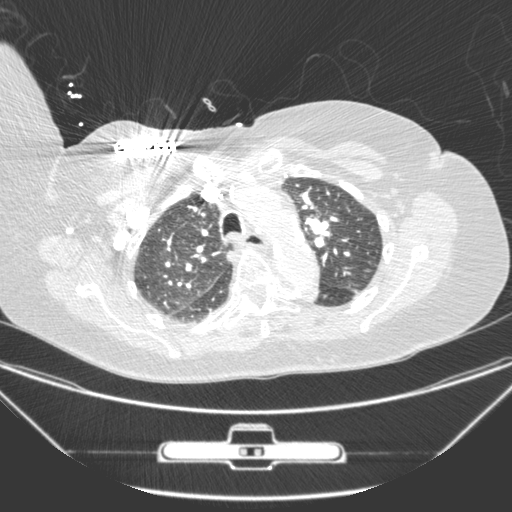
[im 149/192  mediastinal]
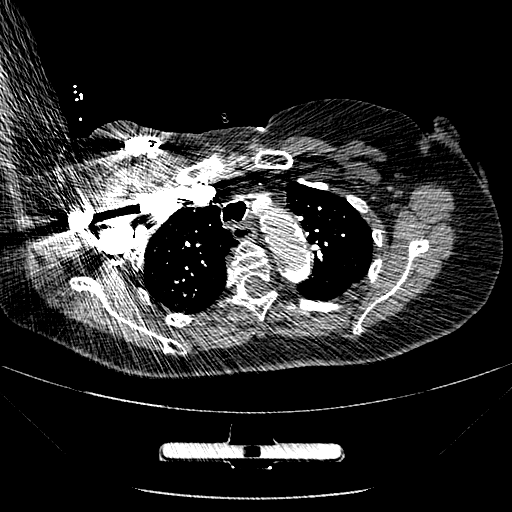
[im 160/192  lung]
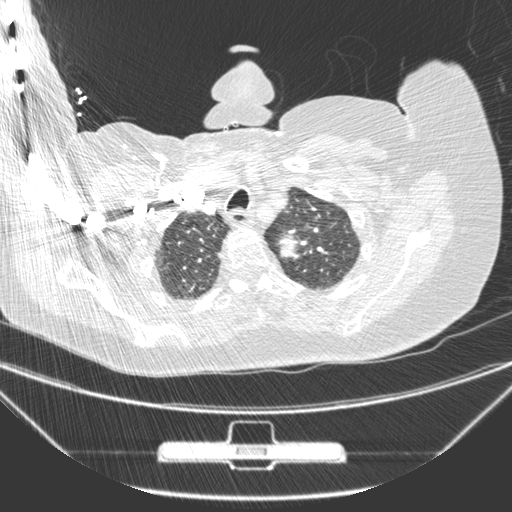
[im 170/192  mediastinal]
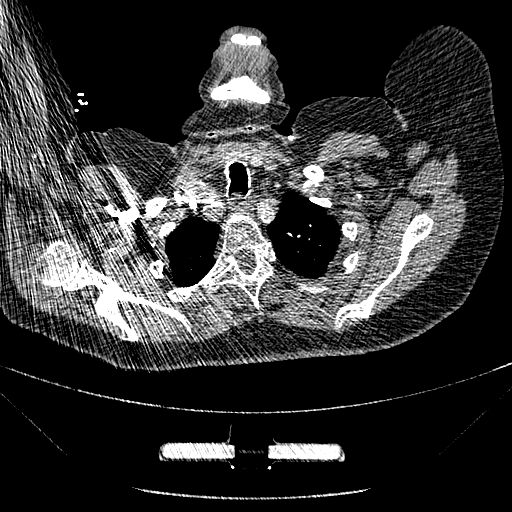
[im 181/192  lung]
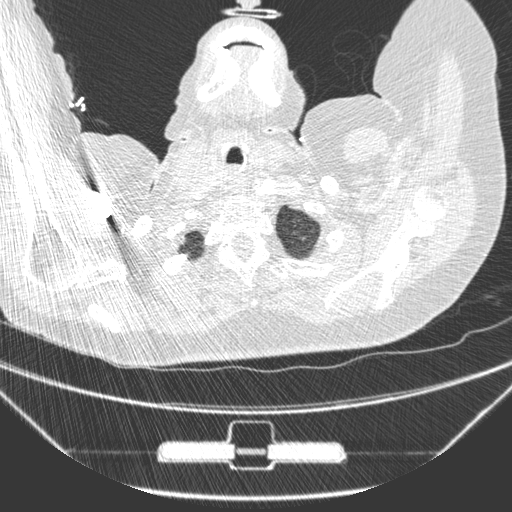

[17 of 36 positions shown; findings below may reference images not displayed]

FINDINGS: Cardiovascular: There is good opacification of the pulmonary
arteries. There is no pulmonary embolism. The thoracic aorta is
normal in caliber and intact.

Lungs: Clear

Central airways: Patent

Effusions: None

Lymphadenopathy: None

Esophagus: Unremarkable

Upper abdomen: Unremarkable

Musculoskeletal: No significant abnormality.

Review of the MIP images confirms the above findings.
IMPRESSION: Negative for acute pulmonary embolism. No significant abnormality in
the chest.
